# Patient Record
Sex: Female | Born: 1960 | Race: White | Hispanic: No | Marital: Married | Smoking: Never smoker
Health system: Southern US, Community
[De-identification: ages and names within clinical notes are randomized; demographics above are authoritative.]

## PROBLEM LIST (undated history)

## (undated) DIAGNOSIS — J45909 Unspecified asthma, uncomplicated: Secondary | ICD-10-CM

## (undated) DIAGNOSIS — K219 Gastro-esophageal reflux disease without esophagitis: Secondary | ICD-10-CM

## (undated) DIAGNOSIS — F419 Anxiety disorder, unspecified: Secondary | ICD-10-CM

## (undated) DIAGNOSIS — T7840XA Allergy, unspecified, initial encounter: Secondary | ICD-10-CM

## (undated) DIAGNOSIS — M1990 Unspecified osteoarthritis, unspecified site: Secondary | ICD-10-CM

## (undated) DIAGNOSIS — R002 Palpitations: Secondary | ICD-10-CM

## (undated) DIAGNOSIS — R519 Headache, unspecified: Secondary | ICD-10-CM

## (undated) DIAGNOSIS — M810 Age-related osteoporosis without current pathological fracture: Secondary | ICD-10-CM

## (undated) DIAGNOSIS — R42 Dizziness and giddiness: Secondary | ICD-10-CM

## (undated) DIAGNOSIS — I471 Supraventricular tachycardia, unspecified: Secondary | ICD-10-CM

## (undated) DIAGNOSIS — K589 Irritable bowel syndrome without diarrhea: Secondary | ICD-10-CM

## (undated) DIAGNOSIS — I10 Essential (primary) hypertension: Secondary | ICD-10-CM

## (undated) DIAGNOSIS — M419 Scoliosis, unspecified: Secondary | ICD-10-CM

## (undated) DIAGNOSIS — K5790 Diverticulosis of intestine, part unspecified, without perforation or abscess without bleeding: Secondary | ICD-10-CM

## (undated) DIAGNOSIS — R51 Headache: Secondary | ICD-10-CM

## (undated) HISTORY — DX: Gastro-esophageal reflux disease without esophagitis: K21.9

## (undated) HISTORY — DX: Dizziness and giddiness: R42

## (undated) HISTORY — DX: Unspecified asthma, uncomplicated: J45.909

## (undated) HISTORY — DX: Palpitations: R00.2

## (undated) HISTORY — DX: Age-related osteoporosis without current pathological fracture: M81.0

## (undated) HISTORY — PX: SHOULDER ARTHROSCOPY: SHX128

## (undated) HISTORY — DX: Headache: R51

## (undated) HISTORY — PX: HERNIA REPAIR: SHX51

## (undated) HISTORY — PX: TONSILLECTOMY: SUR1361

## (undated) HISTORY — DX: Scoliosis, unspecified: M41.9

## (undated) HISTORY — DX: Diverticulosis of intestine, part unspecified, without perforation or abscess without bleeding: K57.90

## (undated) HISTORY — DX: Irritable bowel syndrome, unspecified: K58.9

## (undated) HISTORY — DX: Supraventricular tachycardia: I47.1

## (undated) HISTORY — DX: Supraventricular tachycardia, unspecified: I47.10

## (undated) HISTORY — PX: COLONOSCOPY: SHX174

## (undated) HISTORY — DX: Essential (primary) hypertension: I10

## (undated) HISTORY — DX: Allergy, unspecified, initial encounter: T78.40XA

## (undated) HISTORY — DX: Unspecified osteoarthritis, unspecified site: M19.90

## (undated) HISTORY — PX: OTHER SURGICAL HISTORY: SHX169

## (undated) HISTORY — DX: Headache, unspecified: R51.9

## (undated) SURGERY — ESOPHAGOGASTRODUODENOSCOPY (EGD) WITH PROPOFOL
Anesthesia: Monitor Anesthesia Care

---

## 1992-09-08 HISTORY — PX: VEIN SURGERY: SHX48

## 1998-11-05 ENCOUNTER — Other Ambulatory Visit: Admission: RE | Admit: 1998-11-05 | Discharge: 1998-11-05 | Payer: Self-pay | Admitting: Obstetrics and Gynecology

## 1999-11-08 ENCOUNTER — Other Ambulatory Visit: Admission: RE | Admit: 1999-11-08 | Discharge: 1999-11-08 | Payer: Self-pay | Admitting: Obstetrics and Gynecology

## 2000-03-30 ENCOUNTER — Encounter: Admission: RE | Admit: 2000-03-30 | Discharge: 2000-06-28 | Payer: Self-pay | Admitting: Anesthesiology

## 2001-01-04 ENCOUNTER — Other Ambulatory Visit: Admission: RE | Admit: 2001-01-04 | Discharge: 2001-01-04 | Payer: Self-pay | Admitting: Obstetrics and Gynecology

## 2001-09-05 ENCOUNTER — Emergency Department (HOSPITAL_COMMUNITY): Admission: EM | Admit: 2001-09-05 | Discharge: 2001-09-05 | Payer: Self-pay | Admitting: *Deleted

## 2002-01-06 ENCOUNTER — Other Ambulatory Visit: Admission: RE | Admit: 2002-01-06 | Discharge: 2002-01-06 | Payer: Self-pay | Admitting: Obstetrics and Gynecology

## 2003-01-13 ENCOUNTER — Other Ambulatory Visit: Admission: RE | Admit: 2003-01-13 | Discharge: 2003-01-13 | Payer: Self-pay | Admitting: Obstetrics and Gynecology

## 2003-12-27 ENCOUNTER — Other Ambulatory Visit: Admission: RE | Admit: 2003-12-27 | Discharge: 2003-12-27 | Payer: Self-pay | Admitting: Obstetrics and Gynecology

## 2004-01-03 ENCOUNTER — Encounter: Admission: RE | Admit: 2004-01-03 | Discharge: 2004-01-03 | Payer: Self-pay | Admitting: Obstetrics and Gynecology

## 2004-04-09 ENCOUNTER — Ambulatory Visit (HOSPITAL_COMMUNITY): Admission: RE | Admit: 2004-04-09 | Discharge: 2004-04-09 | Payer: Self-pay | Admitting: Family Medicine

## 2004-04-25 ENCOUNTER — Ambulatory Visit (HOSPITAL_COMMUNITY): Admission: RE | Admit: 2004-04-25 | Discharge: 2004-04-25 | Payer: Self-pay | Admitting: Internal Medicine

## 2005-02-18 ENCOUNTER — Other Ambulatory Visit: Admission: RE | Admit: 2005-02-18 | Discharge: 2005-02-18 | Payer: Self-pay | Admitting: Obstetrics and Gynecology

## 2006-01-11 ENCOUNTER — Encounter: Admission: RE | Admit: 2006-01-11 | Discharge: 2006-01-11 | Payer: Self-pay | Admitting: Neurosurgery

## 2006-11-20 ENCOUNTER — Encounter: Admission: RE | Admit: 2006-11-20 | Discharge: 2006-11-20 | Payer: Self-pay | Admitting: Orthopedic Surgery

## 2007-06-18 ENCOUNTER — Encounter (HOSPITAL_COMMUNITY): Admission: RE | Admit: 2007-06-18 | Discharge: 2007-07-18 | Payer: Self-pay | Admitting: Orthopaedic Surgery

## 2007-10-08 ENCOUNTER — Ambulatory Visit: Payer: Self-pay | Admitting: Internal Medicine

## 2011-01-21 NOTE — Letter (Signed)
October 08, 2007    W. Simone Curia, M.D.  [address]  [place], [place] [address]   RE:  Victoria [name], Victoria [name]  MRN:  [id number]  /  DOB:  1961/03/07   Dear Brett Canales:   Thank you for referring Victoria [name] down for EP evaluation of her very  rapid SVT.  As you know, she is very pleasant 50 year old woman with an  8-year history of tachy palpitations and documented SVT now at rate of  up to 216 beats per minute.  She notes that when she was in SVT, she  gets dizzy and lightheaded and has nearly passed out on several  occasions though has never had frank syncope.  She notes chest pain,  shoulder pain, neck pain and shortness of breath with her episodes.  She  has not been on medical therapy to date and she is here today for  additional evaluation. Her exam was fairly unremarkable.  EKG did not  demonstrate any evidence of WPW syndrome.  With all the above I have  discussed treatment options with the patient and have recommended  initial trial of beta blockers and have given her prescription today for  atenolol.  I will plan to see her back in a couple of months for follow-  up to see how she is tolerating her beta blocker therapy and see whether  her SVT has been well controlled.  Ultimately she will be a good,  candidate for catheter ablation but has been trying medical therapy  initially would make the most sense.  Thanks again for referring her.    Sincerely,      Doylene Canning. Ladona Ridgel, MD  Electronically Signed   GWT/MedQ  DD: 10/08/2007  DT: 10/08/2007  Job #: [id number]

## 2011-01-21 NOTE — Assessment & Plan Note (Signed)
[facility]                         ELECTROPHYSIOLOGY OFFICE NOTE   Victoria [name], Victoria [name]                        MRN:          [id number]  DATE:10/08/2007                            DOB:          August 23, 1961    REFERRING PHYSICIAN:  Donna Bernard, M.D.   Victoria [name] is referred today by Dr. Lubertha South for evaluation of very  rapid SVT which has been symptomatic and recurrent.   HISTORY OF PRESENT ILLNESS:  The patient is a very pleasant 50 year old  woman who has a history of tachy palpitations dating back many years.  She actually Dr. Juanito Doom back in 1999 regarding these.  It was unclear  what the etiology of palpitations was at that time though it was thought  to be SVT.  The patient, however, over the last year has had  increasingly frequent episodes typically three to four per year.  Most  most recently, she was seen by the paramedics and was back to be given  adenosine.  She was in SVT at almost 260 beats per minute.  Her episode  spontaneously stopped.  She returns today for additional evaluation.  The patient notes that when she goes into SVT, these episodes typically  last anywhere from 5 minutes to as long as an hour and a half and are  associated with near syncope as well as chest pressure and shortness of  breath.  She feels very poorly after the episodes.  She is not taking  any medications so far.  She notes that she occasionally she has been  able to get them to stop with vagal maneuvers.  Additional past medical  history is notable for tonsillectomy in the remote past. She had  abdominal hernia repair in 1978, right shoulder surgery in 2008.   FAMILY HISTORY:  Is notable for both parents being alive and no specific  heart problems.   MEDICATIONS:  Include Flexeril, Prilosec and ibuprofen.   SOCIAL HISTORY:  The patient is married.  She does not work outside the  home.  She denies tobacco use.  She drinks one cup of caffeinated  beverage per day.  She walks on a regular basis and notes that since she  started walking her palpitations have become less severe.   REVIEW OF SYSTEMS:  Is as noted in the HPI, otherwise all systems  reviewed and negative except for very minimal amount of arthritis and  some GE reflux.   PHYSICAL EXAM:  She is a pleasant well-appearing 50 year old woman in no  acute distress.  Blood pressure was 130/76, the pulse was 88 and  regular, respirations were 18.  The weight was not recorded though she  states she weighs 130 pounds.  HEENT:  Normocephalic and atraumatic.  Pupils equal and round.  Oropharynx moist.  Sclerae anicteric.  NECK:  Revealed no jugular venous distention.  There are no thyromegaly.  Trachea midline.  Carotids are  2+ symmetric.  LUNGS:  Clear bilaterally to auscultation.  No wheezes, rales or rhonchi  are present.  No increased work of breathing.  CARDIOVASCULAR:  Regular rate and  rhythm with normal S1-S2, no murmurs,  rubs, gallops.  PMI was not enlarged nor laterally displaced.  ABDOMINAL:  Soft, nondistended, nontender.  There is no organomegaly.  Bowel sounds present, no rebound or guarding.  EXTREMITIES:  Demonstrated no cyanosis, clubbing or edema.  Pulses were  2+ and symmetric.  NEURO:  Neurologic exam alert and oriented x3, cranial nerves intact.  Strength was 5/5 and symmetric.  SKIN:  Exam was normal.   The EKG demonstrates sinus rhythm with normal axis intervals.  There is  no pre-excitation noted.   IMPRESSION:  Recurrent SVT   DISCUSSION:  The patient had extensive numbers of questions regarding  her SVT and its prognosis.  The risks, benefits, goals and expectations  of catheter ablation were discussed as well as an initial trial of beta-  blockers.  While she is anxious about taking beta blockers, stating that  her mother had problems with these, I have recommend that we try medical  therapy first.  However, with a careful explanation of the  risk of the  of ablation were also was also made to her.  She will consider her  options.     Doylene Canning. Ladona Ridgel, MD  Electronically Signed    GWT/MedQ  DD: 10/08/2007  DT: 10/08/2007  Job #: [id number]   cc:   Donna Bernard, M.D.

## 2011-01-24 NOTE — Procedures (Signed)
[facility]  Patient:    Victoria [name], Victoria [name]                          MRN: [id number] Proc. Date: 04/17/00 Adm. Date:  [id number] Attending:  Thyra Breed CC:         Harvie Junior, M.D.   Procedure Report  PROCEDURE PERFORMED:  Lumbar epidural steroid injection.  DIAGNOSIS:  Lumbar degenerative disk disease with lumbar radiculopathy on the basis of underlying herniated disk at L5-S1.  BRIEF HISTORY:  The patient notes overall improvement.  She has developed a twinge or discomfort in her upper interscapular region and some right leg discomfort, but she is able to stand on her toes which she could not do prior to the epidural.  She feels as though she is at least 95% plus better.  PHYSICAL EXAMINATION:  VITAL SIGNS:  Blood pressure 118/71, heart rate 95, respiratory rate 16 and O2 saturation 99%.  Temperature is 97.1.  Pain level is 3/10.  NEUROLOGIC:  Neuro exam is grossly unchanged.  DESCRIPTION OF PROCEDURE:  After informed consent was obtained, the patient was placed in the sitting position and monitored.  Her back was prepped with Betadine x 3.  A skin wheel was raised at the L4-5 interspace with 1% lidocaine.  A 20-gauge Tuohy needle was introduced to the lumbar epidural space with loss of resistance to preservative-free normal saline.  There was no CSF nor blood.  Medrol 80 mg and 10 ml of preservative-free normal saline was gently injected.  The needle was flushed with preservative-free normal saline and removed intact.  Postprocedure condition is stable.  DISCHARGE INSTRUCTIONS: 1. Resume previous diet. 2. Limitation activities per instruction sheet. 3. Continue on current medications. 4. The patient plans to follow up with Dr. Luiz Blare. DD:  04/17/00 TD:  04/18/00 Job: [id number]

## 2011-01-24 NOTE — Op Note (Signed)
NAME:  Victoria [name], Victoria [name]                           ACCOUNT NO.:  [id number]   MEDICAL RECORD NO.:  [id number]                   PATIENT TYPE:  AMB   LOCATION:  DAY                                  FACILITY:  [facility]   PHYSICIAN:  Lionel December, M.D.                 DATE OF BIRTH:  1960/11/24   DATE OF PROCEDURE:  04/25/2004  DATE OF DISCHARGE:                                 OPERATIVE REPORT   PROCEDURE:  Esophagogastroduodenoscopy.   INDICATIONS:  [name] is a 50 year old Caucasian female who has chronic  heartburn which has responded well to PPI. She also had an episode of melena  recently. Therefore, EGD was advised.   Procedure and risks were reviewed with the patient and informed consent was  obtained.   PREOPERATIVE MEDICATIONS:  Cetacaine spray for pharyngeal topical  anesthesia, fentanyl 50 mg IV, Versed 14 mg in divided doses.   FINDINGS:  Procedure performed in endoscopy suite. The patient's vital signs  and O2 saturations were monitored during the procedure and remained stable.  The patient was placed in left lateral position, and Olympus video scope was  passed via oropharynx without any difficulty into the esophagus.   Esophagus:  Mucosa of the esophagus was normal. There was a noncritical ring  about 3 cm proximal to the GE junction. This was left alone as she does have  any dysphagia. There was no ring or stricture at GE junction. No hernia was  noted.   Stomach:  It was empty and distended very well with insufflation. Folds in  the proximal stomach were normal. Examination of mucosa at body, antrum,  pyloric channel, as well as angularis, fundus, and cardia was normal.   Duodenum:  Examined the bulb. The bulb reveals normal mucosa. Scope was  passed to the second part of the duodenum. Mucosa and folds were normal.  Endoscope was withdrawn. The patient tolerated the procedure well.   FINAL DIAGNOSES:  Noncritical distal esophageal ring which was not  manipulated as  patient does not have dysphagia. The rest of the  esophagogastroduodenoscopy was normal.   RECOMMENDATIONS:  1. She will continue anti-reflux measures and Prilosec as before.  2. Should she have relapse of her diarrhea or left sided abdominal pain, she     would give [place] a call.      ___________________________________________                                            Lionel December, M.D.   NR/MEDQ  D:  04/25/2004  T:  04/25/2004  Job:  [id number]   cc:   Donna Bernard, M.D.  [address]  [place]  [place] [address]  Fax: [phone]

## 2011-01-24 NOTE — Consult Note (Signed)
NAME:  Victoria [name], Victoria [name]                           ACCOUNT NO.:  [id number]   MEDICAL RECORD NO.:  [id number]                   PATIENT TYPE:  OUT   LOCATION:  RAD                                  FACILITY:  [facility]   PHYSICIAN:  Victoria [name], M.D.                 DATE OF BIRTH:  08-Jul-1961   DATE OF CONSULTATION:  04/16/2004  DATE OF DISCHARGE:  04/09/2004                                   CONSULTATION   CONSULTING PHYSICIAN:  Victoria [name], M.D.   REFERRING PHYSICIAN:  Donna [name], M.D.   REASON FOR CONSULTATION:  Lower abdominal pain, bloody diarrhea, and a  recent episode of melena.   HISTORY OF PRESENT ILLNESS:  Victoria [name] is a 50 year old Caucasian female who was  referred through the courtesy of Ms. Victoria [name], F.N.P. at Dr. Enzo Bi [name]'s office.  She was in her usual state of health until eight  days ago.  On the morning of April 08, 2004, she had normal but black stool.  That evening around 8 p.m. she experienced a sharp pain in her right lower  quadrant.  Thirty minutes later, she felt she had an upset stomach.  She had  lower abdominal cramps, and she felt like she had to go to the bathroom.  Her bowels finally moved.  She passed a couple of soft, semi-soft stools.  Her pain became even more intense and described to be contractions.  She  also felt abdominal swelling, pain, and then started to have frank bleeding.  She passed fresh blood and tissue.  She has had multiple bowel movements  during the night.  She had some nausea but no vomiting, fever, or chills.  The next morning, she was seen by Ms. Victoria [name].  She had lab  studies.  Her WBC was mildly elevated at 12.6, H&H were around 13 and 45.2,  platelet count was 284.  Her metabolic-7 was normal.  She was sent over to  the hospital for abdominal pelvic CT.  She had bowel wall thickening  involving the descending colon with slight inflammatory change within the  pericolonic fat.  No diverticula were seen  in this area.  She also had an  incidental finding of small left renal calculus.  She was begun on Cipro,  Levsin, and also given Darvocet-N 100 for pain, and this appointment was  made.  The patient states that over the last few days she has been gradually  getting better.  She has not seen any blood in her stool in about 5-6 days.  The stools are now mushy.  She still has __________  cramps in her lower  abdomen mainly on the left side.  She has been on liquid and/or bland diet.  There is no history of broad antibiotic use or travel abroad.  She gives a  several year history of heartburn which had been gradually getting worse.  She experienced intractable heartburn during both her pregnancies.  About  eight months ago, she was begun on a PPI and has noted significant  improvement.  She used to have dysphagia but this has resolved with acid  suppression.  She has not lost any weight recently.   MEDICATIONS:  1. Cipro 500 mg b.i.d.  2. Levsin/SL q.i.d. p.r.n.  3. Flexeril 10 mg q.h.s.  4. Darvocet-N 100 t.i.d. p.r.n.  5. Prilosec 20 mg p.o. q.a.m.   PAST MEDICAL HISTORY:  1. GERD as above.  2. She had repair of an umbilical hernia when she was 39 or 53-years-old.  3. History of low back pain.  Four years ago she had severe pain radiating     to her left lower extremity.  She lost reflexes.  She had disk     herniation.  She received three epidural injections with improvement.  4. Two years ago she began to have neck pain and was diagnosed with cervical     spondylosis.   ALLERGIES:  None known.   FAMILY HISTORY:  Both parents are in good health.  Father has chronic GERD.  She has one adopted sister who is doing fine.   SOCIAL HISTORY:  She is married.  She has two healthy children.  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  She has never smoked cigarettes or drank alcohol.   PHYSICAL EXAMINATION:  GENERAL:  A pleasant,  well-developed, well-nourished,  Caucasian female who is in no acute distress.  VITAL SIGNS:  She weighs 134.5 pounds.  She is 5 feet 4 inches tall.  Pulse  92 per minute, blood pressure 110/80.  HEENT:  Conjunctivae is pink.  Sclerae is nonicteric.  Oropharyngeal mucosa  is normal.  NECK:  Without masses or thyromegaly.  CARDIAC:  Regular rhythm.  Normal S1, S2.  No murmur or gallop noted.  LUNGS:  Clear to auscultation.  ABDOMEN:  Full.  Bowel sounds are normal.  Palpation reveals a soft abdomen  with mild tenderness in the hypogastric area and LLQ.  No organomegaly or  masses noted.  RECTAL:  Deferred as she had one by Ms. Hoskins recently.  EXTREMITIES:  No clubbing or edema noted.   LABS:  As above.   ASSESSMENT:  [name] has two gastrointestinal issues.  1. Recent onset of crampy abdominal pain with bloody diarrhea.  She has     improved with the Cipro.  A CT suggests thickening to the left or     descending colon.  She does not have any risk factors for ischemic     colitis.  It would either appear to be nonspecific or more likely an     infectious or toxic colitis.  If she continues to improve, she may not     even need further evaluation.  It would be nice to at least have a     definite diagnosis and stool studies might help.  2. Chronic gastroesophageal reflux disease.  She had an episode of tarry     stools several hours prior to onset of her lower abdominal pain and     bloody diarrhea, and I feel that this is a separate issue.  Given that     she has had heartburn for years, EGD would be warranted.   RECOMMENDATIONS:  1. She will go and finish up her Cipro and stay on Levsin.  She can  gradually advance her diet back to a regular diet.  2. Stool studies for WBC and O&P.  Hopefully, she can take samples to the     hospital today or tomorrow morning.  3. She will be scheduled for esophagogastroduodenoscopy next week.  Whether    or not she would need a colonoscopy would  be determined after stool     studies are completed.   We would like to thank Ms. Victoria [name], F.N.P. and Dr. Simone [name]  for the opportunity to participate in the care of this nice lady.      ___________________________________________                                            Victoria [name], M.D.   NR/MEDQ  D:  04/16/2004  T:  04/16/2004  Job:  [id number]   cc:   Victoria [name], F.N.P.   Victoria [name], M.D.  [address]  [place]  [place] [address]  Fax: [phone]   day hospital at [facility]

## 2011-01-24 NOTE — Procedures (Signed)
[facility]  Patient:    Victoria [name]                           MRN: [id number] Proc. Date: 04/09/00 Adm. Date:  [id number] Attending:  Thyra Breed CC:         Harvie Junior, M.D.   Procedure Report  PROCEDURE:  Lumbar epidural steroid injection.  DIAGNOSIS:  Lumbar degenerative disk disease with lumbar radiculopathy on the basis of of herniated disk at L5-S1.  INTERVAL HISTORY:  The patient noted marked improvement overall. She feels very positive about her current situation. She is getting much less discomfort radiating out into her lower extremities.  PHYSICAL EXAMINATION:  VITAL SIGNS:  Blood pressure is 136/75, heart rate is 87, respiratory rate 18, O2 sat was 99%, pain level is 3/10.  NEUROLOGIC:  Unchanged from previously.  DESCRIPTION OF PROCEDURE:  After informed consent was obtained, the patient was placed in the sitting position and monitored. The patients back was prepped with Betadine x 3. A skin wheal was raised at the L4-5 interspace with 1 percent lidocaine. A 20 gauge Tuohy needle was introduced to the lumbar epidural space to loss of resistance to preservative free normal saline. There was no cerebrospinal fluid nor blood. 80 mg of Medrol and 10 ml of preservative free normal saline was gently injected. The needle was flushed with preservative free normal saline and removed intact.  CONDITION POST PROCEDURE:  Stable.  DISCHARGE INSTRUCTIONS:  Resume previous diet. Limitations in activities per instruction sheet. Continue on current medications. Follow-up with me in 7-10 days for repeat injection. DD:  04/09/00 TD:  04/11/00 Job: [id number]

## 2011-01-24 NOTE — Procedures (Signed)
[facility]  Patient:    Victoria [name], Victoria [name]                          MRN: [id number] Proc. Date: 03/30/00 Adm. Date:  [id number] Attending:  Thyra Breed CC:         Harvie Junior, M.D.                           Procedure Report  DIAGNOSIS:  Lumbar degenerative disk disease with associated lumbar radiculopathy on the basis of a herniated disk at L5-S1.  HISTORY:  Victoria [name] is a 50 year old, who was sent to [place] by Dr. Jodi Geralds for a series of lumbar epidural steroid injections.  The patient states, that she was in her usual state of health up until about 4-6 weeks ago, when she was working in her garden and noted that she had the onset of severe lower back discomfort, which did not respond to the usual Celebrex and Skelaxin that she normally takes for her chronic cervical herniated disk.  It seemed quite out of the ordinary.  About 3-4 weeks into it, she was out spraying her roses and felt as though she could not get up.  Dr. Gerda Diss, her primary care physician went ahead and gave her a steroid dose pack.  Three days later, she noted pain radiating out into the left lower extremity along the posterior aspect of the thigh and lateral aspect of the calf.  She also is developing leg cramps.  She did not improve.  She saw Dr. Luiz Blare at Dr. Cathlyn Parsons request and she was MRId, which showed a herniated disk at L5-S1 and mild degenerative disk disease at multiple levels in the lumbar spine.  She was placed on a prednisone dose pack and she has noted improvement.  About two weeks ago, she had stopped physical therapy because her pain was getting worse.  At this point, she was seen by Dr. Jordan Likes, who advocated surgical intervention.  Dr. Luiz Blare went ahead and set her up for  epidurals.  She has progressively improved since that time.  She does have some persistent tingling, predominantly localized to the lateral aspect of the left knee and it has telescoped down  considerably.  At one point, her foot was numb on the plantar aspect.  She continues to have a sense of pulling in her hamstring whenever she twists, but sitting or standing, she has minimal discomfort.  She denied any bowel or bladder incontinence.  She has noted weakness and this seems to be improving.  She is able to do her activities of daily living, but does not feel quite healed yet.  CURRENT MEDICATIONS:  Celebrex, Skelaxin and Flexeril, which she takes p.r.n.  ALLERGIES:  No known drug allergies.  FAMILY HISTORY:  Positive for cancer, coronary artery disease, low-back pain.  PAST SURGICAL HISTORY:  Significant for saphenous vein ligation for varicosities and umbilical hernia repair.  SOCIAL HISTORY:  The patient is a homemaker.  She does not drink alcohol nor smoke cigarettes.  PAST MEDICAL HISTORY:  Significant for supraventricular tachycardia for which she is seeing Dr. Daleen Squibb and asthma, which is more of an exercise-induced asthma.  REVIEW OF SYSTEMS:  Negative for GENERAL, HEAD, EYES, NOSE/MOUTH/THROAT, EARS, PULMONARY, GU, HEMATOLOGIC, ENDOCRINE, CUTANEOUS, PSYCHIATRIC.  She does have a history of asthma in the past and SVT, hence the positive under PULMONARY AND CARDIOVASCULAR.  GI:  Significant  for episodic indigestion. MUSCULOSKELETAL/NEUROLOGIC:  See HPI.  No history of seizures or stroke. ALLERGY:   She is allergic to cats.  EXAMINATION:  VITAL SIGNS:  Blood pressure was 130/77, heart rate 92, respiratory rate 16, O2 saturations 98%, temperature was 98.1.  Pain level is 3 out of 10.  GENERAL:  This is a pleasant female in no acute distress.  She presents with her husband.  EYES:  Extraocular movements intact with conjunctiva and sclera clear.  NOSE:  Patent. Nares ______. Oropharynx was free of lesions.  NECK:  Supple without lymphadenopathy.  LUNGS:  Clear to auscultation and percussion.  HEART:  Regular rate and  rhythm.  BREAST/ABDOMINAL/PELVIC/RECTAL:  Not performed.  BACK EXAMINATION:  Revealed mildly positive straight leg raise sign on the left with no tenderness to percussion over the vertebra.  EXTREMITIES:  No clubbing, cyanosis, or edema with radial pulses and dorsalis pedis pulses 2+ and symmetric.  She did have some varicosities over the lower extremity.  NEUROLOGIC:  The patient was oriented x 4.  Cranial nerves two through 12 were grossly intact.  Deep tendon reflexes were symmetric in the upper extremities, symmetric at the knees, 1+ at the right ankle, absent at the left.  Sensory was significant for attenuated pin prick over the lateral aspect of the left calf posteriorly.  Motor was significant for mild plantar flexion weakness of the great toe.  Coordination was grossly intact.  An MRI showed the herniation as mentioned above.   IMPRESSION: 1. Herniated disk at L5-S1 with associated S1 radiculopathy, which seems to be    improving, but still symptomatic. 2. History of asthma, allergy to cats, supraventricular tachycardia per    primary care physician.  DISPOSITION:  I discussed the potential risks, benefits and limitations of a lumbar epidural steroid injection in detail with the patient and her husband. They wished to proceed.  PROCEDURE:  After informed consent was obtained, the patient was placed in a sitting position and monitored.  Her back was prepped with Betadine x 3.  A skin wheal was raised at the L4-5 interspace with 1% lidocaine.  A 20-gauge Tuohy needle was introduced into the lumbar epidural space to a loss of resistance to preservative-free normal saline.  There was no CSF nor blood. Medrol 80 mg and 10 mL preservative-free normal saline was gently injected. The needle was flushed with preservative-free normal saline and removed intact.  POSTPROCEDURAL CONDITION:  Stable.  DISCHARGE INSTRUCTIONS: 1. Resume previous diet. 2. Limitation of activities  per instruction sheet. 3. Continue on current medications. 4. Follow up with [place] in one two weeks if she continues to be symptomatic.  DD:  03/30/00 TD:  04/01/00 Job: [id number]

## 2012-11-25 ENCOUNTER — Other Ambulatory Visit: Payer: Self-pay | Admitting: *Deleted

## 2012-11-25 DIAGNOSIS — F411 Generalized anxiety disorder: Secondary | ICD-10-CM

## 2012-11-25 MED ORDER — ALPRAZOLAM 0.5 MG PO TABS: 0.5000 mg | ORAL_TABLET | Freq: Three times a day (TID) | ORAL | Status: DC | PRN

## 2013-02-02 ENCOUNTER — Other Ambulatory Visit: Payer: Self-pay | Admitting: *Deleted

## 2013-02-02 MED ORDER — DICYCLOMINE HCL 10 MG PO CAPS: ORAL_CAPSULE | ORAL | Status: DC

## 2013-03-03 ENCOUNTER — Other Ambulatory Visit: Payer: Self-pay | Admitting: Family Medicine

## 2013-03-07 ENCOUNTER — Encounter: Payer: Self-pay | Admitting: *Deleted

## 2013-06-02 ENCOUNTER — Other Ambulatory Visit: Payer: Self-pay | Admitting: Family Medicine

## 2013-06-02 NOTE — Telephone Encounter (Signed)
I'm sorry. We have not seen for over one year. Needs ov first to prescribe a controlled substance like xanax

## 2013-09-06 ENCOUNTER — Ambulatory Visit (INDEPENDENT_AMBULATORY_CARE_PROVIDER_SITE_OTHER): Payer: BC Managed Care – PPO | Admitting: Family Medicine

## 2013-09-06 ENCOUNTER — Encounter: Payer: Self-pay | Admitting: Family Medicine

## 2013-09-06 VITALS — BP 128/82 | Ht 64.0 in | Wt 136.8 lb

## 2013-09-06 DIAGNOSIS — K219 Gastro-esophageal reflux disease without esophagitis: Secondary | ICD-10-CM

## 2013-09-06 DIAGNOSIS — G4700 Insomnia, unspecified: Secondary | ICD-10-CM

## 2013-09-06 DIAGNOSIS — J309 Allergic rhinitis, unspecified: Secondary | ICD-10-CM

## 2013-09-06 DIAGNOSIS — F411 Generalized anxiety disorder: Secondary | ICD-10-CM

## 2013-09-06 DIAGNOSIS — K589 Irritable bowel syndrome without diarrhea: Secondary | ICD-10-CM

## 2013-09-06 MED ORDER — ALPRAZOLAM 0.5 MG PO TABS: ORAL_TABLET | ORAL | Status: DC

## 2013-09-06 MED ORDER — DICYCLOMINE HCL 10 MG PO CAPS: ORAL_CAPSULE | ORAL | Status: DC

## 2013-09-06 MED ORDER — ALPRAZOLAM 0.5 MG PO TABS: 0.5000 mg | ORAL_TABLET | Freq: Three times a day (TID) | ORAL | Status: DC | PRN

## 2013-09-06 MED ORDER — FLUTICASONE PROPIONATE 50 MCG/ACT NA SUSP: NASAL | Status: DC

## 2013-09-06 NOTE — Progress Notes (Signed)
   Subjective:    Patient ID: Victoria [name], female    DOB: Nov 20, 1960, 52 y.o.   MRN: [id number]  HPI Patient is here today to get a refill on her meds.   Anxiety helped by the xanax. Takes the med as needed, vertigo hits extremely hard  Still experiencing the vertigo at times, and the xanax helps. Helped some  Steroid nsal spray seems to help some,,  Patient significant chronic problem with reflux. Omeprazole definitely helps.  Also gives positive history of IBS. Multiple scopes in the past by the GI folks. Uses a bit until on a when necessary basis. And it definitely helps. Review of Systems No headache no abdominal pain currently no change in bowel habits no blood in stool ROS otherwise negative    Objective:   Physical Exam Alert no apparent distress. HEENT normal. Vital stable. Lungs clear. Heart regular rate and rhythm.       Assessment & Plan:  Impression 1 intermittent vertigo severe times. Helped by the Xanax. #2 chronic anxiety. #3 insomnia definite benefit from the Xanax per patient. #4 irritable bowel syndrome. #5 reflux. #6 allergic rhinitis plan Xanax refilled. Been so refilled. Flonase refilled. Maintain omeprazole. Diet exercise discussed in encourage. Check every 6 months. Nearly 30 minutes spent with the patient most in discussion, at the end of the visit she brought up chronic hydrocodone use via her Worker's Comp. doctor. I advised patient she would need a separate visit to discuss our maintaining this. WSL

## 2013-09-11 DIAGNOSIS — K589 Irritable bowel syndrome without diarrhea: Secondary | ICD-10-CM | POA: Insufficient documentation

## 2013-09-11 DIAGNOSIS — J309 Allergic rhinitis, unspecified: Secondary | ICD-10-CM | POA: Insufficient documentation

## 2013-09-11 DIAGNOSIS — K219 Gastro-esophageal reflux disease without esophagitis: Secondary | ICD-10-CM | POA: Insufficient documentation

## 2013-09-11 DIAGNOSIS — G4700 Insomnia, unspecified: Secondary | ICD-10-CM | POA: Insufficient documentation

## 2013-09-11 DIAGNOSIS — F411 Generalized anxiety disorder: Secondary | ICD-10-CM | POA: Insufficient documentation

## 2013-09-26 ENCOUNTER — Encounter: Payer: Self-pay | Admitting: Family Medicine

## 2013-09-26 ENCOUNTER — Ambulatory Visit (INDEPENDENT_AMBULATORY_CARE_PROVIDER_SITE_OTHER): Payer: BC Managed Care – PPO | Admitting: Family Medicine

## 2013-09-26 VITALS — BP 112/80 | Temp 98.8°F | Ht 64.0 in | Wt 136.0 lb

## 2013-09-26 DIAGNOSIS — J111 Influenza due to unidentified influenza virus with other respiratory manifestations: Secondary | ICD-10-CM

## 2013-09-26 MED ORDER — OSELTAMIVIR PHOSPHATE 75 MG PO CAPS: 75.0000 mg | ORAL_CAPSULE | Freq: Two times a day (BID) | ORAL | Status: DC

## 2013-09-26 MED ORDER — ALBUTEROL SULFATE HFA 108 (90 BASE) MCG/ACT IN AERS: 2.0000 | INHALATION_SPRAY | Freq: Four times a day (QID) | RESPIRATORY_TRACT | Status: DC | PRN

## 2013-09-26 MED ORDER — HYDROCODONE-HOMATROPINE 5-1.5 MG/5ML PO SYRP: ORAL_SOLUTION | ORAL | Status: DC

## 2013-09-26 NOTE — Progress Notes (Signed)
   Subjective:    Patient ID: Victoria [name], female    DOB: 1961/09/06, 53 y.o.   MRN: [id number]  Cough This is a new problem. The current episode started yesterday. Associated symptoms include wheezing. Associated symptoms comments: Chest tightness. Treatments tried: sudafed.   Felt bad with the cough  Took sudafed, some wheeziness to breathing def feels tight  inflammed airways  Energy tired  Appetite diminished   Did get flu vaccine Daughter with acute onset of all classic symptoms of flu  Review of Systems  Respiratory: Positive for cough and wheezing.    no vomiting diarrhea no chest pain ROS otherwise negative     Objective:   Physical Exam Alert hydration good. Talkative. Afebrile. Vitals reviewed. Moderate malaise. HEENT moderate nasal congestion. Neck supple. Lungs clear. Heart regular rate and rhythm.       Assessment & Plan:  Impression flu discussed plan Tamiflu twice a day 5 days. Hycodan when necessary for cough. Albuterol for any wheezing element. Symptomatic care discussed. WSL

## 2013-09-30 ENCOUNTER — Telehealth: Payer: Self-pay | Admitting: Family Medicine

## 2013-09-30 MED ORDER — AMOXICILLIN-POT CLAVULANATE 875-125 MG PO TABS: 1.0000 | ORAL_TABLET | Freq: Two times a day (BID) | ORAL | Status: DC

## 2013-09-30 NOTE — Telephone Encounter (Signed)
augmentin 875 bid ten d 

## 2013-09-30 NOTE — Telephone Encounter (Signed)
Med sent and pt notified  

## 2013-09-30 NOTE — Telephone Encounter (Signed)
Patient was in Monday and was diagnosed with the flu. She said she feels like it has settled into sinus and head. She has a lot of pressure in head/sinuses. She is thinking it may be a sinus infection at this point. Can you call an antibiotic in?   [facility] [place]

## 2014-03-08 ENCOUNTER — Telehealth: Payer: Self-pay | Admitting: Family Medicine

## 2014-03-08 NOTE — Telephone Encounter (Signed)
Notified patient sorry cannot call antibiotics in for self diagnosed infections, ntbs. Patient verbalized understanding.

## 2014-03-08 NOTE — Telephone Encounter (Signed)
Pt calling to say that she is having a sinus infection currently with  Headache, popping in the ears, pain around her eyes  States you have called this in for her before since she gets them frequently  Advised that this was not our practice any further an she may need to make an  appt for tomorrow   [facility]

## 2014-03-08 NOTE — Telephone Encounter (Signed)
Sorry cannot call antibiotics in for self diagnosed infections, ntbs

## 2014-03-17 ENCOUNTER — Encounter: Payer: Self-pay | Admitting: Family Medicine

## 2014-03-17 ENCOUNTER — Ambulatory Visit (INDEPENDENT_AMBULATORY_CARE_PROVIDER_SITE_OTHER): Payer: BC Managed Care – PPO | Admitting: Family Medicine

## 2014-03-17 VITALS — BP 110/74 | Temp 98.4°F | Ht 64.0 in | Wt 135.1 lb

## 2014-03-17 DIAGNOSIS — I471 Supraventricular tachycardia: Secondary | ICD-10-CM

## 2014-03-17 DIAGNOSIS — Z79899 Other long term (current) drug therapy: Secondary | ICD-10-CM

## 2014-03-17 DIAGNOSIS — Z0189 Encounter for other specified special examinations: Secondary | ICD-10-CM

## 2014-03-17 DIAGNOSIS — R5381 Other malaise: Secondary | ICD-10-CM

## 2014-03-17 DIAGNOSIS — F411 Generalized anxiety disorder: Secondary | ICD-10-CM

## 2014-03-17 DIAGNOSIS — R42 Dizziness and giddiness: Secondary | ICD-10-CM

## 2014-03-17 DIAGNOSIS — R5383 Other fatigue: Principal | ICD-10-CM

## 2014-03-17 MED ORDER — HYDROCODONE-ACETAMINOPHEN 5-325 MG PO TABS
1.0000 | ORAL_TABLET | Freq: Four times a day (QID) | ORAL | Status: DC | PRN
Start: 2014-03-17 — End: 2014-10-31

## 2014-03-17 MED ORDER — ALPRAZOLAM 0.5 MG PO TABS: ORAL_TABLET | ORAL | Status: DC

## 2014-03-17 MED ORDER — CEFPROZIL 500 MG PO TABS: 500.0000 mg | ORAL_TABLET | Freq: Two times a day (BID) | ORAL | Status: DC

## 2014-03-17 MED ORDER — CYCLOBENZAPRINE HCL 10 MG PO TABS: 10.0000 mg | ORAL_TABLET | Freq: Three times a day (TID) | ORAL | Status: DC | PRN

## 2014-03-17 NOTE — Progress Notes (Addendum)
   Subjective:    Patient ID: Arvid RightJan M Younglove, female    DOB: 24-Apr-1961, 53 y.o.   MRN: [id number]  Sinusitis This is a new problem. The current episode started 1 to 4 weeks ago. The problem has been gradually worsening since onset. There has been no fever. Her pain is at a severity of 10/10. The pain is moderate. Associated symptoms include headaches and sinus pressure. (Dizziness) Treatments tried: sudafed, nasal spray  The treatment provided no relief.  3 weeks ago with pressure and dizzy feeling, head with pressure as well Feels pulsation in the head, some hot flashes Feels a little short winded No wheeze Some spells of light headed and feels like she could even pass out At times heart races No numbness or tingling Low energy as well Not coughing up any phlegm  Patient states that she has fatigue and feeling jittery. Patient has a sore under his tongue. Patient wants refills on her hydrocodone and flexeril. Patient states she has no other concerns at this time.    Review of Systems  HENT: Positive for sinus pressure.   Neurological: Positive for headaches.  no fevers no cough 25 minutes was spent with patient    Objective:   Physical Exam  Vitals reviewed. Constitutional: She appears well-nourished. No distress.  Cardiovascular: Regular rhythm and normal heart sounds.   No murmur heard. Tachycardic, rate approximately 110  Pulmonary/Chest: Effort normal and breath sounds normal. No respiratory distress.  Musculoskeletal: She exhibits no edema.  Lymphadenopathy:    She has no cervical adenopathy.  Neurological: She is alert. She exhibits normal muscle tone.  Psychiatric: Her behavior is normal.          Assessment & Plan:  Tachycardia-check thyroid function. Patient states she had history of this that one time her heart was running fast enough to where they recommended her seeing a cardiologist who in turn recommended that she have a Coblation she never got that done she  states if her heart ever runs real fast she purposely does Valsalva maneuver and it comes down. EKG was done that shows sinus tachycardia there is no atrophic on this EKG  Dizzy spells with intermittent headed spells as well it is hard to discern what is causing this. Patient is under a lot of stress she denies that this is a panic attack. Blood pressure was taken and was adequate. Neurologic exam normal finger to nose normal Romberg negative. If it persists may need referral to neurology  History of anxiety refills of Xanax given per request she denies abusing it  Fatigue tiredness we will go ahead and do lab work await the results  More than likely will have patient followup in several weeks

## 2014-03-20 ENCOUNTER — Telehealth: Payer: Self-pay | Admitting: Family Medicine

## 2014-03-20 DIAGNOSIS — N951 Menopausal and female climacteric states: Secondary | ICD-10-CM

## 2014-03-20 LAB — BASIC METABOLIC PANEL
BUN: 14 mg/dL (ref 6–23)
CALCIUM: 9.1 mg/dL (ref 8.4–10.5)
CO2: 29 meq/L (ref 19–32)
Chloride: 105 mEq/L (ref 96–112)
Creat: 0.73 mg/dL (ref 0.50–1.10)
Glucose, Bld: 93 mg/dL (ref 70–99)
Potassium: 4.3 mEq/L (ref 3.5–5.3)
Sodium: 140 mEq/L (ref 135–145)

## 2014-03-20 LAB — CBC WITH DIFFERENTIAL/PLATELET
BASOS PCT: 0 % (ref 0–1)
Basophils Absolute: 0 10*3/uL (ref 0.0–0.1)
Eosinophils Absolute: 0.1 10*3/uL (ref 0.0–0.7)
Eosinophils Relative: 2 % (ref 0–5)
HEMATOCRIT: 42.7 % (ref 36.0–46.0)
HEMOGLOBIN: 14.6 g/dL (ref 12.0–15.0)
Lymphocytes Relative: 33 % (ref 12–46)
Lymphs Abs: 1.7 10*3/uL (ref 0.7–4.0)
MCH: 33.3 pg (ref 26.0–34.0)
MCHC: 34.2 g/dL (ref 30.0–36.0)
MCV: 97.3 fL (ref 78.0–100.0)
MONO ABS: 0.6 10*3/uL (ref 0.1–1.0)
MONOS PCT: 12 % (ref 3–12)
NEUTROS ABS: 2.7 10*3/uL (ref 1.7–7.7)
Neutrophils Relative %: 53 % (ref 43–77)
Platelets: 216 10*3/uL (ref 150–400)
RBC: 4.39 MIL/uL (ref 3.87–5.11)
RDW: 13 % (ref 11.5–15.5)
WBC: 5 10*3/uL (ref 4.0–10.5)

## 2014-03-20 LAB — LIPID PANEL
Cholesterol: 212 mg/dL — ABNORMAL HIGH (ref 0–200)
HDL: 67 mg/dL (ref >39)
LDL Cholesterol: 126 mg/dL — ABNORMAL HIGH (ref 0–99)
TRIGLYCERIDES: 96 mg/dL (ref <150)
Total CHOL/HDL Ratio: 3.2 Ratio
VLDL: 19 mg/dL (ref 0–40)

## 2014-03-20 NOTE — Telephone Encounter (Signed)
Add FSH/LH. Do to perimenopausal symptoms

## 2014-03-20 NOTE — Telephone Encounter (Signed)
Orders put in pt notified.

## 2014-03-20 NOTE — Telephone Encounter (Signed)
Patient said that she is fasting and plans to go to get her blood work done here in the next hour and would like to know if there is any way we can add on to have her hormone levels checked. [facility] [address] So does she need another paper order or can we call and add it? Please advise ASAP.

## 2014-03-21 LAB — VITAMIN D 25 HYDROXY (VIT D DEFICIENCY, FRACTURES): Vit D, 25-Hydroxy: 51 ng/mL (ref 30–89)

## 2014-03-21 LAB — FOLLICLE STIMULATING HORMONE: FSH: 47.6 m[IU]/mL

## 2014-03-21 LAB — LUTEINIZING HORMONE: LH: 23.4 m[IU]/mL

## 2014-03-21 LAB — TSH: TSH: 2.534 u[IU]/mL (ref 0.350–4.500)

## 2014-03-21 LAB — VITAMIN B12: VITAMIN B 12: 998 pg/mL — AB (ref 211–911)

## 2014-03-21 LAB — T4, FREE: Free T4: 1.16 ng/dL (ref 0.80–1.80)

## 2014-03-22 NOTE — Progress Notes (Signed)
Patient notified and verbalized understanding. 

## 2014-03-28 ENCOUNTER — Encounter: Payer: Self-pay | Admitting: Family Medicine

## 2014-05-10 ENCOUNTER — Other Ambulatory Visit (INDEPENDENT_AMBULATORY_CARE_PROVIDER_SITE_OTHER): Payer: Self-pay | Admitting: Otolaryngology

## 2014-05-10 ENCOUNTER — Ambulatory Visit
Admission: RE | Admit: 2014-05-10 | Discharge: 2014-05-10 | Disposition: A | Discharge status: Home / Self Care | Payer: BC Managed Care – PPO | Source: Ambulatory Visit | Attending: Otolaryngology | Admitting: Otolaryngology

## 2014-05-10 DIAGNOSIS — J328 Other chronic sinusitis: Secondary | ICD-10-CM

## 2014-05-18 ENCOUNTER — Other Ambulatory Visit: Payer: Self-pay | Admitting: Family Medicine

## 2014-05-24 ENCOUNTER — Other Ambulatory Visit: Payer: Self-pay | Admitting: Family Medicine

## 2014-05-24 NOTE — Telephone Encounter (Signed)
Last seen 03/17/14

## 2014-06-12 ENCOUNTER — Encounter: Payer: Self-pay | Admitting: Family Medicine

## 2014-06-21 ENCOUNTER — Telehealth: Payer: Self-pay | Admitting: Family Medicine

## 2014-06-21 DIAGNOSIS — R51 Headache: Principal | ICD-10-CM

## 2014-06-21 DIAGNOSIS — R519 Headache, unspecified: Secondary | ICD-10-CM

## 2014-06-21 NOTE — Telephone Encounter (Signed)
Notified patient Dr. Brett CanalesSteve agreed with ent. Patient verbalized understanding.

## 2014-06-21 NOTE — Telephone Encounter (Signed)
ntsw i think dr lewwitt is a very good specialist for chronic ha's , thorough and helpful

## 2014-06-21 NOTE — Telephone Encounter (Signed)
i agree with ent

## 2014-06-21 NOTE — Telephone Encounter (Signed)
Patient seen Dr. Lorin PicketScott for severe headaches at one point and pressure in head.  Went to ENT and had catscan of sinuses and it turned out okay.  They are recommending a neurologist for what could possible be migraines.  She wants to know if we can recommend a neurologist.  The ENT mentioned Dr. Vela ProseLewitt who specializes in headaches and wants to know how we feel about him?

## 2014-06-21 NOTE — Telephone Encounter (Signed)
Pt states Dr. Neale BurlyFreeman told her to take nothing for her headaches. No nasal spray or any meds. She has not had anything in two months. When she saw ENT he told her it would be ok to take advil, aleve, and flonase. Pt would like to know if you think it is ok for her to take.

## 2014-06-27 ENCOUNTER — Ambulatory Visit: Payer: BC Managed Care – PPO | Admitting: Cardiovascular Disease

## 2014-06-29 ENCOUNTER — Ambulatory Visit (INDEPENDENT_AMBULATORY_CARE_PROVIDER_SITE_OTHER): Payer: BC Managed Care – PPO | Admitting: Cardiovascular Disease

## 2014-06-29 ENCOUNTER — Encounter: Payer: Self-pay | Admitting: Cardiovascular Disease

## 2014-06-29 VITALS — BP 142/84 | HR 86 | Ht 64.0 in | Wt 135.0 lb

## 2014-06-29 DIAGNOSIS — L819 Disorder of pigmentation, unspecified: Secondary | ICD-10-CM

## 2014-06-29 DIAGNOSIS — R208 Other disturbances of skin sensation: Secondary | ICD-10-CM

## 2014-06-29 DIAGNOSIS — R002 Palpitations: Secondary | ICD-10-CM

## 2014-06-29 DIAGNOSIS — I471 Supraventricular tachycardia, unspecified: Secondary | ICD-10-CM

## 2014-06-29 DIAGNOSIS — R030 Elevated blood-pressure reading, without diagnosis of hypertension: Secondary | ICD-10-CM

## 2014-06-29 DIAGNOSIS — IMO0001 Reserved for inherently not codable concepts without codable children: Secondary | ICD-10-CM

## 2014-06-29 DIAGNOSIS — R200 Anesthesia of skin: Secondary | ICD-10-CM

## 2014-06-29 DIAGNOSIS — E7800 Pure hypercholesterolemia, unspecified: Secondary | ICD-10-CM

## 2014-06-29 DIAGNOSIS — R000 Tachycardia, unspecified: Secondary | ICD-10-CM

## 2014-06-29 DIAGNOSIS — Z136 Encounter for screening for cardiovascular disorders: Secondary | ICD-10-CM

## 2014-06-29 NOTE — Progress Notes (Signed)
Patient ID: Victoria [name], female   DOB: 01-04-1961, 53 y.o.   MRN: [id number]       CARDIOLOGY CONSULT NOTE  Patient ID: Victoria [name] MRN: [id number] DOB/AGE: 1961/03/31 53 y.o.  Admit date: (Not on file) Primary Physician Rubbie Battiest, MD  Reason for Consultation: tachycardia and palpitations  HPI: The patient is a 53 year old female with a history of generalized anxiety disorder, headaches, and GERD who has been referred for tachycardia and palpitations. She reportedly used to see Dr. Lovena Le for PSVT and as per an office note by Juanda [facility], she was offered an ablation in the past but she declined. The patient says this was about 4-5 years ago, and her HR had been >200 bpm, and was reportedly told her symptoms would only increase in frequency. She reports they subsided until the past several months. She has been frequently experiencing palpitations associated with shortness of breath, and performs a Valsalva maneuver to halt them. They occur 3-5 times per month. They can occur more frequently when she lies down. She denies associated syncope and leg swelling. She feels a pressure in the xiphisternal region which radiates up into her chest and then into her head. She feels a "head pressures" associated with facial warmth and a "racing sensation" in her chest. She has 1-2 seconds of dizziness associated with presyncope which spontaneously resolves. She mentions a prior h/o severe vertigo roughly 4-5 years ago, and could "hardly walk" at that time. She denies a h/o panic attacks. Several labs including TSH, free T4, vitamin D, FSH, LH, BMET, and CBC were all checked and found to be normal. She was told she was perimenopausal and started on hormones and has had less headaches in the past week. She walks several miles each day and sometimes gets relief of symptoms, but sometimes not.  She had her sinuses evaluated and was told they were normal. She had tried antibiotics and steroids without any  significant relief. She saw a headache specialist and was reportedly told that he was not confident she was actually experiencing migraines (Dr. Domingo Cocking). She cannot find any potential triggers in her diet. She has also had times when her right big toe turns blue and becomes numb. She experiences palpitations when she bends her thorax forward. Her mother may have had both rheumatoid arthritis and Raynaud's disease. Her HR in the recent past has been 110-120 bpm, both at PCP's office and when she takes her own pulse. She lost her mother in April of this year has had a number of stressors. She also takes care of her father who has CAD, severe osteoporosis and scoliosis, and who is also my patient. ECG performed in the office today demonstrates normal sinus rhythm, heart rate 92 beats per minute. Lipid panel on 03/20/2014 demonstrated total cholesterol 212, LDL 126, HDL 67, triglycerides 96.  Soc: Married. Husband owns used Agricultural consultant, former Pharmacist, community for [employer]. Son is a Software engineer but running a YouTube channel for Pharmacologist. Daughter graduating from [employer]. Nonsmoker.  Fam: Father had CABG. Mother had MI in late 35's and also had rheumatoid arthritis and Raynaud's disease.     No Known Allergies  Current Outpatient Prescriptions  Medication Sig Dispense Refill  . albuterol (PROVENTIL HFA;VENTOLIN HFA) 108 (90 BASE) MCG/ACT inhaler Inhale 2 puffs into the lungs every 6 (six) hours as needed for wheezing or shortness of breath.  18 g  0  . ALPRAZolam (XANAX) 0.5 MG tablet Take 1 tablet  at bedtime and 1/2 tablet during the day as needed.  45 tablet  5  . cyclobenzaprine (FLEXERIL) 10 MG tablet Take 1 tablet (10 mg total) by mouth 3 (three) times daily as needed for muscle spasms.  40 tablet  2  . dicyclomine (BENTYL) 10 MG capsule Take 10 mg by mouth as needed. Take one TID      . HYDROcodone-acetaminophen (NORCO/VICODIN) 5-325 MG per tablet Take 1 tablet by  mouth every 6 (six) hours as needed for moderate pain.  35 tablet  0  . Lidocaine-Hydrocortisone Ace 3-0.5 % KIT APPLY RECTALLY TWICE DAILY AS DIRECTED FOR 7 DAYS  1 each  11  . metroNIDAZOLE (METROGEL) 1 % gel APPLY TO AFFECTED AREAS OF FACE EVERY NIGHT AT BEDTIME  55 g  0  . Multiple Vitamin (MULTIVITAMIN) tablet Take 1 tablet by mouth daily.      Marland Kitchen omeprazole (PRILOSEC OTC) 20 MG tablet Take 20 mg by mouth daily.      . polyethylene glycol (MIRALAX / GLYCOLAX) packet Take 17 g by mouth daily.      . Probiotic Product (RESTORA PO) Take by mouth.       No current facility-administered medications for this visit.    Past Medical History  Diagnosis Date  . Asthma     Past Surgical History  Procedure Laterality Date  . Colonoscopy      History   Social History  . Marital Status: Married    Spouse Name: N/A    Number of Children: N/A  . Years of Education: N/A   Occupational History  . Not on file.   Social History Main Topics  . Smoking status: Never Smoker   . Smokeless tobacco: Never Used  . Alcohol Use: Not on file  . Drug Use: Not on file  . Sexual Activity: Not on file   Other Topics Concern  . Not on file   Social History Narrative  . No narrative on file     No family history of premature CAD in 1st degree relatives.  Prior to Admission medications   Medication Sig Start Date End Date Taking? Authorizing Provider  albuterol (PROVENTIL HFA;VENTOLIN HFA) 108 (90 BASE) MCG/ACT inhaler Inhale 2 puffs into the lungs every 6 (six) hours as needed for wheezing or shortness of breath. 09/26/13  Yes Mikey Kirschner, MD  ALPRAZolam Duanne Moron) 0.5 MG tablet Take 1 tablet at bedtime and 1/2 tablet during the day as needed. 03/17/14  Yes Kathyrn Drown, MD  cyclobenzaprine (FLEXERIL) 10 MG tablet Take 1 tablet (10 mg total) by mouth 3 (three) times daily as needed for muscle spasms. 03/17/14  Yes Kathyrn Drown, MD  dicyclomine (BENTYL) 10 MG capsule Take 10 mg by mouth as  needed. Take one TID 09/06/13 09/06/14 Yes Mikey Kirschner, MD  HYDROcodone-acetaminophen (NORCO/VICODIN) 5-325 MG per tablet Take 1 tablet by mouth every 6 (six) hours as needed for moderate pain. 03/17/14  Yes Kathyrn Drown, MD  Lidocaine-Hydrocortisone Ace 3-0.5 % KIT APPLY RECTALLY TWICE DAILY AS DIRECTED FOR 7 DAYS 05/24/14  Yes Mikey Kirschner, MD  metroNIDAZOLE (METROGEL) 1 % gel APPLY TO AFFECTED AREAS OF FACE EVERY NIGHT AT BEDTIME 05/18/14  Yes Nilda Simmer, NP  Multiple Vitamin (MULTIVITAMIN) tablet Take 1 tablet by mouth daily.   Yes Historical Provider, MD  omeprazole (PRILOSEC OTC) 20 MG tablet Take 20 mg by mouth daily.   Yes Historical Provider, MD  polyethylene glycol (MIRALAX / GLYCOLAX)  packet Take 17 g by mouth daily.   Yes Historical Provider, MD  Probiotic Product (RESTORA PO) Take by mouth.   Yes Historical Provider, MD     Review of systems complete and found to be negative unless listed above in HPI     Physical exam Blood pressure 142/84, pulse 86, height 5' 4"  (1.626 m), weight 135 lb (61.236 kg). General: NAD Neck: No JVD, no thyromegaly or thyroid nodule.  Lungs: Clear to auscultation bilaterally with normal respiratory effort. CV: Nondisplaced PMI. Regular rate and rhythm, normal S1/S2, no S3/S4, no murmur.  Mild chest wall tenderness. No peripheral edema.  No carotid bruit.  Normal pedal pulses.  Abdomen: Soft, nontender, no hepatosplenomegaly, no distention.  Skin: Intact without lesions or rashes.  Neurologic: Alert and oriented x 3.  Psych: Normal affect. Extremities: No clubbing or cyanosis.  HEENT: Normal.   ECG: Most recent ECG reviewed.  Labs:   Lab Results  Component Value Date   WBC 5.0 03/20/2014   HGB 14.6 03/20/2014   HCT 42.7 03/20/2014   MCV 97.3 03/20/2014   PLT 216 03/20/2014   No results found for this basename: NA, K, CL, CO2, BUN, CREATININE, CALCIUM, LABALBU, PROT, BILITOT, ALKPHOS, ALT, AST, GLUCOSE,  in the last 168  hours No results found for this basename: CKTOTAL, CKMB, CKMBINDEX, TROPONINI    Lab Results  Component Value Date   CHOL 212* 03/20/2014   Lab Results  Component Value Date   HDL 67 03/20/2014   Lab Results  Component Value Date   LDLCALC 126* 03/20/2014   Lab Results  Component Value Date   TRIG 96 03/20/2014   Lab Results  Component Value Date   CHOLHDL 3.2 03/20/2014   No results found for this basename: LDLDIRECT         Studies: No results found.  ASSESSMENT AND PLAN:  1. Tachycardia and palpitations with prior h/o PSVT: I had recommended at 30-day event monitor to see if she has developed a recurrence of PSVT, or if this represents inappropriate sinus tachycardia. She would prefer to wear a shorter one, and has agreed to a 7-day event monitor. Will obtain an echocardiogram to evaluate for structural heart disease. No murmurs or abnormal CV findings on exam. She prefers not to use medications and I will wait until monitoring is completed before deciding upon medical therapy. I have asked her to keep a journal of her symptoms to try and identify potential provoking factors. 2. Hyperlipidemia: Given her absence of risk factors and elevated HDL, no medical therapy is warranted at this time. However, if BP becomes persistently elevated, cholesterol-reducing medications could be considered in the future. 10-year ASCVD risk is currently 1.4%. 3. Elevated BP: She reports it is usually normal to low. Will continue to monitor. 4. Toe discoloration and numbness: While this may represent benign acrocyanosis, she may have some type of connective tissue disease and may be displaying a Raynaud's phenomenon, especially given her mother's history. I have asked her to see if there is any association with cold temperatures.  Dispo: f/u 3 weeks.  Signed: Kate Sable, M.D., F.A.C.C.  06/29/2014, 2:01 PM

## 2014-06-29 NOTE — Patient Instructions (Signed)
Your physician has requested that you have an echocardiogram. Echocardiography is a painless test that uses sound waves to create images of your heart. It provides your doctor with information about the size and shape of your heart and how well your heart's chambers and valves are working. This procedure takes approximately one hour. There are no restrictions for this procedure. Your physician has recommended that you wear a 7 day event monitor. Event monitors are medical devices that record the heart's electrical activity. Doctors most often us these monitors to diagnose arrhythmias. Arrhythmias are problems with the speed or rhythm of the heartbeat. The monitor is a small, portable device. You can wear one while you do your normal daily activities. This is usually used to diagnose what is causing palpitations/syncope (passing out). Office will contact with results via phone or letter.   Continue all current medications. Follow up in  3 weeks

## 2014-07-04 ENCOUNTER — Other Ambulatory Visit: Payer: Self-pay | Admitting: *Deleted

## 2014-07-04 DIAGNOSIS — I471 Supraventricular tachycardia: Secondary | ICD-10-CM

## 2014-07-04 DIAGNOSIS — R002 Palpitations: Secondary | ICD-10-CM

## 2014-07-05 ENCOUNTER — Other Ambulatory Visit (HOSPITAL_COMMUNITY): Payer: Self-pay | Admitting: *Deleted

## 2014-07-05 DIAGNOSIS — R002 Palpitations: Secondary | ICD-10-CM

## 2014-07-06 ENCOUNTER — Telehealth: Payer: Self-pay | Admitting: *Deleted

## 2014-07-06 ENCOUNTER — Other Ambulatory Visit (INDEPENDENT_AMBULATORY_CARE_PROVIDER_SITE_OTHER): Payer: BC Managed Care – PPO

## 2014-07-06 ENCOUNTER — Other Ambulatory Visit: Payer: Self-pay

## 2014-07-06 ENCOUNTER — Telehealth: Payer: Self-pay | Admitting: Cardiovascular Disease

## 2014-07-06 DIAGNOSIS — R002 Palpitations: Secondary | ICD-10-CM

## 2014-07-06 DIAGNOSIS — I471 Supraventricular tachycardia: Secondary | ICD-10-CM

## 2014-07-06 NOTE — Telephone Encounter (Signed)
Message copied by Vernon PreyBARKER, Viana Sleep T on Thu Jul 06, 2014  3:46 PM ------      Message from: Prentice DockerKONESWARAN, SURESH A      Created: Thu Jul 06, 2014  2:22 PM       Will discuss at f/u visit. Normal pumping function. Good results overall. ------

## 2014-07-06 NOTE — Telephone Encounter (Signed)
Pt made aware, confirmed appt for 11/13

## 2014-07-06 NOTE — Telephone Encounter (Signed)
Wants to know if she needs to move her appt futher out since she isnt going to start wearing her monitor until Monday Nov 2nd

## 2014-07-07 NOTE — Telephone Encounter (Signed)
Returned call to patient.  Informed her to keep appointment as scheduled for 07/21/14.  Only wearing monitor x 7 days.

## 2014-07-09 DIAGNOSIS — R002 Palpitations: Secondary | ICD-10-CM

## 2014-07-21 ENCOUNTER — Ambulatory Visit (INDEPENDENT_AMBULATORY_CARE_PROVIDER_SITE_OTHER): Payer: BC Managed Care – PPO | Admitting: Cardiovascular Disease

## 2014-07-21 ENCOUNTER — Encounter: Payer: Self-pay | Admitting: Cardiovascular Disease

## 2014-07-21 VITALS — BP 117/80 | HR 85 | Ht 64.0 in | Wt 137.0 lb

## 2014-07-21 DIAGNOSIS — R002 Palpitations: Secondary | ICD-10-CM | POA: Insufficient documentation

## 2014-07-21 DIAGNOSIS — L819 Disorder of pigmentation, unspecified: Secondary | ICD-10-CM

## 2014-07-21 DIAGNOSIS — R030 Elevated blood-pressure reading, without diagnosis of hypertension: Secondary | ICD-10-CM

## 2014-07-21 DIAGNOSIS — R208 Other disturbances of skin sensation: Secondary | ICD-10-CM

## 2014-07-21 DIAGNOSIS — I471 Supraventricular tachycardia: Secondary | ICD-10-CM

## 2014-07-21 DIAGNOSIS — R200 Anesthesia of skin: Secondary | ICD-10-CM

## 2014-07-21 DIAGNOSIS — E7800 Pure hypercholesterolemia, unspecified: Secondary | ICD-10-CM

## 2014-07-21 DIAGNOSIS — IMO0001 Reserved for inherently not codable concepts without codable children: Secondary | ICD-10-CM

## 2014-07-21 DIAGNOSIS — R000 Tachycardia, unspecified: Secondary | ICD-10-CM | POA: Insufficient documentation

## 2014-07-21 DIAGNOSIS — Z136 Encounter for screening for cardiovascular disorders: Secondary | ICD-10-CM

## 2014-07-21 NOTE — Patient Instructions (Signed)
Continue all current medications. Follow up in  3 months 

## 2014-07-21 NOTE — Progress Notes (Signed)
Patient ID: Victoria [name], female   DOB: Feb 03, 1961, 52 y.o.   MRN: [id number]      SUBJECTIVE: The patient is here to follow-up on the results of cardiovascular testing performed for the evaluation of tachycardia and palpitations, with a prior history of PSVT. Event monitoring demonstrated normal sinus rhythm with no arrhythmias noted. Echocardiogram demonstrated normal left ventricular systolic function and grade 1 diastolic dysfunction with normal wall thickness and normal regional wall motion. She has been documenting her symptoms since her last visit. She is scheduled to see an allergist as she has been having pressure headaches. She has been spending time at her parents house which may have mold. A brain MRI has also been ordered. She saw an ENT specialist who said her sinuses did not have significant pathology. She has noted her heart rate to be in the 110 beat per minute range at times, but this only lasts seconds. She also told me that after her pregnancy, she underwent upper and lower high saphenous vein ligation. She has not had exertional chest pain or exertional dyspnea. Her palpitations typically occur when she bends forward at the thorax.   In summary, she is a 53 year old female with a history of generalized anxiety disorder, headaches, and GERD who has been referred for tachycardia and palpitations. She reportedly used to see Dr. Lovena Le for PSVT and as per an office note by [facility], she was offered an ablation in the past but she declined. The patient says this was about 4-5 years ago, and her HR had been >200 bpm, and was reportedly told her symptoms would only increase in frequency. She reports they subsided until the past several months. She has been frequently experiencing palpitations associated with shortness of breath, and performs a Valsalva maneuver to halt them. They occur 3-5 times per month. They can occur more frequently when she lies down. She denies associated  syncope and leg swelling. She feels a pressure in the xiphisternal region which radiates up into her chest and then into her head. She feels a "head pressures" associated with facial warmth and a "racing sensation" in her chest. She has 1-2 seconds of dizziness associated with presyncope which spontaneously resolves. She mentions a prior h/o severe vertigo roughly 4-5 years ago, and could "hardly walk" at that time. She denies a h/o panic attacks. Several labs including TSH, free T4, vitamin D, FSH, LH, BMET, and CBC were all checked and found to be normal. She was told she was perimenopausal and started on hormones and has had less headaches in the past week. She walks several miles each day and sometimes gets relief of symptoms, but sometimes not.  She had her sinuses evaluated and was told they were normal. She had tried antibiotics and steroids without any significant relief. She saw a headache specialist and was reportedly told that he was not confident she was actually experiencing migraines (Dr. Domingo Cocking). She cannot find any potential triggers in her diet. She has also had times when her right big toe turns blue and becomes numb. Her mother may have had both rheumatoid arthritis and Raynaud's disease. Her HR in the recent past has been 110-120 bpm, both at PCP's office and when she takes her own pulse. She lost her mother in April of this year has had a number of stressors. She also takes care of her father who has CAD, severe osteoporosis and scoliosis, and who is also my patient. ECG performed at her last visit with me  demonstrated normal sinus rhythm, heart rate 92 beats per minute. Lipid panel on 03/20/2014 demonstrated total cholesterol 212, LDL 126, HDL 67, triglycerides 96.  Soc: Married. Husband owns used Agricultural consultant, former Pharmacist, community for [employer]. Son is a Software engineer but running a YouTube channel for Pharmacologist. Daughter graduating from [employer]. Nonsmoker.  Fam: Father had CABG. Mother had MI in late 55's and also had rheumatoid arthritis and Raynaud's disease.   Review of Systems: As per "subjective", otherwise negative.  No Known Allergies  Current Outpatient Prescriptions  Medication Sig Dispense Refill  . ALPRAZolam (XANAX) 0.5 MG tablet Take 1 tablet at bedtime and 1/2 tablet during the day as needed. 45 tablet 5  . cyclobenzaprine (FLEXERIL) 10 MG tablet Take 1 tablet (10 mg total) by mouth 3 (three) times daily as needed for muscle spasms. 40 tablet 2  . dicyclomine (BENTYL) 10 MG capsule Take 10 mg by mouth as needed. Take one TID    . HYDROcodone-acetaminophen (NORCO/VICODIN) 5-325 MG per tablet Take 1 tablet by mouth every 6 (six) hours as needed for moderate pain. 35 tablet 0  . Lidocaine-Hydrocortisone Ace 3-0.5 % KIT APPLY RECTALLY TWICE DAILY AS DIRECTED FOR 7 DAYS 1 each 11  . metroNIDAZOLE (METROGEL) 1 % gel APPLY TO AFFECTED AREAS OF FACE EVERY NIGHT AT BEDTIME 55 g 0  . Multiple Vitamin (MULTIVITAMIN) tablet Take 1 tablet by mouth daily.    Marland Kitchen omeprazole (PRILOSEC OTC) 20 MG tablet Take 20 mg by mouth daily.    . polyethylene glycol (MIRALAX / GLYCOLAX) packet Take 17 g by mouth daily.    . Probiotic Product (RESTORA PO) Take by mouth.     No current facility-administered medications for this visit.    Past Medical History  Diagnosis Date  . Asthma     Past Surgical History  Procedure Laterality Date  . Colonoscopy      History   Social History  . Marital Status: Married    Spouse Name: N/A    Number of Children: N/A  . Years of Education: N/A   Occupational History  . Not on file.   Social History Main Topics  . Smoking status: Never Smoker   . Smokeless tobacco: Never Used  . Alcohol Use: No  . Drug Use: No  . Sexual Activity:    Partners: Male   Other Topics Concern  . Not on file   Social History Narrative     Filed Vitals:   07/21/14 1455  BP: 117/80  Pulse: 85   Height: 5' 4"  (1.626 m)  Weight: 137 lb (62.143 kg)  SpO2: 99%    PHYSICAL EXAM General: NAD HEENT: Normal. Neck: No JVD, no thyromegaly. Lungs: Clear to auscultation bilaterally with normal respiratory effort. CV: Nondisplaced PMI.  Regular rate and rhythm, normal S1/S2, no S3/S4, no murmur. No pretibial or periankle edema.  No carotid bruit.  Normal pedal pulses.  Abdomen: Soft, nontender, no hepatosplenomegaly, no distention.  Neurologic: Alert and oriented x 3.  Psych: Normal affect. Skin: Normal. Musculoskeletal: Normal range of motion, no gross deformities. Extremities: No clubbing or cyanosis.   ECG: Most recent ECG reviewed.      ASSESSMENT AND PLAN: 1. Tachycardia and palpitations with prior h/o PSVT: 7-day event monitor did not demonstrate any evidence of PSVT or inappropriate sinus tachycardia. Echocardiogram results noted above and found to be unremarkable. She prefers not to use medications even prn, and I think this is reasonable given that her  episodes last only seconds. 2. Hyperlipidemia: Given her absence of risk factors and elevated HDL, no medical therapy is warranted at this time. However, if BP becomes persistently elevated, cholesterol-reducing medications could be considered in the future. 10-year ASCVD risk is currently 1.4%. 3. Elevated BP: Elevated at last visit but normal today. Will continue to monitor. 4. Toe discoloration and numbness: While this may represent benign acrocyanosis, she may have some type of connective tissue disease and may be displaying a Raynaud's phenomenon, especially given her mother's history. I have asked her to see if there is any association with cold temperatures.  Dispo: f/u 3 months.  Time spent: 40 minutes, I which greater than 50% was spent reviewing her symptom log as well as potential etiologies of her headaches as well as palpitations.  Kate Sable, M.D., F.A.C.C.

## 2014-07-24 ENCOUNTER — Other Ambulatory Visit: Payer: Self-pay | Admitting: Neurology

## 2014-07-24 DIAGNOSIS — G4489 Other headache syndrome: Secondary | ICD-10-CM

## 2014-08-01 ENCOUNTER — Other Ambulatory Visit: Payer: Self-pay | Admitting: Nurse Practitioner

## 2014-08-01 NOTE — Telephone Encounter (Signed)
Last seen 03/17/14

## 2014-08-09 ENCOUNTER — Telehealth: Payer: Self-pay | Admitting: *Deleted

## 2014-08-09 NOTE — Telephone Encounter (Signed)
Patient informed during office visit and verbally also.

## 2014-08-09 NOTE — Telephone Encounter (Signed)
-----   Message from Laqueta LindenSuresh A Koneswaran, MD sent at 08/08/2014 11:49 AM EST ----- Normal.

## 2014-08-21 ENCOUNTER — Other Ambulatory Visit: Payer: Self-pay | Admitting: *Deleted

## 2014-10-05 ENCOUNTER — Other Ambulatory Visit: Payer: Self-pay | Admitting: Family Medicine

## 2014-10-06 NOTE — Telephone Encounter (Signed)
Ok plus three monthly ref 

## 2014-10-24 ENCOUNTER — Ambulatory Visit: Payer: BC Managed Care – PPO | Admitting: Cardiovascular Disease

## 2014-10-25 ENCOUNTER — Telehealth (INDEPENDENT_AMBULATORY_CARE_PROVIDER_SITE_OTHER): Payer: Self-pay | Admitting: *Deleted

## 2014-10-25 NOTE — Telephone Encounter (Signed)
Dr. Karilyn Cotaehman [facility] and she has been taking this medicine for years. She is concerned with all the news release about this medicine causing kidney failure and dementia. [name] did try to take it every other day and her heartburn is really bad and had to go back on it daily. Her return phone number is [phone] or [phone].

## 2014-10-25 NOTE — Telephone Encounter (Signed)
Per Dr.Rehmant he patient should take it every other day. Patient will need to make an appointment as she has not been seen since 2012.

## 2014-10-25 NOTE — Telephone Encounter (Signed)
A message was left on the patient's voice mail at home with Dr.Rehman's recommendations.

## 2014-10-31 ENCOUNTER — Ambulatory Visit (INDEPENDENT_AMBULATORY_CARE_PROVIDER_SITE_OTHER): Payer: 59 | Admitting: Internal Medicine

## 2014-10-31 ENCOUNTER — Encounter (INDEPENDENT_AMBULATORY_CARE_PROVIDER_SITE_OTHER): Payer: Self-pay | Admitting: Internal Medicine

## 2014-10-31 VITALS — BP 136/78 | HR 80 | Temp 98.0°F | Ht 64.0 in | Wt 138.6 lb

## 2014-10-31 DIAGNOSIS — K219 Gastro-esophageal reflux disease without esophagitis: Secondary | ICD-10-CM

## 2014-10-31 MED ORDER — OMEPRAZOLE 20 MG PO CPDR: 20.0000 mg | DELAYED_RELEASE_CAPSULE | Freq: Every day | ORAL | Status: DC

## 2014-10-31 NOTE — Progress Notes (Signed)
Subjective:    Patient ID: Victoria [name], female    DOB: 09/07/1961, 54 y.o.   MRN: [id number]  HPI Here today for f/u of GERD. Hx of EGD in 2005. Has been on Prilosec 46m daily x 16 yrs. She is concerned about the side effects of Prilosec. If she tries to stop, she will have acid reflux. She tried taking Prilosec every other day, but she has breakthru acid reflux. Appetite is good. No weight loss. She denies any abdominal pain.  She has a BM daily. She will have at least 4 stools a day. Stools are formed. She takes Miralax daily for this.  She tells me she [place] by her GYN. This year her stool card was negative.     04/25/2004:  Esophagogastroduodenoscopy.  NDICATIONS:  JErzais a 54year old Caucasian female who has chronic  heartburn which has responded well to PPI. She also had an episode of melena  recently. Therefore, EGD was advised.  FINAL DIAGNOSES:  Noncritical distal esophageal ring which was not  manipulated as patient does not have dysphagia. The rest of the  esophagogastroduodenoscopy was normal.   06/17/2009 Colonoscopy. : Reason for exam: colitis: Normal colonoscopy and terminal ileoscopy.    Review of Systems Past Medical History  Diagnosis Date  . Asthma   . GERD (gastroesophageal reflux disease)     Past Surgical History  Procedure Laterality Date  . Colonoscopy      No Known Allergies  Current Outpatient Prescriptions on File Prior to Visit  Medication Sig Dispense Refill  . ALPRAZolam (XANAX) 0.5 MG tablet TAKE 1 TABLET BY MOUTH EVERY NIGHT AT BEDTIME AND TAKE ONE-HALF TABLET BY MOUTH DURING THE DAY AS NEEDED 45 tablet 3  . cyclobenzaprine (FLEXERIL) 10 MG tablet Take 1 tablet (10 mg total) by mouth 3 (three) times daily as needed for muscle spasms. 40 tablet 2  . Lidocaine-Hydrocortisone Ace 3-0.5 % KIT APPLY RECTALLY TWICE DAILY AS DIRECTED FOR 7 DAYS 1 each 11  . Multiple Vitamin (MULTIVITAMIN) tablet Take 1  tablet by mouth daily.    .Marland Kitchenomeprazole (PRILOSEC OTC) 20 MG tablet Take 20 mg by mouth daily.    . polyethylene glycol (MIRALAX / GLYCOLAX) packet Take 17 g by mouth daily.    . Probiotic Product (RESTORA PO) Take by mouth.    . dicyclomine (BENTYL) 10 MG capsule Take 10 mg by mouth as needed. Take one TID    . metroNIDAZOLE (METROGEL) 1 % gel APPLY TO THE AFFECTED AREA ON FACE EVERY NIGHT AT BEDTIME (Patient not taking: Reported on 10/31/2014) 55 g 0   No current facility-administered medications on file prior to visit.        Objective:   Physical Exam  Filed Vitals:   10/31/14 1139  Height: _0  (1.626 m)  Weight: 138 lb 9.6 oz (62.869 kg)  Alert and oriented. Skin warm and dry. Oral mucosa is moist.   . Sclera anicteric, conjunctivae is pink. Thyroid not enlarged. No cervical lymphadenopathy. Lungs clear. Heart regular rate and rhythm.  Abdomen is soft. Bowel sounds are positive. No hepatomegaly. No abdominal masses felt. No tenderness.  No edema to lower extremities.   Stool brown and guaiac negative.    Hemocult  developer: Lot [id number] Exp. Date 2016-05 Card: Lot 02/14 Exp 7/18     Assessment & Plan:  GERD. Controlled at this time.  She is concerned about the risk of fractures.. She is  on a low dose PPI. I advised her to take Calcium every other day.  Patient needs a bone scan. Continue to Prilosec. Rx for Prilosec sent to her pharmacy Take 1/2 of the Miralax daily.  OV in 1 yr.

## 2014-10-31 NOTE — Patient Instructions (Signed)
Continue the Prilosec 20mg  daily.

## 2014-12-08 ENCOUNTER — Telehealth: Payer: Self-pay | Admitting: Family Medicine

## 2014-12-08 MED ORDER — LIDOCAINE HCL 3 % EX CREA: TOPICAL_CREAM | CUTANEOUS | Status: DC

## 2014-12-08 MED ORDER — HYDROCORTISONE 0.5 % EX CREA: TOPICAL_CREAM | CUTANEOUS | Status: DC

## 2014-12-08 MED ORDER — LIDOCAINE-HYDROCORTISONE ACE 3-0.5 % RE CREA: TOPICAL_CREAM | RECTAL | Status: DC

## 2014-12-08 NOTE — Telephone Encounter (Signed)
Pt's Lidocaine-Hydrocortisone Ace 3-0.5 % KIT was DENIED, prior auth form was completed & sent in only to find out that this medication is a "plan exclusion".  Called and explained to pt, she wanted to ask Dr. Richardson Landry if he could send in Rx for the 2 meds separately (in large tubes so she don't need refill soon)   She seems to feel like it's a plan exclusion due to being a compound cream.  Told pt I would ask if this could be done but if it can't be or if it gets denied by insurance then she'd have to pay privately for this, pt understands  [facility] Please call pt when done [phone]

## 2014-12-08 NOTE — Telephone Encounter (Signed)
Per patient request, send in 2 separate creams and send in the compound (w/o the kit) and see which one her insurance will cover.

## 2014-12-08 NOTE — Telephone Encounter (Signed)
Ok lets try

## 2014-12-08 NOTE — Telephone Encounter (Signed)
Last seen 09/06/13

## 2014-12-14 ENCOUNTER — Other Ambulatory Visit: Payer: Self-pay | Admitting: Family Medicine

## 2014-12-20 ENCOUNTER — Other Ambulatory Visit: Payer: Self-pay | Admitting: *Deleted

## 2014-12-20 ENCOUNTER — Telehealth: Payer: Self-pay | Admitting: Family Medicine

## 2014-12-20 MED ORDER — CYCLOBENZAPRINE HCL 10 MG PO TABS: 10.0000 mg | ORAL_TABLET | Freq: Three times a day (TID) | ORAL | Status: DC | PRN

## 2014-12-20 NOTE — Telephone Encounter (Signed)
cyclobenzaprine (FLEXERIL) 10 MG tablet   Pt called to make an appt for a med chck an requested  That we send in at least a 30 day supply please   appt apr 25th   Last ov 03/17/14

## 2014-12-20 NOTE — Telephone Encounter (Signed)
Ref times one 

## 2014-12-20 NOTE — Telephone Encounter (Signed)
Med sent to pharm. Pt notified.  

## 2015-01-01 ENCOUNTER — Ambulatory Visit: Payer: Self-pay | Admitting: Family Medicine

## 2015-03-01 ENCOUNTER — Other Ambulatory Visit: Payer: Self-pay | Admitting: Family Medicine

## 2015-03-13 ENCOUNTER — Telehealth: Payer: Self-pay | Admitting: Family Medicine

## 2015-03-13 MED ORDER — METRONIDAZOLE 0.75 % EX CREA: TOPICAL_CREAM | CUTANEOUS | Status: DC

## 2015-03-13 NOTE — Telephone Encounter (Signed)
Pt was recently called in metronidazole gel 1% pump and ins wont cover that strength but will cover a .75% pump.   [facility]

## 2015-03-13 NOTE — Telephone Encounter (Signed)
Ok let's. Ref 100

## 2015-03-13 NOTE — Telephone Encounter (Signed)
Rx sent electronically to pharmacy. Patient notified. 

## 2015-03-14 ENCOUNTER — Other Ambulatory Visit: Payer: Self-pay | Admitting: Family Medicine

## 2015-03-21 ENCOUNTER — Telehealth: Payer: Self-pay | Admitting: Family Medicine

## 2015-03-21 ENCOUNTER — Other Ambulatory Visit: Payer: Self-pay | Admitting: *Deleted

## 2015-03-21 MED ORDER — CYCLOBENZAPRINE HCL 10 MG PO TABS: 10.0000 mg | ORAL_TABLET | Freq: Three times a day (TID) | ORAL | Status: DC | PRN

## 2015-03-21 NOTE — Telephone Encounter (Signed)
Pt notified on voicemail that rx sent to pharm and that she needs ov before any further refills.

## 2015-03-21 NOTE — Telephone Encounter (Signed)
May ref plus one, let pt know not seen in office over one yr will need to see in future for any rx ref

## 2015-03-21 NOTE — Telephone Encounter (Signed)
cyclobenzaprine (FLEXERIL) 10 MG tablet  Pt needs refill please, having shoulder pain an nothing  To take for it at this time.  [facility]

## 2015-03-22 ENCOUNTER — Telehealth (INDEPENDENT_AMBULATORY_CARE_PROVIDER_SITE_OTHER): Payer: Self-pay | Admitting: *Deleted

## 2015-03-22 NOTE — Telephone Encounter (Signed)
[facility] is needing a Rx for Bentyl. The return phone number is [phone].

## 2015-03-23 ENCOUNTER — Other Ambulatory Visit (INDEPENDENT_AMBULATORY_CARE_PROVIDER_SITE_OTHER): Payer: Self-pay | Admitting: Internal Medicine

## 2015-03-23 MED ORDER — DICYCLOMINE HCL 10 MG PO CAPS: 10.0000 mg | ORAL_CAPSULE | ORAL | Status: DC | PRN

## 2015-03-23 NOTE — Telephone Encounter (Signed)
rx for bentyl sent

## 2015-03-23 NOTE — Telephone Encounter (Signed)
rx sent to her pharmacy 

## 2015-04-05 ENCOUNTER — Encounter: Payer: Self-pay | Admitting: Family Medicine

## 2015-04-05 ENCOUNTER — Ambulatory Visit (INDEPENDENT_AMBULATORY_CARE_PROVIDER_SITE_OTHER): Payer: 59 | Admitting: Family Medicine

## 2015-04-05 VITALS — BP 120/72 | Ht 64.0 in | Wt 137.8 lb

## 2015-04-05 DIAGNOSIS — J309 Allergic rhinitis, unspecified: Secondary | ICD-10-CM | POA: Diagnosis not present

## 2015-04-05 DIAGNOSIS — M25512 Pain in left shoulder: Secondary | ICD-10-CM | POA: Diagnosis not present

## 2015-04-05 DIAGNOSIS — G4700 Insomnia, unspecified: Secondary | ICD-10-CM | POA: Diagnosis not present

## 2015-04-05 DIAGNOSIS — R42 Dizziness and giddiness: Secondary | ICD-10-CM

## 2015-04-05 DIAGNOSIS — K219 Gastro-esophageal reflux disease without esophagitis: Secondary | ICD-10-CM | POA: Diagnosis not present

## 2015-04-05 MED ORDER — ALPRAZOLAM 0.5 MG PO TABS: ORAL_TABLET | ORAL | Status: DC

## 2015-04-05 MED ORDER — HYDROCODONE-ACETAMINOPHEN 5-325 MG PO TABS: 1.0000 | ORAL_TABLET | Freq: Every evening | ORAL | Status: DC | PRN

## 2015-04-05 MED ORDER — DICLOFENAC SODIUM 75 MG PO TBEC: 75.0000 mg | DELAYED_RELEASE_TABLET | Freq: Two times a day (BID) | ORAL | Status: DC

## 2015-04-05 NOTE — Patient Instructions (Signed)
We wi

## 2015-04-05 NOTE — Progress Notes (Signed)
   Subjective:    Patient ID: Victoria [name], female    DOB: 03/30/1961, 54 y.o.   MRN: [id number]  HPI Patient arrives with c/o left shoulder pain for 8 months.  Hx of prior surg [name] should, ehen it tweaks, will use prn flexeril to help the spasm pain  Also uses prn aleave at times  Left shoulder hurting now for six to twelve nmonths   No hx of injury to [name] shoulder  Facial metronidazole def helps, uses it regulaly  aprazolam uses as needed for dizziness and verigo, uses prn, and also at night to help sleep, no obv s e's  Helps pain intermitently   Neurologist for headaches,no dx and went to a headache specialist and eye doc, and had a full wu incl b w, had a brain mri [place], but declined       [place] hurts with certain motion        Review of Systems Occasional headaches. See specialists no chest pain. No shortness of breath no abdominal pain no change in bowel habits    Objective:   Physical Exam  Alert vitals stable blood pressure good HEENT normal neck supple. Lungs clear. Heart rare rhythm. Left shoulder positive deltoid tenderness. Positive impingement sign. Distal strength sensation intact      Assessment & Plan:  Impression 1 progressive shoulder pain with impingement sign 8 months duration and major impact on patient's ability to do what she wants to #2 reflux clinically stable #3 rosacea stable #4 anxiety and insomnia stable with medications plan will medications refilled. Diet exercise discussed. Orthopedic referral. Rationale discussed. WSL

## 2015-04-11 ENCOUNTER — Other Ambulatory Visit: Payer: Self-pay | Admitting: Family Medicine

## 2015-04-11 ENCOUNTER — Other Ambulatory Visit: Payer: Self-pay

## 2015-04-11 MED ORDER — ALPRAZOLAM 0.5 MG PO TABS: ORAL_TABLET | ORAL | Status: DC

## 2015-04-11 NOTE — Telephone Encounter (Signed)
Just seen, i thought we did this one, if not, six mo worth

## 2015-07-19 ENCOUNTER — Encounter (INDEPENDENT_AMBULATORY_CARE_PROVIDER_SITE_OTHER): Payer: Self-pay | Admitting: *Deleted

## 2015-08-25 ENCOUNTER — Other Ambulatory Visit: Payer: Self-pay | Admitting: Family Medicine

## 2015-09-13 ENCOUNTER — Other Ambulatory Visit: Payer: Self-pay | Admitting: *Deleted

## 2015-09-13 MED ORDER — CYCLOSPORINE 0.05 % OP EMUL: 1.0000 [drp] | Freq: Two times a day (BID) | OPHTHALMIC | Status: DC

## 2015-09-13 MED ORDER — LIDOCAINE-HYDROCORTISONE ACE 3-0.5 % RE KIT: PACK | RECTAL | Status: DC

## 2015-09-20 ENCOUNTER — Telehealth: Payer: Self-pay | Admitting: Family Medicine

## 2015-09-20 ENCOUNTER — Other Ambulatory Visit: Payer: Self-pay | Admitting: *Deleted

## 2015-09-20 NOTE — Telephone Encounter (Signed)
Received prior auth request for pt's RESTASIS (multiple times) Patient called to check status 09/19/15-explained that I may not be able to get approval due to it not being a medicine we normally prescribe and not having any documentation to support need of Rx Patient LMOVM for me that she contacted her eye doctor, Dr. Ronney AstersJohn Scott and he called in refills and his office will handle prior auth Had nurse remove med from her list since pt's eye doctor will be managing

## 2015-09-20 NOTE — Telephone Encounter (Signed)
This seems reasonable.

## 2015-09-25 ENCOUNTER — Telehealth: Payer: Self-pay | Admitting: Family Medicine

## 2015-09-25 NOTE — Telephone Encounter (Signed)
Nurses advise pt she will ntbs herself before we can do any type of authorization attempt for ner, or even attempt at altrnative,s dtr scott wrote these as a courtesy but we need to see pt face to face and document nongoing need for meds before attempting to pre authorize

## 2015-09-25 NOTE — Telephone Encounter (Signed)
Pt was in with her daughter recently, Dr. Nicki Reaper sent in refills for pt's metroNIDAZOLE (METROGEL) 1 % gel and Lidocaine-Hydrocortisone Ace 3-0.5 % KIT  - these are requiring prior auth with her new BCBS   I looked in Epic & paper chart and do not see documentation of trial & failure of any generic meds for either of these Rx's  (see criteria & PA form for Metrogel in green folde - also see form for Lido/HC cream kit)    (Pt was last seen here 04/05/15 & neither of these medicines were in OV note)     Please advise

## 2015-09-25 NOTE — Telephone Encounter (Signed)
error 

## 2015-09-26 NOTE — Telephone Encounter (Signed)
Pt called to say that she's having her dermatologist send me her records to try to help with the PA for the Metrogel.  I explained that Dr. Brett Canales stated that she needs to be seen here before we attempt to get it covered.  Patient said that her derm wouldn't refill & do PA due to not having seen her in 2 years Explained again that she would need to see Dr. Brett Canales or her derm to get this taken care of  Patient states she'll "figure it out"

## 2015-09-26 NOTE — Telephone Encounter (Signed)
Will absolutely need o v to jump thru those hoops if she ever calls back

## 2015-09-26 NOTE — Telephone Encounter (Signed)
[facility] 09/26/15 

## 2015-09-26 NOTE — Telephone Encounter (Signed)
Patient stated she is unable to come into office for an office visit at this time. Patient states she will contact her dermatologist who originally prescribed the metrogel and her gyn that originally prescribed the lidocaine/hydrocortisone cream and see if they are able to refill and do the authorizations.

## 2015-11-01 ENCOUNTER — Ambulatory Visit (INDEPENDENT_AMBULATORY_CARE_PROVIDER_SITE_OTHER): Payer: BLUE CROSS/BLUE SHIELD | Admitting: Internal Medicine

## 2015-11-01 ENCOUNTER — Encounter (INDEPENDENT_AMBULATORY_CARE_PROVIDER_SITE_OTHER): Payer: Self-pay | Admitting: Internal Medicine

## 2015-11-01 ENCOUNTER — Telehealth: Payer: Self-pay | Admitting: Family Medicine

## 2015-11-01 VITALS — BP 160/84 | HR 76 | Temp 98.1°F | Ht 64.0 in | Wt 137.1 lb

## 2015-11-01 DIAGNOSIS — R140 Abdominal distension (gaseous): Secondary | ICD-10-CM | POA: Diagnosis not present

## 2015-11-01 MED ORDER — OMEPRAZOLE 20 MG PO CPDR: 20.0000 mg | DELAYED_RELEASE_CAPSULE | Freq: Every day | ORAL | Status: DC

## 2015-11-01 MED ORDER — METRONIDAZOLE 250 MG PO TABS: 250.0000 mg | ORAL_TABLET | Freq: Three times a day (TID) | ORAL | Status: DC

## 2015-11-01 NOTE — Telephone Encounter (Signed)
Last seen 04/05/15

## 2015-11-01 NOTE — Progress Notes (Signed)
Subjective:    Patient ID: Victoria [name], female    DOB: 1961-04-14, 54 y.o.   MRN: [id number]  HPIHPI Here today for f/u of GERD. Hx of EGD in 2005. Has been on Prilosec 35m daily x 16 yrs.  She tells me she has tried getting off Prilosec but cannot. She will having a burning sensation. After supper, she has burping and full.  Sometimes when she burps liquid will come up. She says it is not acid reflux. She also has IBS and takes Dicyclomine. She says about every 2 weeks  she feels bloated and her abdomen is distended. She says when she bloats her BMs are uncomfortable. . She sometimes has8 BMs   and are normal looking. On average she has 5-8 stools a day.   This has been occurring for about 6 months.  She thinks she has small bowel bacterial overgrowth.  There is no abdominal pain.     04/25/2004: Esophagogastroduodenoscopy. NDICATIONS: Victoria [name] a 55year old Caucasian female who has chronic heartburn which has responded well to PPI. She also had an episode of melena recently. Therefore, EGD was advised. FINAL DIAGNOSES: Noncritical distal esophageal ring which was not manipulated as patient does not have dysphagia. The rest of the esophagogastroduodenoscopy was normal.   06/17/2009 Colonoscopy. : Reason for exam: colitis: Normal colonoscopy and terminal ileoscopy.    Review of Systems Past Medical History  Diagnosis Date  . Asthma   . GERD (gastroesophageal reflux disease)     Past Surgical History  Procedure Laterality Date  . Colonoscopy      No Known Allergies  Current Outpatient Prescriptions on File Prior to Visit  Medication Sig Dispense Refill  . ALPRAZolam (XANAX) 0.5 MG tablet TAKE 1/2 TABLET BY MOUTH DURING THE DAY AS NEEDED AND 1 TABLET BY MOUTH EVERY NIGHT AT BEDTIME AS NEEDED 45 tablet 5  . cyclobenzaprine (FLEXERIL) 10 MG tablet TAKE 1 TABLET BY MOUTH THREE TIMES DAILY AS NEEDED FOR MUSCLE SPASMS 40 tablet 0  . dicyclomine (BENTYL) 10 MG capsule  Take 1 capsule (10 mg total) by mouth as needed. Take one TID 90 capsule 3  . HYDROcodone-acetaminophen (NORCO/VICODIN) 5-325 MG per tablet Take 1 tablet by mouth at bedtime as needed. 30 tablet 0  . hydrocortisone cream 0.5 % APPLY RECTALLY TWICE DAILY AS DIRECTED FOR 7 DAYS 56 g 5  . lidocaine (LINDAMANTLE) 3 % CREA cream APPLY RECTALLY TWICE DAILY AS DIRECTED FOR 7 DAYS 85 g 5  . lidocaine-hydrocortisone (ANAMANTEL HC) 3-0.5 % CREA APPLY RECTALLY TWICE DAILY AS DIRECTED FOR 7 DAYS 85 g 5  . Lidocaine-Hydrocortisone Ace 3-0.5 % KIT APPLY RECTALLY TWICE DAILY AS DIRECTED FOR 7 DAYS 1 each 5  . Multiple Vitamin (MULTIVITAMIN) tablet Take 1 tablet by mouth daily.    . Norethindrone-Ethinyl Estradiol-Fe Biphas (LO LOESTRIN FE) 1 MG-10 MCG / 10 MCG tablet Take 1 tablet by mouth daily.    .Marland Kitchenomeprazole (PRILOSEC OTC) 20 MG tablet Take 20 mg by mouth daily.    . polyethylene glycol (MIRALAX / GLYCOLAX) packet Take 17 g by mouth daily.    . Probiotic Product (RESTORA PO) Take by mouth.    . psyllium (METAMUCIL) 58.6 % powder Take 1 packet by mouth daily.      No current facility-administered medications on file prior to visit.        Objective:   Physical Exam Blood pressure 160/84, pulse 76, temperature 98.1 F (36.7 C), height _0  (1.626 m), weight  137 lb 1.6 oz (62.188 kg). Alert and oriented. Skin warm and dry. Oral mucosa is moist.   . Sclera anicteric, conjunctivae is pink. Thyroid not enlarged. No cervical lymphadenopathy. Lungs clear. Heart regular rate and rhythm.  Abdomen is soft. Bowel sounds are positive. No hepatomegaly. No abdominal masses felt. No tenderness.  No edema to lower extremities.          Assessment & Plan:  GERD not controlled at this time. May take a Zantac at HS. Advised not to eat after 6 or 7 pm.  Bloating: Am going to try her with Flagyl 277m TID  x 7 days. She will let me know if this helps. If not may need a CT abdomen/pelvis with CM. Today her abdomen was  very soft, no distention.  OV in 1 year.

## 2015-11-01 NOTE — Telephone Encounter (Signed)
Pt is requesting refills on her HYDROcodone-acetaminophen (NORCO/VICODIN) 5-325 MG per tablet and ALPRAZolam (XANAX) 0.5 MG tablet.       [facility]

## 2015-11-01 NOTE — Telephone Encounter (Signed)
Discussed with patient. Patient advised per Dr Brett Canales: Sorry greater than six mo needs ov. Patient verbalized understanding.

## 2015-11-01 NOTE — Patient Instructions (Addendum)
Continue the Omeprazole daily. Tums as needed.  Zantac as needed OV in 1 year. She will call with a PR.

## 2015-11-01 NOTE — Telephone Encounter (Signed)
Sorry greater than six mo needs ov

## 2015-11-26 ENCOUNTER — Other Ambulatory Visit: Payer: Self-pay | Admitting: Family Medicine

## 2015-11-26 NOTE — Telephone Encounter (Signed)
Ok plus 3 monthly ref 

## 2015-12-25 ENCOUNTER — Other Ambulatory Visit: Payer: Self-pay | Admitting: Family Medicine

## 2016-01-28 DIAGNOSIS — W540XXA Bitten by dog, initial encounter: Secondary | ICD-10-CM | POA: Diagnosis not present

## 2016-01-28 DIAGNOSIS — Z6822 Body mass index (BMI) 22.0-22.9, adult: Secondary | ICD-10-CM | POA: Diagnosis not present

## 2016-01-28 DIAGNOSIS — S41151A Open bite of right upper arm, initial encounter: Secondary | ICD-10-CM | POA: Diagnosis not present

## 2016-02-05 ENCOUNTER — Encounter: Payer: Self-pay | Admitting: Family Medicine

## 2016-02-05 ENCOUNTER — Ambulatory Visit (INDEPENDENT_AMBULATORY_CARE_PROVIDER_SITE_OTHER): Payer: BLUE CROSS/BLUE SHIELD | Admitting: Family Medicine

## 2016-02-05 VITALS — BP 132/94 | Temp 99.3°F | Wt 132.0 lb

## 2016-02-05 DIAGNOSIS — S41151A Open bite of right upper arm, initial encounter: Secondary | ICD-10-CM

## 2016-02-05 DIAGNOSIS — S40021A Contusion of right upper arm, initial encounter: Secondary | ICD-10-CM

## 2016-02-05 DIAGNOSIS — W540XXA Bitten by dog, initial encounter: Secondary | ICD-10-CM | POA: Diagnosis not present

## 2016-02-05 DIAGNOSIS — J029 Acute pharyngitis, unspecified: Secondary | ICD-10-CM

## 2016-02-05 LAB — POCT RAPID STREP A (OFFICE): Rapid Strep A Screen: NEGATIVE

## 2016-02-05 MED ORDER — CYCLOBENZAPRINE HCL 10 MG PO TABS: 10.0000 mg | ORAL_TABLET | Freq: Three times a day (TID) | ORAL | Status: DC | PRN

## 2016-02-05 MED ORDER — DOXYCYCLINE HYCLATE 100 MG PO TABS: 100.0000 mg | ORAL_TABLET | Freq: Two times a day (BID) | ORAL | Status: DC

## 2016-02-05 MED ORDER — ALPRAZOLAM 0.5 MG PO TABS: ORAL_TABLET | ORAL | Status: DC

## 2016-02-05 MED ORDER — FIRST-DUKES MOUTHWASH MT SUSP: OROMUCOSAL | Status: DC

## 2016-02-05 MED ORDER — HYDROCODONE-ACETAMINOPHEN 5-325 MG PO TABS: ORAL_TABLET | ORAL | Status: DC

## 2016-02-05 MED ORDER — MUPIROCIN 2 % EX OINT: TOPICAL_OINTMENT | CUTANEOUS | Status: DC

## 2016-02-05 NOTE — Progress Notes (Signed)
   Subjective:    Patient ID: Victoria [name], female    DOB: 1961-03-31, 55 y.o.   MRN: [id number]  HPIDog bite on right arm. Happened may 22. Went to urgent care. Was out of town. Arm is very itching, warm, numb, squeezing pain, right shoulder pain, having pain on inside of right elbow. Arm is weak and shaky. Pt feels like circulation is not good. Having tinging in arm. Bruise on right hip. Taking augmenting 875mg  bid, aleve bid, hyrdrocodone, bactroban and flexeril.   tdap given at urgent care.   Sore throat. Started 2 days ago. White patches in throat yesterday. Fatigue and weak. Headache.   Rapid strep test negative.   Sinus pressure and left ear stopped up yesterday. Dizziness.   Diarrhea yesterday twice. None today.   Pt wants refill on xanax, flexeril and hydrocodone.    Right upper arm discomfort  vdry sensitive and tender  Upper arm and shoulder discomfort     Review of Systems No headache, no major weight loss or weight gain, no chest pain no back pain abdominal pain no change in bowel habits complete ROS otherwise negative     Objective:   Physical Exam Alert anxious appearing mild distress. HEENT pharynx pustules and patches whitish noted in throat. No adenopathy. Neck supple. Lungs clear heart regular in rhythm right upper arm impressive hematoma central region reveals puncture wound no active drainage moderate induration distal hand strength sensation all currently intact pulses fine       Assessment & Plan:  Impression dog bite with impressive hematoma puncture wound non-stitched, patient treated appropriately with Augmentin still substantial induration and tenderness. We'll go ahead and switch antibiotics rationale discussed with patient. Do not feel any major neurovascular damage done at this point. Patient had many questions in this regard. #2 thrush probably secondary to the antibiotics No. 3 anxiety history of this with substantial flare currently with  symptomatology plan 25 minutes spent most in discussion switch to doxycycline. Xanax refilled. Flexeril when necessary. Dukes Magic mouthwash for probable thrush element. Expect gradual and steady improvement, will be happy to reassess next week if this does not occur

## 2016-02-06 LAB — STREP A DNA PROBE: STREP GP A DIRECT, DNA PROBE: NEGATIVE

## 2016-02-07 DIAGNOSIS — S41151A Open bite of right upper arm, initial encounter: Secondary | ICD-10-CM | POA: Diagnosis not present

## 2016-02-07 DIAGNOSIS — M79601 Pain in right arm: Secondary | ICD-10-CM | POA: Diagnosis not present

## 2016-02-13 ENCOUNTER — Telehealth: Payer: Self-pay | Admitting: Family Medicine

## 2016-02-13 MED ORDER — NYSTATIN 100000 UNIT/ML MT SUSP: 10.0000 mL | Freq: Four times a day (QID) | OROMUCOSAL | Status: DC

## 2016-02-13 NOTE — Telephone Encounter (Signed)
Stop abx, nystantin susp gargle and spit two tspns qid eight oz

## 2016-02-13 NOTE — Telephone Encounter (Signed)
Prescription sent electronically to pharmacy. Patient notified. 

## 2016-02-13 NOTE — Telephone Encounter (Signed)
Pt called stating that she has extreme nausea and believes that the antibiotic and or the mouth wash is causing it. Pt has stopped both and states that the thrush is starting to come back. Pt wants to know what else can be done especially for the nausea.

## 2016-02-22 DIAGNOSIS — S41151D Open bite of right upper arm, subsequent encounter: Secondary | ICD-10-CM | POA: Diagnosis not present

## 2016-02-22 DIAGNOSIS — M79601 Pain in right arm: Secondary | ICD-10-CM | POA: Diagnosis not present

## 2016-02-25 ENCOUNTER — Other Ambulatory Visit: Payer: Self-pay | Admitting: Sports Medicine

## 2016-02-25 DIAGNOSIS — M79601 Pain in right arm: Secondary | ICD-10-CM

## 2016-02-27 DIAGNOSIS — R609 Edema, unspecified: Secondary | ICD-10-CM | POA: Diagnosis not present

## 2016-02-27 DIAGNOSIS — M779 Enthesopathy, unspecified: Secondary | ICD-10-CM | POA: Diagnosis not present

## 2016-02-27 DIAGNOSIS — W540XXA Bitten by dog, initial encounter: Secondary | ICD-10-CM | POA: Diagnosis not present

## 2016-02-27 DIAGNOSIS — S51051A Open bite, right elbow, initial encounter: Secondary | ICD-10-CM | POA: Diagnosis not present

## 2016-03-08 ENCOUNTER — Other Ambulatory Visit: Payer: BLUE CROSS/BLUE SHIELD

## 2016-03-12 ENCOUNTER — Other Ambulatory Visit: Payer: Self-pay | Admitting: Family Medicine

## 2016-04-01 DIAGNOSIS — S4981XD Other specified injuries of right shoulder and upper arm, subsequent encounter: Secondary | ICD-10-CM | POA: Diagnosis not present

## 2016-04-01 DIAGNOSIS — M25521 Pain in right elbow: Secondary | ICD-10-CM | POA: Diagnosis not present

## 2016-04-23 DIAGNOSIS — S4991XD Unspecified injury of right shoulder and upper arm, subsequent encounter: Secondary | ICD-10-CM | POA: Diagnosis not present

## 2016-04-23 DIAGNOSIS — M25521 Pain in right elbow: Secondary | ICD-10-CM | POA: Diagnosis not present

## 2016-04-23 DIAGNOSIS — S41151D Open bite of right upper arm, subsequent encounter: Secondary | ICD-10-CM | POA: Diagnosis not present

## 2016-04-29 DIAGNOSIS — Z01411 Encounter for gynecological examination (general) (routine) with abnormal findings: Secondary | ICD-10-CM | POA: Diagnosis not present

## 2016-04-29 DIAGNOSIS — Z01419 Encounter for gynecological examination (general) (routine) without abnormal findings: Secondary | ICD-10-CM | POA: Diagnosis not present

## 2016-04-29 DIAGNOSIS — M8588 Other specified disorders of bone density and structure, other site: Secondary | ICD-10-CM | POA: Diagnosis not present

## 2016-04-29 DIAGNOSIS — Z6823 Body mass index (BMI) 23.0-23.9, adult: Secondary | ICD-10-CM | POA: Diagnosis not present

## 2016-04-29 DIAGNOSIS — Z13820 Encounter for screening for osteoporosis: Secondary | ICD-10-CM | POA: Diagnosis not present

## 2016-04-29 DIAGNOSIS — N958 Other specified menopausal and perimenopausal disorders: Secondary | ICD-10-CM | POA: Diagnosis not present

## 2016-04-29 DIAGNOSIS — Z1231 Encounter for screening mammogram for malignant neoplasm of breast: Secondary | ICD-10-CM | POA: Diagnosis not present

## 2016-05-01 DIAGNOSIS — M25521 Pain in right elbow: Secondary | ICD-10-CM | POA: Diagnosis not present

## 2016-05-05 DIAGNOSIS — M25521 Pain in right elbow: Secondary | ICD-10-CM | POA: Diagnosis not present

## 2016-05-08 DIAGNOSIS — M25521 Pain in right elbow: Secondary | ICD-10-CM | POA: Diagnosis not present

## 2016-05-13 DIAGNOSIS — M25521 Pain in right elbow: Secondary | ICD-10-CM | POA: Diagnosis not present

## 2016-05-15 DIAGNOSIS — M25521 Pain in right elbow: Secondary | ICD-10-CM | POA: Diagnosis not present

## 2016-05-19 DIAGNOSIS — M25521 Pain in right elbow: Secondary | ICD-10-CM | POA: Diagnosis not present

## 2016-05-22 DIAGNOSIS — M25521 Pain in right elbow: Secondary | ICD-10-CM | POA: Diagnosis not present

## 2016-05-26 DIAGNOSIS — M25521 Pain in right elbow: Secondary | ICD-10-CM | POA: Diagnosis not present

## 2016-06-03 ENCOUNTER — Other Ambulatory Visit (INDEPENDENT_AMBULATORY_CARE_PROVIDER_SITE_OTHER): Payer: Self-pay | Admitting: Internal Medicine

## 2016-06-05 DIAGNOSIS — M25521 Pain in right elbow: Secondary | ICD-10-CM | POA: Diagnosis not present

## 2016-06-09 DIAGNOSIS — M25521 Pain in right elbow: Secondary | ICD-10-CM | POA: Diagnosis not present

## 2016-06-12 DIAGNOSIS — M25521 Pain in right elbow: Secondary | ICD-10-CM | POA: Diagnosis not present

## 2016-06-17 DIAGNOSIS — R202 Paresthesia of skin: Secondary | ICD-10-CM | POA: Diagnosis not present

## 2016-06-17 DIAGNOSIS — M25521 Pain in right elbow: Secondary | ICD-10-CM | POA: Diagnosis not present

## 2016-06-17 DIAGNOSIS — S41151D Open bite of right upper arm, subsequent encounter: Secondary | ICD-10-CM | POA: Diagnosis not present

## 2016-07-07 DIAGNOSIS — Z23 Encounter for immunization: Secondary | ICD-10-CM | POA: Diagnosis not present

## 2016-07-09 ENCOUNTER — Telehealth (INDEPENDENT_AMBULATORY_CARE_PROVIDER_SITE_OTHER): Payer: Self-pay | Admitting: *Deleted

## 2016-07-09 ENCOUNTER — Other Ambulatory Visit (INDEPENDENT_AMBULATORY_CARE_PROVIDER_SITE_OTHER): Payer: Self-pay | Admitting: *Deleted

## 2016-07-09 DIAGNOSIS — K6289 Other specified diseases of anus and rectum: Secondary | ICD-10-CM | POA: Diagnosis not present

## 2016-07-09 DIAGNOSIS — R140 Abdominal distension (gaseous): Secondary | ICD-10-CM

## 2016-07-09 DIAGNOSIS — R110 Nausea: Secondary | ICD-10-CM

## 2016-07-09 DIAGNOSIS — R195 Other fecal abnormalities: Secondary | ICD-10-CM

## 2016-07-09 DIAGNOSIS — R208 Other disturbances of skin sensation: Secondary | ICD-10-CM

## 2016-07-09 LAB — CBC
HEMATOCRIT: 41.4 % (ref 35.0–45.0)
HEMOGLOBIN: 14.2 g/dL (ref 11.7–15.5)
MCH: 34.5 pg — AB (ref 27.0–33.0)
MCHC: 34.3 g/dL (ref 32.0–36.0)
MCV: 100.5 fL — ABNORMAL HIGH (ref 80.0–100.0)
MPV: 9 fL (ref 7.5–12.5)
Platelets: 242 10*3/uL (ref 140–400)
RBC: 4.12 MIL/uL (ref 3.80–5.10)
RDW: 12.3 % (ref 11.0–15.0)
WBC: 9.7 10*3/uL (ref 3.8–10.8)

## 2016-07-09 NOTE — Telephone Encounter (Signed)
Patient calls in , for 1 1/2 week she has had IBS. Feels nauseous. She has felt worse the last couple of days. Currently , eating bland food (diet). Stomach is burning , rectum is burning. She is taking the Dicyclomine and it is not helping now. Stools are loose (looks like it has been in a blender). Today , she has seen red blood , liquid in some of the BM's . She has gone 5-7 times today but says that its not abnormal for her to go multiple times a day. She is experiencing, chills , nausea, bloated and denies fever.  Patient advised that Dr.Rehman would be contacted , then we would get back in touch with her with his recommendation.  She ask that it be today.  Dr.Rehman was paged.  Per Dr.Rehman - patient may take the Imodium OTC , GI Pathogen , CBC. After the stool is collected she may start Cipro 500 mg po BID for 7 days. Flagyl 250 mg po tid for 7 days. Patient may have an appointment tomorrow with Dorene Arerri Setzer Np per Dr. Karilyn Cotaehman.

## 2016-07-14 LAB — GASTROINTESTINAL PATHOGEN PANEL PCR
C. difficile Tox A/B, PCR: NOT DETECTED
CRYPTOSPORIDIUM, PCR: NOT DETECTED
Campylobacter, PCR: NOT DETECTED
E COLI (ETEC) LT/ST, PCR: NOT DETECTED
E COLI (STEC) STX1/STX2, PCR: NOT DETECTED
E COLI 0157, PCR: NOT DETECTED
Giardia lamblia, PCR: NOT DETECTED
NOROVIRUS, PCR: NOT DETECTED
Rotavirus A, PCR: NOT DETECTED
Salmonella, PCR: NOT DETECTED
Shigella, PCR: NOT DETECTED

## 2016-07-15 ENCOUNTER — Encounter: Payer: Self-pay | Admitting: Family Medicine

## 2016-07-15 ENCOUNTER — Ambulatory Visit (HOSPITAL_COMMUNITY)
Admission: RE | Admit: 2016-07-15 | Discharge: 2016-07-15 | Disposition: A | Discharge status: Home / Self Care | Payer: BLUE CROSS/BLUE SHIELD | Source: Ambulatory Visit | Attending: Family Medicine | Admitting: Family Medicine

## 2016-07-15 ENCOUNTER — Ambulatory Visit (INDEPENDENT_AMBULATORY_CARE_PROVIDER_SITE_OTHER): Payer: BLUE CROSS/BLUE SHIELD | Admitting: Family Medicine

## 2016-07-15 VITALS — BP 136/90 | Ht 64.0 in | Wt 134.4 lb

## 2016-07-15 DIAGNOSIS — S41151A Open bite of right upper arm, initial encounter: Secondary | ICD-10-CM

## 2016-07-15 DIAGNOSIS — H8110 Benign paroxysmal vertigo, unspecified ear: Secondary | ICD-10-CM

## 2016-07-15 DIAGNOSIS — M545 Low back pain: Secondary | ICD-10-CM

## 2016-07-15 DIAGNOSIS — F411 Generalized anxiety disorder: Secondary | ICD-10-CM

## 2016-07-15 DIAGNOSIS — M5440 Lumbago with sciatica, unspecified side: Secondary | ICD-10-CM | POA: Diagnosis not present

## 2016-07-15 DIAGNOSIS — M5136 Other intervertebral disc degeneration, lumbar region: Secondary | ICD-10-CM | POA: Diagnosis not present

## 2016-07-15 DIAGNOSIS — W540XXA Bitten by dog, initial encounter: Secondary | ICD-10-CM

## 2016-07-15 DIAGNOSIS — R000 Tachycardia, unspecified: Secondary | ICD-10-CM

## 2016-07-15 NOTE — Progress Notes (Signed)
Subjective:    Patient ID: Victoria [name], female    DOB: 14-Dec-1960, 55 y.o.   MRN: [id number] Patient arrives office was 7 pages of handwritten notes in for an extremely extensive discussion of all of her worries and concerns Back Pain  This is a new problem. The pain is present in the lumbar spine. Radiates to: right hip    pt hx of ibs, recent blood in stools, had sample given and treated with flagyl, going on for weeks    Patient has c/o dizzy spells,mostly in the car, makes pt pull over on the road , feels so bad like is going to pass out, pt decelops transient sudden headache, at one tine felt like things were closing in and was going to pas ou Also can get this as a passenger in a car  Pt will expeince sudden intense spells heart feels funny and anxios at feeling bad with it . But hx otachycardia Off and on for recent months worse since may 22nd  pe  Pt dizzy with vertigo intermittently , tries small amnt of xanax does not help much  Zipping of cars going by bothers the patient, feels weird, wondered if its a visual issue, no sig nausea or queaziness  ' Eyes seem to be jumping at times when these spells occur   dog bite that occurred in May, IBS (currently seeing Dr.Rehman).   Also c/o ulcer in throat., developed one about a month ago, this one flaring up in the past few days, very painful, and dim enrgy with  Also persistent arm tissue symtoms post the dog bit, saw many PT visits, had MRI, cont to experience ongooing pain and symptoms Persistent pain in the are that's left still at this time Dr Butler DenmarkGrammig brought up wanting to do nerve conduction studies, but pt worred about it  Lower right back pain and uncomfortable  Extreme pain going on for a month Tries otc med woise ibu or aleave take one twice per day  Bad pain I the right flank for quite some time  BP up and sown, pt monitoring it closely, hreart rate often 115 over 75    Review of Systems  Musculoskeletal:  Positive for back pain.  No headache, no major weight loss or weight gain, no chest pain no back pain abdominal pain no change in bowel habits complete ROS otherwise negative      Objective:   Physical Exam Alert anxious appearing blood pressure decent on repeat heart rate slightly elevated approximately 95 bpm initially. HEENT TMs normal fundi discharge neck supple lungs clear. Heart regular rate and rhythm. Cerebellar function normal no focal neurological deficits. No CVA tenderness. Positive right lower lumbar tenderness to deep palpation. Good range of motion of hip. Right arm old scarring from dog bite noted. Localized tenderness. Right hand grip intact strength intact pulses good paresthesias noted distally and hand  EKG performed within normal limits     Assessment & Plan:  Impression 1 intermittent worsening vertigo. Diagnosed with vertigo in the past Can occur when driving and often triggered by trees going by the peripheral vision while driving. Also occurring at home. Patient claims has substantially worsen since her dog bite this spring #2 dog bite with residual pain localized. Also vague neuropathic features. Specialist has offered neurology workup and nerve conduction studies thus far patient has held off #4 probable element of panic attacks with vertigo potentially associated #5 generalized anxiety disorder discussed with the patient. #6 right low  back hip pain. Likely this is emanating from the low back. Doubt true sciatica element. Doubt major right hip element. #7 tachycardia with patient worried about it doubt serious underlying cardiac discussed Plan blood work from patient's GYN. Note from patient's orthopedist. Low back x-rays. Return in a week for further discussion of this incredibly complicated patient. A full 40 minutes spent primarily in discussion of patient's symptomatology and concerns

## 2016-07-16 ENCOUNTER — Other Ambulatory Visit (INDEPENDENT_AMBULATORY_CARE_PROVIDER_SITE_OTHER): Payer: Self-pay | Admitting: *Deleted

## 2016-07-16 DIAGNOSIS — K222 Esophageal obstruction: Secondary | ICD-10-CM

## 2016-07-16 DIAGNOSIS — Z860100 Personal history of colon polyps, unspecified: Secondary | ICD-10-CM

## 2016-07-16 DIAGNOSIS — K219 Gastro-esophageal reflux disease without esophagitis: Secondary | ICD-10-CM

## 2016-07-17 ENCOUNTER — Encounter: Payer: Self-pay | Admitting: Family Medicine

## 2016-07-17 ENCOUNTER — Telehealth: Payer: Self-pay | Admitting: Family Medicine

## 2016-07-17 NOTE — Telephone Encounter (Signed)
Left message on voicemail to return call.

## 2016-07-17 NOTE — Telephone Encounter (Signed)
Patient states she was told to have lab done before next visit on 11/15.She thinking they had been put in from last visit.

## 2016-07-17 NOTE — Telephone Encounter (Signed)
No, actually pt told me she had " a lot" of labs from her gyn within past few mo, I told her I had to get copy of these before knowing what else to order, if anything Pt was told to sign for labs, along with report from her ortho, have we got them yet?? Let pt know we are waiting for these before ordering

## 2016-07-18 NOTE — Telephone Encounter (Signed)
Notified patient no, actually patient told Dr. Brett CanalesSteve she had " a lot" of labs from her gyn within past few months, Dr. Brett CanalesSteve told her he had to get copy of these before knowing what else to order. Patient did sign for labs, along with report from her ortho. Awaiting reports. Advised patient we will contact her after Dr. Brett CanalesSteve reviews reports if anything else needs to be ordered. Patient verbalized understanding.

## 2016-07-22 ENCOUNTER — Telehealth: Payer: Self-pay

## 2016-07-22 DIAGNOSIS — M5416 Radiculopathy, lumbar region: Secondary | ICD-10-CM | POA: Diagnosis not present

## 2016-07-22 DIAGNOSIS — M545 Low back pain: Secondary | ICD-10-CM | POA: Diagnosis not present

## 2016-07-22 NOTE — Telephone Encounter (Signed)
Pt has pulse ox and notes dizzy spells for over a month and saw pcp without resolution. Did not take BP. Today HR was 112 and states her pulse ox was 91.States she feels her heart "pops" requests to be seen,apt made for this week with NP on Thursday at 3:30 pm

## 2016-07-23 ENCOUNTER — Ambulatory Visit (INDEPENDENT_AMBULATORY_CARE_PROVIDER_SITE_OTHER): Payer: BLUE CROSS/BLUE SHIELD | Admitting: Family Medicine

## 2016-07-23 ENCOUNTER — Encounter: Payer: Self-pay | Admitting: Family Medicine

## 2016-07-23 VITALS — BP 124/86 | Ht 64.0 in | Wt 135.1 lb

## 2016-07-23 DIAGNOSIS — H8110 Benign paroxysmal vertigo, unspecified ear: Secondary | ICD-10-CM

## 2016-07-23 DIAGNOSIS — R42 Dizziness and giddiness: Secondary | ICD-10-CM

## 2016-07-23 DIAGNOSIS — F411 Generalized anxiety disorder: Secondary | ICD-10-CM

## 2016-07-23 DIAGNOSIS — R000 Tachycardia, unspecified: Secondary | ICD-10-CM | POA: Diagnosis not present

## 2016-07-23 DIAGNOSIS — M545 Low back pain: Secondary | ICD-10-CM

## 2016-07-23 MED ORDER — DULOXETINE HCL 30 MG PO CPEP: 30.0000 mg | ORAL_CAPSULE | Freq: Every day | ORAL | 2 refills | Status: DC

## 2016-07-23 MED ORDER — ALPRAZOLAM 0.5 MG PO TABS: ORAL_TABLET | ORAL | 2 refills | Status: DC

## 2016-07-23 NOTE — Progress Notes (Signed)
   Subjective:    Patient ID: Victoria [name], female    DOB: 11-20-1960, 55 y.o.   MRN: [id number]  HPI Patient is here to follow up on dizziness,   heart issues (EKG done last week),  Pt saw doc yest for back, saw dr beane in the back,  Toe pain and weakness, in the right leg, due to have MRI on saturday  May be facing p t soon   panic attacks and low back pain.   Patient not sure if she needs a MRI of her brain done or not,  . Patient has no other concerns at this time.   [name], needs record sent,        Review of Systems No headache, no major weight loss or weight gain, no chest pain no back pain abdominal pain no change in bowel habits complete ROS otherwise negative     Objective:   Physical Exam  Alert vitals stable, No major distress Blood pressure good on repeat. HEENT normal. Lungs clear. Heart regular rate and rhythm. Very anxious appearing      Assessment & Plan:  Impression 1 tachycardia highly doubt serious cardiac etiology discussed #2 neuropathic pain right arm along with localized soft tissue pain post bite injury #3 intermittent mysterious vertigo spells number for headaches. #5 chronic back pain of note patient went to specialist yesterday and has been plugged into major workup for this #6 anxiety generalized anxiety with element of panic disorder discussed plan initiate Cymbalta 30 mg every morning. Increase Xanax per patient request. Exercise diet discussed. Recommend neurology visit she is artery set up an appointment with a neurologist. Recheck in a couple months [facility]

## 2016-07-24 ENCOUNTER — Ambulatory Visit (INDEPENDENT_AMBULATORY_CARE_PROVIDER_SITE_OTHER): Payer: BLUE CROSS/BLUE SHIELD

## 2016-07-24 ENCOUNTER — Encounter: Payer: Self-pay | Admitting: Adult Health

## 2016-07-24 ENCOUNTER — Ambulatory Visit (INDEPENDENT_AMBULATORY_CARE_PROVIDER_SITE_OTHER): Payer: BLUE CROSS/BLUE SHIELD | Admitting: Adult Health

## 2016-07-24 VITALS — BP 158/92 | HR 87 | Ht 64.0 in | Wt 135.0 lb

## 2016-07-24 DIAGNOSIS — I10 Essential (primary) hypertension: Secondary | ICD-10-CM | POA: Diagnosis not present

## 2016-07-24 DIAGNOSIS — R42 Dizziness and giddiness: Secondary | ICD-10-CM

## 2016-07-24 DIAGNOSIS — R002 Palpitations: Secondary | ICD-10-CM

## 2016-07-24 DIAGNOSIS — D508 Other iron deficiency anemias: Secondary | ICD-10-CM

## 2016-07-24 DIAGNOSIS — I471 Supraventricular tachycardia: Secondary | ICD-10-CM

## 2016-07-24 MED ORDER — METOPROLOL TARTRATE 25 MG PO TABS: 12.5000 mg | ORAL_TABLET | Freq: Two times a day (BID) | ORAL | 3 refills | Status: DC

## 2016-07-24 NOTE — Patient Instructions (Addendum)
Your physician recommends that you schedule a follow-up appointment in: 3-4 Weeks  Your physician has recommended you make the following change in your medication:  Start Lopressor 12.5 mg Two Times Daily   Your physician recommends that you return for lab work in: Fasting  Your physician has recommended that you wear an event monitor. Event monitors are medical devices that record the heart's electrical activity. Doctors most often us these monitors to diagnose arrhythmias. Arrhythmias are problems with the speed or rhythm of the heartbeat. The monitor is a small, portable device. You can wear one while you do your normal daily activities. This is usually used to diagnose what is causing palpitations/syncope (passing out).  If you need a refill on your cardiac medications before your next appointment, please call your pharmacy.  Thank you for choosing [facility]!

## 2016-07-24 NOTE — Progress Notes (Signed)
Name: Victoria [name]    DOB: [birth year]  Age: 55 y.o.  MR#: [id number]       PCP:  Mickie Hillier, MD      Insurance: Payor: [employer] / Plan: [place] / Product Type: *No Product type* /   CC:   No chief complaint on file.   VS Vitals:   07/24/16 1526  BP: (!) 158/92  Pulse: 87  SpO2: 98%  Weight: 135 lb (61.2 kg)  Height: 5' 4"  (1.626 m)    Weights Current Weight  07/24/16 135 lb (61.2 kg)  07/23/16 135 lb 2 oz (61.3 kg)  07/15/16 134 lb 6 oz (61 kg)    Blood Pressure  BP Readings from Last 3 Encounters:  07/24/16 (!) 158/92  07/23/16 124/86  07/15/16 136/90     Admit date:  (Not on file) Last encounter with RMR:  Visit date not found   Allergy Patient has no known allergies.  Current Outpatient Prescriptions  Medication Sig Dispense Refill  . ALPRAZolam (XANAX) 0.5 MG tablet Take 1/2 - 1 tablet po BID PRN 60 tablet 2  . ANALPRAM-HC 1-1 % rectal cream APPLY TO THE AFFECTED BID  12  . Calcium-Magnesium-Vitamin D (CALCIUM 500 PO) Take by mouth.    . cyclobenzaprine (FLEXERIL) 10 MG tablet Take 1 tablet (10 mg total) by mouth 3 (three) times daily as needed. for muscle spams 40 tablet 3  . dicyclomine (BENTYL) 10 MG capsule TAKE 1 CAPSULE(10 MG) BY MOUTH THREE TIMES DAILY 90 capsule 3  . DULoxetine (CYMBALTA) 30 MG capsule Take 1 capsule (30 mg total) by mouth daily. 30 capsule 2  . HYDROcodone-acetaminophen (NORCO/VICODIN) 5-325 MG tablet Take one every 4 - 6 hours prn pain 36 tablet 0  . hydrocortisone cream 0.5 % APPLY RECTALLY TWICE DAILY AS DIRECTED FOR 7 DAYS 56 g 5  . lidocaine (LINDAMANTLE) 3 % CREA cream APPLY RECTALLY TWICE DAILY AS DIRECTED FOR 7 DAYS 85 g 5  . lidocaine-hydrocortisone (ANAMANTEL HC) 3-0.5 % CREA APPLY RECTALLY TWICE DAILY AS DIRECTED FOR 7 DAYS 85 g 5  . Lidocaine-Hydrocortisone Ace 3-0.5 % KIT APPLY RECTALLY TWICE DAILY AS DIRECTED FOR 7 DAYS 1 each 5  . LO LOESTRIN FE 1 MG-10 MCG / 10 MCG tablet TAKE 1 TABLET BY MOUTH EVERY DAY  28 tablet 0  . Magnesium Gluconate (MAGNESIUM 27 PO) Take by mouth.    . metroNIDAZOLE (FLAGYL) 250 MG tablet TK 1 T PO TID FOR 7 DAYS  0  . metroNIDAZOLE (METROGEL) 1 % gel APPLY AA ON FACE QHS  8  . Multiple Vitamin (MULTIVITAMIN) tablet Take 1 tablet by mouth daily.    . mupirocin ointment (BACTROBAN) 2 % APP TOPICALLY AA TID FOR 7-10 DAYS 22 g 3  . omeprazole (PRILOSEC) 20 MG capsule Take 1 capsule (20 mg total) by mouth daily. 90 capsule 3  . polyethylene glycol (MIRALAX / GLYCOLAX) packet Take 17 g by mouth daily.    . Probiotic Product (RESTORA PO) Take by mouth.    . psyllium (METAMUCIL) 58.6 % powder Take 1 packet by mouth daily.     . RESTASIS 0.05 % ophthalmic emulsion INSTILL 1 DROP IN EACH EYE Q 12 H  3   No current facility-administered medications for this visit.     Discontinued Meds:    Medications Discontinued During This Encounter  Medication Reason  . nystatin (MYCOSTATIN) 100000 UNIT/ML suspension Completed Course    Patient Active Problem List  Diagnosis Date Noted  . Palpitations 07/21/2014  . Tachycardia 07/21/2014  . Irritable bowel syndrome 09/11/2013  . Insomnia 09/11/2013  . Generalized anxiety disorder 09/11/2013  . Esophageal reflux 09/11/2013  . Allergic rhinitis 09/11/2013    LABS    Component Value Date/Time   NA 140 03/20/2014 1109   K 4.3 03/20/2014 1109   CL 105 03/20/2014 1109   CO2 29 03/20/2014 1109   GLUCOSE 93 03/20/2014 1109   BUN 14 03/20/2014 1109   CREATININE 0.73 03/20/2014 1109   CALCIUM 9.1 03/20/2014 1109   CMP     Component Value Date/Time   NA 140 03/20/2014 1109   K 4.3 03/20/2014 1109   CL 105 03/20/2014 1109   CO2 29 03/20/2014 1109   GLUCOSE 93 03/20/2014 1109   BUN 14 03/20/2014 1109   CREATININE 0.73 03/20/2014 1109   CALCIUM 9.1 03/20/2014 1109       Component Value Date/Time   WBC 9.7 07/09/2016 1645   WBC 5.0 03/20/2014 1109   HGB 14.2 07/09/2016 1645   HGB 14.6 03/20/2014 1109   HCT 41.4  07/09/2016 1645   HCT 42.7 03/20/2014 1109   MCV 100.5 (H) 07/09/2016 1645   MCV 97.3 03/20/2014 1109    Lipid Panel     Component Value Date/Time   CHOL 212 (H) 03/20/2014 1109   TRIG 96 03/20/2014 1109   HDL 67 03/20/2014 1109   CHOLHDL 3.2 03/20/2014 1109   VLDL 19 03/20/2014 1109   LDLCALC 126 (H) 03/20/2014 1109    ABG No results found for: PHART, PCO2ART, PO2ART, HCO3, TCO2, ACIDBASEDEF, O2SAT   Lab Results  Component Value Date   TSH 2.534 03/20/2014   BNP (last 3 results) No results for input(s): BNP in the last 8760 hours.  ProBNP (last 3 results) No results for input(s): PROBNP in the last 8760 hours.  Cardiac Panel (last 3 results) No results for input(s): CKTOTAL, CKMB, TROPONINI, RELINDX in the last 72 hours.  Iron/TIBC/Ferritin/ %Sat No results found for: IRON, TIBC, FERRITIN, IRONPCTSAT   EKG Orders placed or performed in visit on 07/17/16  . EKG     Prior Assessment and Plan Problem List as of 07/24/2016 Reviewed: 02/05/2016  5:02 PM by Mickie Hillier, MD     Respiratory   Allergic rhinitis     Digestive   Irritable bowel syndrome   Esophageal reflux     Other   Palpitations   Tachycardia   Insomnia   Generalized anxiety disorder       Imaging: Dg Lumbar Spine Complete  Result Date: 07/16/2016 CLINICAL DATA:  Low back pain radiating up into the right side for the past month with no known injuries. EXAM: LUMBAR SPINE - COMPLETE 4+ VIEW COMPARISON:  Coronal and sagittal images through the lumbar spine from an abdominal and pelvic CT scan dated November 22, 2009. FINDINGS: The vertebral bodies are preserved in height. The disc space heights are well maintained with exception of L3-4 where there is mild narrowing. There is no spondylolisthesis. There is no significant facet joint hypertrophy. The pedicles and transverse processes are intact. The observed portions of the sacrum are normal. The bowel gas pattern is normal where visualized. No urinary  tract stones are observed. Calcification projecting over the right kidney on the frontal view does not follow the kidney on the oblique views. 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 No compression fracture nor other acute bony abnormality. Electronically Signed   By: David  Martinique M.D.  On: 07/16/2016 07:59

## 2016-07-24 NOTE — Progress Notes (Signed)
Cardiology Office Note   Date:  07/24/2016   ID:  [name], [name] 26-Apr-1961, MRN [id number]  PCP:  Mickie Hillier, MD  Cardiologist: Woodroe Chen, NP   Chief Complaint  Patient presents with  . Tachycardia  . Dizziness      History of Present Illness: Victoria [name] is a 55 y.o. female normally seen in the [place] office, who presents for ongoing assessment and management of tachycardia, palpitations, prior history of PSVT, normal systolic function with grade 1 diastolic dysfunction, other history to include GERD.[place] Kitchen Hypertension. She was seen by Dr. Wolfgang Phoenix, with complaints of tachycardia, neuropathic pain in the right arm along with localized soft tissue pain, intermitted "mysterious" vertigo spells and headaches. She also has a history of anxiety.  The patient's Xanax was increased, and Cymbalta 30 mg every morning was initiated.  She is here today with a three-page list of symptoms and complaints. She apparently has been having frequent episodes of heart racing, associated dizziness, chest pressure, flushing, weakness. She has an O2 sat monitor which she put on her finger to evaluate her heart rate, and sounded on her found to show me what her heart rate is doing. It began at 116 bpm and one up to 127 bpm. She states during that time she felt very jittery along with some mild shortness of breath and weakness.  She denies associated chest pain, syncope, but does have associated fatigue after the episodes along with feeling cold.  Past Medical History:  Diagnosis Date  . Asthma   . GERD (gastroesophageal reflux disease)     Past Surgical History:  Procedure Laterality Date  . COLONOSCOPY       Current Outpatient Prescriptions  Medication Sig Dispense Refill  . ALPRAZolam (XANAX) 0.5 MG tablet Take 1/2 - 1 tablet po BID PRN 60 tablet 2  . ANALPRAM-HC 1-1 % rectal cream APPLY TO THE AFFECTED BID  12  . Calcium-Magnesium-Vitamin D (CALCIUM 500 PO) Take by mouth.    .  cyclobenzaprine (FLEXERIL) 10 MG tablet Take 1 tablet (10 mg total) by mouth 3 (three) times daily as needed. for muscle spams 40 tablet 3  . dicyclomine (BENTYL) 10 MG capsule TAKE 1 CAPSULE(10 MG) BY MOUTH THREE TIMES DAILY 90 capsule 3  . DULoxetine (CYMBALTA) 30 MG capsule Take 1 capsule (30 mg total) by mouth daily. 30 capsule 2  . HYDROcodone-acetaminophen (NORCO/VICODIN) 5-325 MG tablet Take one every 4 - 6 hours prn pain 36 tablet 0  . hydrocortisone cream 0.5 % APPLY RECTALLY TWICE DAILY AS DIRECTED FOR 7 DAYS 56 g 5  . lidocaine (LINDAMANTLE) 3 % CREA cream APPLY RECTALLY TWICE DAILY AS DIRECTED FOR 7 DAYS 85 g 5  . lidocaine-hydrocortisone (ANAMANTEL HC) 3-0.5 % CREA APPLY RECTALLY TWICE DAILY AS DIRECTED FOR 7 DAYS 85 g 5  . Lidocaine-Hydrocortisone Ace 3-0.5 % KIT APPLY RECTALLY TWICE DAILY AS DIRECTED FOR 7 DAYS 1 each 5  . LO LOESTRIN FE 1 MG-10 MCG / 10 MCG tablet TAKE 1 TABLET BY MOUTH EVERY DAY 28 tablet 0  . Magnesium Gluconate (MAGNESIUM 27 PO) Take by mouth.    . metroNIDAZOLE (FLAGYL) 250 MG tablet TK 1 T PO TID FOR 7 DAYS  0  . metroNIDAZOLE (METROGEL) 1 % gel APPLY AA ON FACE QHS  8  . Multiple Vitamin (MULTIVITAMIN) tablet Take 1 tablet by mouth daily.    . mupirocin ointment (BACTROBAN) 2 % APP TOPICALLY AA TID FOR 7-10 DAYS 22 g  3  . omeprazole (PRILOSEC) 20 MG capsule Take 1 capsule (20 mg total) by mouth daily. 90 capsule 3  . polyethylene glycol (MIRALAX / GLYCOLAX) packet Take 17 g by mouth daily.    . Probiotic Product (RESTORA PO) Take by mouth.    . psyllium (METAMUCIL) 58.6 % powder Take 1 packet by mouth daily.     . RESTASIS 0.05 % ophthalmic emulsion INSTILL 1 DROP IN EACH EYE Q 12 H  3  . metoprolol tartrate (LOPRESSOR) 25 MG tablet Take 0.5 tablets (12.5 mg total) by mouth 2 (two) times daily. 90 tablet 3   No current facility-administered medications for this visit.     Allergies:   Patient has no known allergies.    Social History:  The patient   reports that she has never smoked. She has never used smokeless tobacco. She reports that she does not drink alcohol or use drugs.   Family History:  The patient's family history is not on file.    ROS: All other systems are reviewed and negative. Unless otherwise mentioned in H&P    PHYSICAL EXAM: VS:  BP (!) 158/92   Pulse 87   Ht 5' 4" (1.626 m)   Wt 135 lb (61.2 kg)   SpO2 98%   BMI 23.17 kg/m  , BMI Body mass index is 23.17 kg/m. GEN: Well nourished, well developed, in no acute distress  HEENT: normal  Neck: no JVD, carotid bruits, or masses Cardiac: RRR; no murmurs, rubs, or gallops,no edema  Respiratory:  clear to auscultation bilaterally, normal work of breathing GI: soft, nontender, nondistended, + BS MS: no deformity or atrophy  Skin: warm and dry, no rash Neuro:  Strength and sensation are intact Psych: euthymic mood, full affect   EKG: The ekg ordered today demonstrates sinus rhythm heart rate of 70 bpm, no ST-T wave abnormalities, no evidence of WPW,  or Brugada.   Recent Labs: 07/09/2016: Hemoglobin 14.2; Platelets 242    Lipid Panel    Component Value Date/Time   CHOL 212 (H) 03/20/2014 1109   TRIG 96 03/20/2014 1109   HDL 67 03/20/2014 1109   CHOLHDL 3.2 03/20/2014 1109   VLDL 19 03/20/2014 1109   LDLCALC 126 (H) 03/20/2014 1109      Wt Readings from Last 3 Encounters:  07/24/16 135 lb (61.2 kg)  07/23/16 135 lb 2 oz (61.3 kg)  07/15/16 134 lb 6 oz (61 kg)    ASSESSMENT AND PLAN:  1.  Tachycardia: She has a history of PSVT but states which she is experiencing is not the same symptoms. She states this occurred occurring at rest causing her to feel lightheaded and dizzy. She has found her heart rate on a pulse ox showing it to be between 116 and 127 bpm. I'm going to place a cardiac monitor on her for 2 weeks and she states this occurs several times during the day and almost daily. I'm going to check a TSH along with magnesium and a BMET and a CBC  to evaluate for electrolyte and anemia issues. I will also check an echocardiogram for structural heart disease.  In the interim I will give her a prescription for metoprolol 12.5 mg twice a day. She is not to start this unless she has an episode of rapid heart rhythm she is to call us first. If her cardiac monitor reveals rapid heart rhythm and we hear from the company we may start her on this twice a day prior to  stopping the monitor. We'll see her back in approximately 3 weeks to one month to discuss results.  2. History of anxiety: She admits to some anxiety having to take care of her father who is ill. She is being followed by primary care physician for this. He has started her on Cymbalta along with increased dose of Xanax. She is asked me if she can take this medication while being tested for rapid heart rhythm. I've advised her that she is to take her medications as directed.  3. Hypertension: I checked orthostatics to evaluate her dizziness. She was not orthostatic. In fact her blood pressure one of 279/86 with a heart rate of 99 with standing and position change. May need to consider adding metoprolol 12.5 mg twice a day as blood pressure control medication as well. For now will follow-up after testing is complete.  Current medicines are reviewed at length with the patient today.  I have spent greater than 45 minutes with this patient listing to symptoms and discussing follow-up testing and medications..  Labs/ tests ordered today include:   Orders Placed This Encounter  Procedures  . Comprehensive Metabolic Panel (CMET)  . Magnesium  . CBC with Differential  . TSH  . Cardiac event monitor  . EKG 12-Lead  . ECHOCARDIOGRAM COMPLETE     Disposition:   FU with Cardiology in 3 weeks to 4 weeks.  Signed, Jory Sims, NP  07/24/2016 5:01 PM    [facility] [address] [place], [place] [address] Phone: [phone]; Fax: [phone]

## 2016-07-25 ENCOUNTER — Other Ambulatory Visit: Payer: Self-pay

## 2016-07-25 ENCOUNTER — Telehealth: Payer: Self-pay | Admitting: Adult Health

## 2016-07-25 DIAGNOSIS — E785 Hyperlipidemia, unspecified: Secondary | ICD-10-CM

## 2016-07-25 NOTE — Telephone Encounter (Signed)
Patient would like cholesterol added to bloodwork. / tg

## 2016-07-25 NOTE — Telephone Encounter (Signed)
I think this was ordered with labs yesterday. If not, we can order this.

## 2016-07-25 NOTE — Telephone Encounter (Signed)
Pt requests lipid panel, do you concur?

## 2016-07-26 DIAGNOSIS — M545 Low back pain: Secondary | ICD-10-CM | POA: Diagnosis not present

## 2016-07-26 LAB — MAGNESIUM: MAGNESIUM: 2.1 mg/dL (ref 1.5–2.5)

## 2016-07-26 LAB — CBC WITH DIFFERENTIAL/PLATELET
Basophils Absolute: 0 cells/uL (ref 0–200)
Basophils Relative: 0 %
EOS PCT: 2 %
Eosinophils Absolute: 110 cells/uL (ref 15–500)
HCT: 44.1 % (ref 35.0–45.0)
HEMOGLOBIN: 15.1 g/dL (ref 11.7–15.5)
LYMPHS ABS: 1705 {cells}/uL (ref 850–3900)
Lymphocytes Relative: 31 %
MCH: 34.6 pg — ABNORMAL HIGH (ref 27.0–33.0)
MCHC: 34.2 g/dL (ref 32.0–36.0)
MCV: 100.9 fL — ABNORMAL HIGH (ref 80.0–100.0)
MPV: 9 fL (ref 7.5–12.5)
Monocytes Absolute: 550 cells/uL (ref 200–950)
Monocytes Relative: 10 %
NEUTROS ABS: 3135 {cells}/uL (ref 1500–7800)
NEUTROS PCT: 57 %
Platelets: 267 10*3/uL (ref 140–400)
RBC: 4.37 MIL/uL (ref 3.80–5.10)
RDW: 12.4 % (ref 11.0–15.0)
WBC: 5.5 10*3/uL (ref 3.8–10.8)

## 2016-07-26 LAB — LIPID PANEL
CHOL/HDL RATIO: 2.7 ratio (ref <5.0)
CHOLESTEROL: 171 mg/dL (ref <200)
HDL: 63 mg/dL (ref >50)
LDL Cholesterol: 83 mg/dL (ref <100)
TRIGLYCERIDES: 123 mg/dL (ref <150)
VLDL: 25 mg/dL (ref <30)

## 2016-07-26 LAB — COMPREHENSIVE METABOLIC PANEL
ALBUMIN: 4.2 g/dL (ref 3.6–5.1)
ALT: 17 U/L (ref 6–29)
AST: 18 U/L (ref 10–35)
Alkaline Phosphatase: 31 U/L — ABNORMAL LOW (ref 33–130)
BUN: 10 mg/dL (ref 7–25)
CALCIUM: 9.3 mg/dL (ref 8.6–10.4)
CHLORIDE: 104 mmol/L (ref 98–110)
CO2: 25 mmol/L (ref 20–31)
CREATININE: 0.83 mg/dL (ref 0.50–1.05)
Glucose, Bld: 88 mg/dL (ref 65–99)
POTASSIUM: 4.4 mmol/L (ref 3.5–5.3)
Sodium: 139 mmol/L (ref 135–146)
TOTAL PROTEIN: 6.8 g/dL (ref 6.1–8.1)
Total Bilirubin: 0.9 mg/dL (ref 0.2–1.2)

## 2016-07-27 LAB — TSH: TSH: 3.05 m[IU]/L

## 2016-07-28 ENCOUNTER — Telehealth: Payer: Self-pay | Admitting: *Deleted

## 2016-07-28 DIAGNOSIS — E7849 Other hyperlipidemia: Secondary | ICD-10-CM

## 2016-07-28 NOTE — Telephone Encounter (Signed)
-----   Message from Jodelle GrossKathryn M Lawrence, NP sent at 07/28/2016  6:54 AM EST ----- All labs are normal. No problems with TSH, magnesium or evidence of anemia.

## 2016-07-28 NOTE — Telephone Encounter (Signed)
-----   Message from Jodelle GrossKathryn M Lawrence, NP sent at 07/28/2016  6:53 AM EST ----- Cholesterol is normal but LDL is not at goal for new standards. Needs to be < 70. Send low cholesterol diet. Repeat lipids and LFT's in 6 months.

## 2016-07-28 NOTE — Telephone Encounter (Signed)
Called patient with test results. No answer. Left message to call back.  

## 2016-07-28 NOTE — Telephone Encounter (Signed)
-----   Message from Kathryn M Lawrence, NP sent at 07/28/2016  6:53 AM EST ----- Cholesterol is normal but LDL is not at goal for new standards. Needs to be < 70. Send low cholesterol diet. Repeat lipids and LFT's in 6 months. 

## 2016-07-30 DIAGNOSIS — M545 Low back pain: Secondary | ICD-10-CM | POA: Diagnosis not present

## 2016-07-30 DIAGNOSIS — M5416 Radiculopathy, lumbar region: Secondary | ICD-10-CM | POA: Diagnosis not present

## 2016-08-04 ENCOUNTER — Ambulatory Visit (INDEPENDENT_AMBULATORY_CARE_PROVIDER_SITE_OTHER): Payer: BLUE CROSS/BLUE SHIELD | Admitting: Otolaryngology

## 2016-08-04 ENCOUNTER — Ambulatory Visit (HOSPITAL_COMMUNITY)
Admission: RE | Admit: 2016-08-04 | Discharge: 2016-08-04 | Disposition: A | Discharge status: Home / Self Care | Payer: BLUE CROSS/BLUE SHIELD | Source: Ambulatory Visit | Attending: Adult Health | Admitting: Adult Health

## 2016-08-04 DIAGNOSIS — H93293 Other abnormal auditory perceptions, bilateral: Secondary | ICD-10-CM

## 2016-08-04 DIAGNOSIS — R002 Palpitations: Secondary | ICD-10-CM | POA: Diagnosis not present

## 2016-08-04 DIAGNOSIS — R42 Dizziness and giddiness: Secondary | ICD-10-CM | POA: Diagnosis not present

## 2016-08-04 DIAGNOSIS — I071 Rheumatic tricuspid insufficiency: Secondary | ICD-10-CM | POA: Diagnosis not present

## 2016-08-04 DIAGNOSIS — H9202 Otalgia, left ear: Secondary | ICD-10-CM

## 2016-08-04 DIAGNOSIS — H9312 Tinnitus, left ear: Secondary | ICD-10-CM

## 2016-08-04 DIAGNOSIS — H6121 Impacted cerumen, right ear: Secondary | ICD-10-CM

## 2016-08-04 LAB — ECHOCARDIOGRAM COMPLETE
AVLVOTPG: 4 mmHg
CHL CUP DOP CALC LVOT VTI: 19.8 cm
CHL CUP MV DEC (S): 204
CHL CUP STROKE VOLUME: 39 mL
E decel time: 204 msec
EERAT: 9.37
FS: 34 % (ref 28–44)
IVS/LV PW RATIO, ED: 1
LA ID, A-P, ES: 31 mm
LA diam index: 1.86 cm/m2
LA vol A4C: 23.6 ml
LAVOL: 24.2 mL
LAVOLIN: 14.5 mL/m2
LEFT ATRIUM END SYS DIAM: 31 mm
LV SIMPSON'S DISK: 69
LV dias vol index: 34 mL/m2
LV dias vol: 56 mL (ref 46–106)
LV e' LATERAL: 9.79 cm/s
LV sys vol index: 10 mL/m2
LV sys vol: 17 mL (ref 14–42)
LVEEAVG: 9.37
LVEEMED: 9.37
LVOT area: 2.84 cm2
LVOT diameter: 19 mm
LVOTPV: 101 cm/s
LVOTSV: 56 mL
Lateral S' vel: 14.3 cm/s
MV pk A vel: 99 m/s
MV pk E vel: 91.7 m/s
MVPG: 3 mmHg
PW: 8.17 mm — AB (ref 0.6–1.1)
TAPSE: 23.6 mm
TDI e' lateral: 9.79
TDI e' medial: 6.42

## 2016-08-04 NOTE — Progress Notes (Signed)
*  PRELIMINARY RESULTS* Echocardiogram 2D Echocardiogram has been performed.  Stacey DrainWhite, Gale Hulse J 08/04/2016, 1:47 PM

## 2016-08-05 DIAGNOSIS — M5416 Radiculopathy, lumbar region: Secondary | ICD-10-CM | POA: Diagnosis not present

## 2016-08-06 ENCOUNTER — Ambulatory Visit: Payer: BLUE CROSS/BLUE SHIELD | Admitting: Physician Assistant

## 2016-08-07 DIAGNOSIS — R202 Paresthesia of skin: Secondary | ICD-10-CM | POA: Diagnosis not present

## 2016-08-07 DIAGNOSIS — S41151D Open bite of right upper arm, subsequent encounter: Secondary | ICD-10-CM | POA: Diagnosis not present

## 2016-08-11 ENCOUNTER — Telehealth: Payer: Self-pay | Admitting: *Deleted

## 2016-08-11 MED ORDER — METOPROLOL TARTRATE 25 MG PO TABS: ORAL_TABLET | ORAL | 6 refills | Status: DC

## 2016-08-11 NOTE — Telephone Encounter (Signed)
-----   Message from Jodelle GrossKathryn M Lawrence, NP sent at 08/10/2016 12:33 PM EST ----- Reviewed monitor. Avg HR 90 bpm. Please increase metoprolol to 50 mg in the am and 25 mg in pm. Will discuss her symptoms and response on follow up appointment.

## 2016-08-12 DIAGNOSIS — M5416 Radiculopathy, lumbar region: Secondary | ICD-10-CM | POA: Diagnosis not present

## 2016-08-13 ENCOUNTER — Encounter (INDEPENDENT_AMBULATORY_CARE_PROVIDER_SITE_OTHER): Payer: Self-pay | Admitting: Internal Medicine

## 2016-08-13 DIAGNOSIS — M5416 Radiculopathy, lumbar region: Secondary | ICD-10-CM | POA: Diagnosis not present

## 2016-08-15 ENCOUNTER — Ambulatory Visit: Payer: BLUE CROSS/BLUE SHIELD | Admitting: Neurology

## 2016-08-18 DIAGNOSIS — J3081 Allergic rhinitis due to animal (cat) (dog) hair and dander: Secondary | ICD-10-CM | POA: Diagnosis not present

## 2016-08-18 DIAGNOSIS — J3089 Other allergic rhinitis: Secondary | ICD-10-CM | POA: Diagnosis not present

## 2016-08-18 DIAGNOSIS — R51 Headache: Secondary | ICD-10-CM | POA: Diagnosis not present

## 2016-08-18 DIAGNOSIS — J301 Allergic rhinitis due to pollen: Secondary | ICD-10-CM | POA: Diagnosis not present

## 2016-08-18 DIAGNOSIS — J309 Allergic rhinitis, unspecified: Secondary | ICD-10-CM | POA: Diagnosis not present

## 2016-08-19 ENCOUNTER — Encounter: Payer: Self-pay | Admitting: Adult Health

## 2016-08-19 ENCOUNTER — Ambulatory Visit (INDEPENDENT_AMBULATORY_CARE_PROVIDER_SITE_OTHER): Payer: BLUE CROSS/BLUE SHIELD | Admitting: Adult Health

## 2016-08-19 VITALS — BP 114/72 | HR 56 | Ht 64.0 in | Wt 135.0 lb

## 2016-08-19 DIAGNOSIS — R002 Palpitations: Secondary | ICD-10-CM

## 2016-08-19 DIAGNOSIS — F418 Other specified anxiety disorders: Secondary | ICD-10-CM

## 2016-08-19 NOTE — Progress Notes (Signed)
Cardiology Office Note   Date:  08/19/2016   ID:  Victoria [name], Victoria [name] Victoria [name], MRN [id number]  PCP:  Mickie Hillier, MD  Cardiologist: Woodroe Chen, NP   Chief Complaint  Patient presents with  . Palpitations      History of Present Illness: Victoria [name] is a 55 y.o. female who presents for ongoing assessment and management of tachycardia, palpitations, prior history of PSVT, normal systolic function with grade 1 diastolic dysfunction per echo, hypertension, with history to include GERD and anxiety.  The patient was last seen with multiple somatic complaints to include rapid heart rhythm. Labs were completed echocardiogram was completed and cardiac monitor was placed to evaluate for heart rate abnormalities with her complaints of tachycardia.  - Left ventricle: The cavity size was normal. Wall thickness was   normal. Systolic function was normal. The estimated ejection   fraction was in the range of 55% to 60%. Wall motion was normal;   there were no regional wall motion abnormalities. Doppler   parameters are consistent with abnormal left ventricular   relaxation (grade 1 diastolic dysfunction). - Mitral valve: There was trivial regurgitation. - Right atrium: Central venous pressure (est): 3 mm Hg. - Pulmonary arteries: Systolic pressure could not be accurately   estimated. - Pericardium, extracardiac: There was no pericardial effusion.   Cardiac monitor revealed a heart rate of 90 bpm on average with occasional episodes of PVCs. I increased her metoprolol to 50 mg in the a.m. and 25 mg in the p.m. She is here for ongoing management.    She comes today with a list of multiple somatic complaints which she has dated and described in note book. She is not consistently taking metoprolol doses as directed. She states that sometimes she takes one tablet in the morning sometimes she takes 1-1/2 tablets in the morning depending on how she feels. She usually takes her  bedtime dose as directed. She is also not taking Cymbalta as directed by her primary care physician. She wishes to continue to see other physicians for their opinions concerning the medication she is taking. She's had several episodes of it appears to be panic attacks user driving or at home.  Past Medical History:  Diagnosis Date  . Asthma   . GERD (gastroesophageal reflux disease)     Past Surgical History:  Procedure Laterality Date  . COLONOSCOPY       Current Outpatient Prescriptions  Medication Sig Dispense Refill  . ALPRAZolam (XANAX) 0.5 MG tablet Take 1/2 - 1 tablet po BID PRN 60 tablet 2  . ANALPRAM-HC 1-1 % rectal cream APPLY TO THE AFFECTED BID  12  . Calcium-Magnesium-Vitamin D (CALCIUM 500 PO) Take by mouth.    . cyclobenzaprine (FLEXERIL) 10 MG tablet Take 1 tablet (10 mg total) by mouth 3 (three) times daily as needed. for muscle spams 40 tablet 3  . dicyclomine (BENTYL) 10 MG capsule TAKE 1 CAPSULE(10 MG) BY MOUTH THREE TIMES DAILY 90 capsule 3  . DULoxetine (CYMBALTA) 30 MG capsule Take 1 capsule (30 mg total) by mouth daily. 30 capsule 2  . HYDROcodone-acetaminophen (NORCO/VICODIN) 5-325 MG tablet Take one every 4 - 6 hours prn pain 36 tablet 0  . hydrocortisone cream 0.5 % APPLY RECTALLY TWICE DAILY AS DIRECTED FOR 7 DAYS 56 g 5  . lidocaine (LINDAMANTLE) 3 % CREA cream APPLY RECTALLY TWICE DAILY AS DIRECTED FOR 7 DAYS 85 g 5  . lidocaine-hydrocortisone (ANAMANTEL HC) 3-0.5 % CREA APPLY RECTALLY  TWICE DAILY AS DIRECTED FOR 7 DAYS 85 g 5  . Lidocaine-Hydrocortisone Ace 3-0.5 % KIT APPLY RECTALLY TWICE DAILY AS DIRECTED FOR 7 DAYS 1 each 5  . LO LOESTRIN FE 1 MG-10 MCG / 10 MCG tablet TAKE 1 TABLET BY MOUTH EVERY DAY 28 tablet 0  . Magnesium Gluconate (MAGNESIUM 27 PO) Take by mouth.    . metoprolol tartrate (LOPRESSOR) 25 MG tablet TAKE 50 MG IN THE AM AND TAKE 25 MG IN THE PM (Patient taking differently: TAKE 50 MG IN THE AM AND TAKE 25 MG IN THE PM) 90 tablet 6  .  metroNIDAZOLE (FLAGYL) 250 MG tablet TK 1 T PO TID FOR 7 DAYS  0  . metroNIDAZOLE (METROGEL) 1 % gel APPLY AA ON FACE QHS  8  . Multiple Vitamin (MULTIVITAMIN) tablet Take 1 tablet by mouth daily.    . mupirocin ointment (BACTROBAN) 2 % APP TOPICALLY AA TID FOR 7-10 DAYS 22 g 3  . omeprazole (PRILOSEC) 20 MG capsule Take 1 capsule (20 mg total) by mouth daily. 90 capsule 3  . polyethylene glycol (MIRALAX / GLYCOLAX) packet Take 17 g by mouth daily.    . Probiotic Product (RESTORA PO) Take by mouth.    . psyllium (METAMUCIL) 58.6 % powder Take 1 packet by mouth daily.     . RESTASIS 0.05 % ophthalmic emulsion INSTILL 1 DROP IN EACH EYE Q 12 H  3   No current facility-administered medications for this visit.     Allergies:   Patient has no known allergies.    Social History:  The patient  reports that she has never smoked. She has never used smokeless tobacco. She reports that she does not drink alcohol or use drugs.   Family History:  The patient's family history is not on file.    ROS: All other systems are reviewed and negative. Unless otherwise mentioned in H&P    PHYSICAL EXAM: VS:  BP 114/72   Pulse (!) 56   Ht 5' 4"  (1.626 m)   Wt 135 lb (61.2 kg)   SpO2 95%   BMI 23.17 kg/m  , BMI Body mass index is 23.17 kg/m. GEN: Well nourished, well developed, in no acute distress  HEENT: normal  Neck: no JVD, carotid bruits, or masses Cardiac: RRR; no murmurs, rubs, or gallops,no edema  Respiratory:  clear to auscultation bilaterally, normal work of breathing GI: soft, nontender, nondistended, + BS MS: no deformity or atrophy  Skin: warm and dry, no rash Neuro:  Strength and sensation are intact Psych: euthymic mood, full affect   Recent Labs: 07/24/2016: ALT 17; BUN 10; Creat 0.83; Hemoglobin 15.1; Magnesium 2.1; Platelets 267; Potassium 4.4; Sodium 139; TSH 3.05    Lipid Panel    Component Value Date/Time   CHOL 171 07/25/2016 1106   TRIG 123 07/25/2016 1106   HDL 63  07/25/2016 1106   CHOLHDL 2.7 07/25/2016 1106   VLDL 25 07/25/2016 1106   LDLCALC 83 07/25/2016 1106      Wt Readings from Last 3 Encounters:  08/19/16 135 lb (61.2 kg)  07/24/16 135 lb (61.2 kg)  07/23/16 135 lb 2 oz (61.3 kg)     ASSESSMENT AND PLAN:  1.  Palpitations: Her symptoms correlate with heart rate of 90 bpm on her cardiac monitor. All of her cardiac testing is come back normal. She is not consistently taking metoprolol as directed. She states she only wants to take 25 mg 1-1/2 tablets daily as she  is able to tolerate this, and will continue to take 25 mg at at bedtime. She still has some breakthrough which she describes as "rapid heart rhythm" and is very sensitive to any abnormalities that she perceives in her body.  I've advised her that she is to consistently take her medications as directed, and agree with allowing her to take 25 mg 1-1/2 tablet in the morning and 25 mg at night. No further cardiac testing is planned are necessary at this time.  2. Anxiety over her health status: She is very sensitive to any abnormalities in her heart rhythm, readings status, or general symptoms. She has not started taking Cymbalta as prescribed by her primary care physician. She chooses to take her medications when she feels that it is helpful to her. She is to follow-up with her primary care physician for possible referral to psychiatric counseling if clinically warranted. She admits to having a good bit of anxiety. We'll defer to primary care for timing of this referral.   Current medicines are reviewed at length with the patient today.    Labs/ tests ordered today include:  No orders of the defined types were placed in this encounter.    Disposition:   FU with 6 months with Dr. Bronson Ing Signed, Jory Sims, NP  08/19/2016 1:43 PM    [facility]. [address] [place], [place] [address] Phone: [phone]; Fax: [phone]

## 2016-08-19 NOTE — Progress Notes (Signed)
Name: Victoria [name]    DOB: 10/01/1960  Age: 55 y.o.  MR#: [id number]       PCP:  Mickie Hillier, MD      Insurance: Payor: [employer] / Plan: [employer] / Product Type: *No Product type* /   CC:   No chief complaint on file.   VS There were no vitals filed for this visit.  Weights Current Weight  07/24/16 135 lb (61.2 kg)  07/23/16 135 lb 2 oz (61.3 kg)  07/15/16 134 lb 6 oz (61 kg)    Blood Pressure  BP Readings from Last 3 Encounters:  07/24/16 (!) 158/92  07/23/16 124/86  07/15/16 136/90     Admit date:  (Not on file) Last encounter with R[facility]:  07/25/2016   Allergy Patient has no known allergies.  Current Outpatient Prescriptions  Medication Sig Dispense Refill  . ALPRAZolam (XANAX) 0.5 MG tablet Take 1/2 - 1 tablet po BID PRN 60 tablet 2  . ANALPRAM-HC 1-1 % rectal cream APPLY TO THE AFFECTED BID  12  . Calcium-Magnesium-Vitamin D (CALCIUM 500 PO) Take by mouth.    . cyclobenzaprine (FLEXERIL) 10 MG tablet Take 1 tablet (10 mg total) by mouth 3 (three) times daily as needed. for muscle spams 40 tablet 3  . dicyclomine (BENTYL) 10 MG capsule TAKE 1 CAPSULE(10 MG) BY MOUTH THREE TIMES DAILY 90 capsule 3  . DULoxetine (CYMBALTA) 30 MG capsule Take 1 capsule (30 mg total) by mouth daily. 30 capsule 2  . HYDROcodone-acetaminophen (NORCO/VICODIN) 5-325 MG tablet Take one every 4 - 6 hours prn pain 36 tablet 0  . hydrocortisone cream 0.5 % APPLY RECTALLY TWICE DAILY AS DIRECTED FOR 7 DAYS 56 g 5  . lidocaine (LINDAMANTLE) 3 % CREA cream APPLY RECTALLY TWICE DAILY AS DIRECTED FOR 7 DAYS 85 g 5  . lidocaine-hydrocortisone (ANAMANTEL HC) 3-0.5 % CREA APPLY RECTALLY TWICE DAILY AS DIRECTED FOR 7 DAYS 85 g 5  . Lidocaine-Hydrocortisone Ace 3-0.5 % KIT APPLY RECTALLY TWICE DAILY AS DIRECTED FOR 7 DAYS 1 each 5  . LO LOESTRIN FE 1 MG-10 MCG / 10 MCG tablet TAKE 1 TABLET BY MOUTH EVERY DAY 28 tablet 0  . Magnesium Gluconate (MAGNESIUM 27 PO) Take by mouth.    . metoprolol  tartrate (LOPRESSOR) 25 MG tablet TAKE 50 MG IN THE AM AND TAKE 25 MG IN THE PM 90 tablet 6  . metroNIDAZOLE (FLAGYL) 250 MG tablet TK 1 T PO TID FOR 7 DAYS  0  . metroNIDAZOLE (METROGEL) 1 % gel APPLY AA ON FACE QHS  8  . Multiple Vitamin (MULTIVITAMIN) tablet Take 1 tablet by mouth daily.    . mupirocin ointment (BACTROBAN) 2 % APP TOPICALLY AA TID FOR 7-10 DAYS 22 g 3  . omeprazole (PRILOSEC) 20 MG capsule Take 1 capsule (20 mg total) by mouth daily. 90 capsule 3  . polyethylene glycol (MIRALAX / GLYCOLAX) packet Take 17 g by mouth daily.    . Probiotic Product (RESTORA PO) Take by mouth.    . psyllium (METAMUCIL) 58.6 % powder Take 1 packet by mouth daily.     . RESTASIS 0.05 % ophthalmic emulsion INSTILL 1 DROP IN EACH EYE Q 12 H  3   No current facility-administered medications for this visit.     Discontinued Meds:   There are no discontinued medications.  Patient Active Problem List   Diagnosis Date Noted  . Palpitations 07/21/2014  . Tachycardia 07/21/2014  . Irritable  bowel syndrome 09/11/2013  . Insomnia 09/11/2013  . Generalized anxiety disorder 09/11/2013  . Esophageal reflux 09/11/2013  . Allergic rhinitis 09/11/2013    LABS    Component Value Date/Time   NA 139 07/24/2016 1127   NA 140 03/20/2014 1109   K 4.4 07/24/2016 1127   K 4.3 03/20/2014 1109   CL 104 07/24/2016 1127   CL 105 03/20/2014 1109   CO2 25 07/24/2016 1127   CO2 29 03/20/2014 1109   GLUCOSE 88 07/24/2016 1127   GLUCOSE 93 03/20/2014 1109   BUN 10 07/24/2016 1127   BUN 14 03/20/2014 1109   CREATININE 0.83 07/24/2016 1127   CREATININE 0.73 03/20/2014 1109   CALCIUM 9.3 07/24/2016 1127   CALCIUM 9.1 03/20/2014 1109   CMP     Component Value Date/Time   NA 139 07/24/2016 1127   K 4.4 07/24/2016 1127   CL 104 07/24/2016 1127   CO2 25 07/24/2016 1127   GLUCOSE 88 07/24/2016 1127   BUN 10 07/24/2016 1127   CREATININE 0.83 07/24/2016 1127   CALCIUM 9.3 07/24/2016 1127   PROT 6.8  07/24/2016 1127   ALBUMIN 4.2 07/24/2016 1127   AST 18 07/24/2016 1127   ALT 17 07/24/2016 1127   ALKPHOS 31 (L) 07/24/2016 1127   BILITOT 0.9 07/24/2016 1127       Component Value Date/Time   WBC 5.5 07/24/2016 1127   WBC 9.7 07/09/2016 1645   WBC 5.0 03/20/2014 1109   HGB 15.1 07/24/2016 1127   HGB 14.2 07/09/2016 1645   HGB 14.6 03/20/2014 1109   HCT 44.1 07/24/2016 1127   HCT 41.4 07/09/2016 1645   HCT 42.7 03/20/2014 1109   MCV 100.9 (H) 07/24/2016 1127   MCV 100.5 (H) 07/09/2016 1645   MCV 97.3 03/20/2014 1109    Lipid Panel     Component Value Date/Time   CHOL 171 07/25/2016 1106   TRIG 123 07/25/2016 1106   HDL 63 07/25/2016 1106   CHOLHDL 2.7 07/25/2016 1106   VLDL 25 07/25/2016 1106   LDLCALC 83 07/25/2016 1106    ABG No results found for: PHART, PCO2ART, PO2ART, HCO3, TCO2, ACIDBASEDEF, O2SAT   Lab Results  Component Value Date   TSH 3.05 07/24/2016   BNP (last 3 results) No results for input(s): BNP in the last 8760 hours.  ProBNP (last 3 results) No results for input(s): PROBNP in the last 8760 hours.  Cardiac Panel (last 3 results) No results for input(s): CKTOTAL, CKMB, TROPONINI, RELINDX in the last 72 hours.  Iron/TIBC/Ferritin/ %Sat No results found for: IRON, TIBC, FERRITIN, IRONPCTSAT   EKG Orders placed or performed in visit on 07/24/16  . EKG 12-Lead     Prior Assessment and Plan Problem List as of 08/19/2016 Reviewed: 07/24/2016  5:08 PM by Jory Sims, NP     Respiratory   Allergic rhinitis     Digestive   Irritable bowel syndrome   Esophageal reflux     Other   Palpitations   Tachycardia   Insomnia   Generalized anxiety disorder       Imaging: No results found.

## 2016-08-19 NOTE — Patient Instructions (Addendum)
Your physician wants you to follow-up in: 6 months with dr Reggy Eyekoneswaran You will receive a reminder letter in the mail two months in advance. If you don't receive a letter, please call our office to schedule the follow-up appointment.   Your physician recommends that you continue on your current medications as directed. Please refer to the Current Medication list given to you today.    Thank you for choosing [facility] !

## 2016-08-20 ENCOUNTER — Ambulatory Visit (INDEPENDENT_AMBULATORY_CARE_PROVIDER_SITE_OTHER): Payer: BLUE CROSS/BLUE SHIELD | Admitting: Neurology

## 2016-08-20 ENCOUNTER — Encounter: Payer: Self-pay | Admitting: Neurology

## 2016-08-20 VITALS — BP 172/94 | HR 72 | Resp 16 | Ht 64.0 in | Wt 136.0 lb

## 2016-08-20 DIAGNOSIS — F411 Generalized anxiety disorder: Secondary | ICD-10-CM | POA: Diagnosis not present

## 2016-08-20 DIAGNOSIS — K589 Irritable bowel syndrome without diarrhea: Secondary | ICD-10-CM

## 2016-08-20 DIAGNOSIS — R42 Dizziness and giddiness: Secondary | ICD-10-CM | POA: Insufficient documentation

## 2016-08-20 DIAGNOSIS — R000 Tachycardia, unspecified: Secondary | ICD-10-CM

## 2016-08-20 DIAGNOSIS — R4789 Other speech disturbances: Secondary | ICD-10-CM | POA: Diagnosis not present

## 2016-08-20 DIAGNOSIS — R5383 Other fatigue: Secondary | ICD-10-CM | POA: Insufficient documentation

## 2016-08-20 NOTE — Progress Notes (Signed)
[facility]  PATIENT: Victoria [name] DOB: 29-Apr-1961  REFERRING DOCTOR OR PCP:  Ardyth Gal SOURCE: patient, notes from Dr. Gerda Diss, MRI images on CD  _________________________________   HISTORICAL  CHIEF COMPLAINT:  Chief Complaint  Patient presents with  . Dizziness    Former pt. of Dr. Bonnita Hollow from [facility].  Victoria [name] is here for eval of vertigo.  Sts. she had severe vertigo in 2012.  She had an mri brain at that time and has a cd of mri with her today.  Sts. since then, she has had intermittent episodes of recurrent vertigo.  Sts. onset 2 mos ago of more frequent episodes of dizziness.  Sts. she has alot of pressure in her head, feels as if she has bugs in her head that are moving around. Sts. she suffered a dog bite to her right upper arm 6 mos. ago and since then has  . Headache    suffered with increased anxiety/depression, nerve damage right upper arm, back pain.  Sts. she would like an MRI brain to r/o a tumor/fim    HISTORY OF PRESENT ILLNESS:  I had the pleasure of seeing your patient, Victoria [name], at [facility] for a neurologic consultation regarding her vertigo.  She is a 55 yo woman who had a 4 month period of vertigo in 2012 that resolved.  Moving her head would trigger a one minute episode of severe vertigo and she had witnessed rotatory nystagmus, consistent with a benign positional vertigo.    However, since then, she would get spells of milder vertigo lasting for hours or at most a day.  These milder spells occurred frequently.   She found that 1/4 xanax would knock the symptoms out.    Some spells were triggered by movement but others weren't.   She had some heart palpitations and had a heart monitor showing an increased rate at times and some PVC's but no arrhythmia.    She was placed on metoprolol.    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   She has a pressure-like sensation near the eyes in her head.  Moving her eyes sometimes increases the sensation.     She has had intermittent headache in the vertex region.     She gets a pulsating sensation in her head "like something moving around'.    Some of there lightheaded spells occur while sitting at a table or driving and also while standing.   However, spells do not occur with laying down.     She has pain in the back of the head when she lays down flat in bed.    Sometimes, this pain evolves into a more generalized headache.   She also gets a full sensation in her sinuses but CT of the sinuses was ok.      She denies major gait issues but notes she sometimes knocks into the furniture or a wall while walking.   She denies bladder issues. She sleeps well most nights.  She notes fatigue.  She has noted that she is having some verbal fluency issues and mispronounces words all the time.      I personally reviewed an MRI dated 01/10/2011 and it is normal.   I reviewed notes from 2012 from [facility]   REVIEW OF SYSTEMS: Constitutional: No fevers, chills, sweats, or change in appetite.   She  notes fatigue Eyes: No visual changes, double vision, eye pain Ear, nose and throat: No hearing loss, ear pain, nasal congestion, sore throat.   Also see above. Cardiovascular: No chest pain.  She has h/o PSVT and palpitations with tachycardia. Respiratory: No shortness of breath at rest or with exertion.   No wheezes GastrointestinaI: reports irritable bowel Genitourinary: No dysuria, urinary retention or frequency.  No nocturia. Musculoskeletal: No neck pain.   She notes lowerback pain Integumentary: Dog bite scar upper right arm.  No rash, pruritus, skin lesions Neurological: as above Psychiatric: Notes anxiety.   Denies significant depression. Endocrine: No palpitations, diaphoresis, change in appetite, change in weigh or increased thirst Hematologic/Lymphatic: No anemia,  purpura, petechiae. Allergic/Immunologic: No itchy/runny eyes, nasal congestion, recent allergic reactions, rashes  ALLERGIES: No Known Allergies  HOME MEDICATIONS:  Current Outpatient Prescriptions:  .  ALPRAZolam (XANAX) 0.5 MG tablet, Take 1/2 - 1 tablet po BID PRN, Disp: 60 tablet, Rfl: 2 .  ANALPRAM-HC 1-1 % rectal cream, APPLY TO THE AFFECTED BID, Disp: , Rfl: 12 .  Calcium-Magnesium-Vitamin D (CALCIUM 500 PO), Take by mouth., Disp: , Rfl:  .  cyclobenzaprine (FLEXERIL) 10 MG tablet, Take 1 tablet (10 mg total) by mouth 3 (three) times daily as needed. for muscle spams, Disp: 40 tablet, Rfl: 3 .  HYDROcodone-acetaminophen (NORCO/VICODIN) 5-325 MG tablet, Take one every 4 - 6 hours prn pain, Disp: 36 tablet, Rfl: 0 .  hydrocortisone cream 0.5 %, APPLY RECTALLY TWICE DAILY AS DIRECTED FOR 7 DAYS, Disp: 56 g, Rfl: 5 .  LO LOESTRIN FE 1 MG-10 MCG / 10 MCG tablet, TAKE 1 TABLET BY MOUTH EVERY DAY, Disp: 28 tablet, Rfl: 0 .  Magnesium Gluconate (MAGNESIUM 27 PO), Take by mouth., Disp: , Rfl:  .  metoprolol tartrate (LOPRESSOR) 25 MG tablet, TAKE 50 MG IN THE AM AND TAKE 25 MG IN THE PM (Patient taking differently: TAKE 50 MG IN THE AM AND TAKE 25 MG IN THE PM), Disp: 90 tablet, Rfl: 6 .  metroNIDAZOLE (METROGEL) 1 % gel, APPLY AA ON FACE QHS, Disp: , Rfl: 8 .  Multiple Vitamin (MULTIVITAMIN) tablet, Take 1 tablet by mouth daily., Disp: , Rfl:  .  mupirocin ointment (BACTROBAN) 2 %, APP TOPICALLY AA TID FOR 7-10 DAYS, Disp: 22 g, Rfl: 3 .  omeprazole (PRILOSEC) 20 MG capsule, Take 1 capsule (20 mg total) by mouth daily., Disp: 90 capsule, Rfl: 3 .  polyethylene glycol (MIRALAX / GLYCOLAX) packet, Take 17 g by mouth daily., Disp: , Rfl:  .  Probiotic Product (RESTORA PO), Take by mouth., Disp: , Rfl:  .  psyllium (METAMUCIL) 58.6 % powder, Take 1 packet by mouth daily. , Disp: , Rfl:  .  RESTASIS 0.05 % ophthalmic emulsion, INSTILL 1 DROP IN EACH EYE Q 12 H, Disp: , Rfl: 3 .  dicyclomine  (BENTYL) 10 MG capsule, TAKE 1 CAPSULE(10 MG) BY MOUTH THREE TIMES DAILY (Patient not taking: Reported on 08/20/2016), Disp: 90 capsule, Rfl: 3 .  DULoxetine (CYMBALTA) 30 MG capsule, Take 1 capsule (30 mg total) by mouth daily. (Patient not taking: Reported on 08/20/2016), Disp: 30 capsule, Rfl: 2  PAST MEDICAL HISTORY: Past Medical History:  Diagnosis Date  . Asthma   . GERD (gastroesophageal reflux disease)   . GERD (gastroesophageal reflux disease)   . Headache   . Hypertension   . Scoliosis   . Vertigo     PAST SURGICAL HISTORY: Past Surgical History:  Procedure Laterality Date  .  COLONOSCOPY      FAMILY HISTORY: No family history on file.  SOCIAL HISTORY:  Social History   Social History  . Marital status: Married    Spouse name: N/A  . Number of children: N/A  . Years of education: N/A   Occupational History  . Not on file.   Social History Main Topics  . Smoking status: Never Smoker  . Smokeless tobacco: Never Used  . Alcohol use No  . Drug use: No  . Sexual activity: Yes    Partners: Male   Other Topics Concern  . Not on file   Social History Narrative  . No narrative on file     PHYSICAL EXAM  Vitals:   08/20/16 1332  BP: (!) 172/94  Pulse: 72  Resp: 16  Weight: 136 lb (61.7 kg)  Height: 5\' 4"  (1.626 m)    Body mass index is 23.34 kg/m.   General: The patient is well-developed and well-nourished and in no acute distress  Eyes:  Funduscopic exam shows normal optic discs and retinal vessels.  Neck: The neck is supple, no carotid bruits are noted.  The neck is nontender.  Cardiovascular: The heart has a regular rate and rhythm with a normal S1 and S2. There were no murmurs, gallops or rubs. Lungs are clear to auscultation.  Skin: Extremities are without significant edema.  Musculoskeletal:  Back is nontender  Neurologic Exam  Mental status: The patient is alert and oriented x 3 at the time of the examination. The patient has  apparent normal recent and remote memory, with an apparently normal attention span and concentration ability.   A few times, she had difficulty coming up with the right word.  Cranial nerves: Extraocular movements are full. Pupils are equal, round, and reactive to light and accomodation.  Visual fields are full.  Facial symmetry is present. There is good facial sensation to soft touch bilaterally.Facial strength is normal.  Trapezius and sternocleidomastoid strength is normal. No dysarthria is noted.  The tongue is midline, and the patient has symmetric elevation of the soft palate. No obvious hearing deficits are noted.  Motor:  Muscle bulk is normal.   Tone is normal. Strength is  5 / 5 in all 4 extremities.   Sensory: Sensory testing is intact to pinprick, soft touch and vibration sensation in all 4 extremities.  Coordination: Cerebellar testing reveals good finger-nose-finger and heel-to-shin bilaterally.  Gait and station: Station is normal.   Gait is normal. Tandem gait is mildly wide. Romberg is negative.   Reflexes: Deep tendon reflexes are symmetric and normal bilaterally.   Plantar responses are flexor.    DIAGNOSTIC DATA (LABS, IMAGING, TESTING) - I reviewed patient records, labs, notes, testing and imaging myself where available.  Lab Results  Component Value Date   WBC 5.5 07/24/2016   HGB 15.1 07/24/2016   HCT 44.1 07/24/2016   MCV 100.9 (H) 07/24/2016   PLT 267 07/24/2016      Component Value Date/Time   NA 139 07/24/2016 1127   K 4.4 07/24/2016 1127   CL 104 07/24/2016 1127   CO2 25 07/24/2016 1127   GLUCOSE 88 07/24/2016 1127   BUN 10 07/24/2016 1127   CREATININE 0.83 07/24/2016 1127   CALCIUM 9.3 07/24/2016 1127   PROT 6.8 07/24/2016 1127   ALBUMIN 4.2 07/24/2016 1127   AST 18 07/24/2016 1127   ALT 17 07/24/2016 1127   ALKPHOS 31 (L) 07/24/2016 1127   BILITOT 0.9 07/24/2016 1127  Lab Results  Component Value Date   CHOL 171 07/25/2016   HDL 63  07/25/2016   LDLCALC 83 07/25/2016   TRIG 123 07/25/2016   CHOLHDL 2.7 07/25/2016   No results found for: HGBA1C Lab Results  Component Value Date   VITAMINB12 998 (H) 03/20/2014   Lab Results  Component Value Date   TSH 3.05 07/24/2016       ASSESSMENT AND PLAN    Dizziness - Plan: MR BRAIN WO CONTRAST, CANCELED: MR BRAIN W WO CONTRAST  Tachycardia  Lightheaded - Plan: Sedimentation rate, ANA w/Reflex  Generalized anxiety disorder  Irritable bowel syndrome, unspecified type  Other fatigue - Plan: Sedimentation rate, ANA w/Reflex  Verbal fluency disorder - Plan: MR BRAIN WO CONTRAST, CANCELED: MR BRAIN W WO CONTRAST   In summary, Victoria [name] is a 55 year old woman with dizziness and intermittent tachycardia, that is sometimes positional. She also has noted some difficulty with word finding at times over the last couple of months and has felt much more fatigued. The etiology is not clear.  She did not have orthostatic hypotension or tachycardia on exam today. Due to her new symptoms of dizziness and difficulties with verbal fluency, we need to obtain an MRI of the brain to evaluate cerebellopontine angles and also make sure that there is not an inflammatory process such as multiple sclerosis that could explain her symptoms. She had a reaction to contrast in the past and we'll need to get the MRI without contrast.    Her symptoms are not typical for POTS (positional orthostatic tachycardic syndrome) though she does have a rapid heart rate at times and much more fatigue so it is still a possibility. I would be reluctant to use fludrocortisone or Midrin as her blood pressure ids elevated today. If the studies are normal, consider a trial of pyridostigmine which might help her symptoms.  She also has some anxiety though this has been a chronic issue so I do not think that it is playing a predominant role in her symptoms.  She will return to see me in 6-8 weeks or sooner if there are  new or worsening neurologic symptoms.  Victoria [name] A. Epimenio FootSater, MD, PhD 08/20/2016, 5:13 PM Certified in Neurology, Clinical Neurophysiology, Sleep Medicine, Pain Medicine and Neuroimaging  [facility] [address], [address] [place], [place] [address] [phone]

## 2016-08-21 LAB — ANA W/REFLEX: Anti Nuclear Antibody(ANA): NEGATIVE

## 2016-08-21 LAB — SEDIMENTATION RATE: Sed Rate: 2 mm/hr (ref 0–40)

## 2016-08-22 ENCOUNTER — Telehealth: Payer: Self-pay | Admitting: *Deleted

## 2016-08-22 NOTE — Telephone Encounter (Signed)
-----   Message from Asa Lenteichard A Sater, MD sent at 08/22/2016 10:27 AM EST ----- Please let her know that the lab work was normal. We will call her back with the results of the MRI a couple days after they are done.

## 2016-08-22 NOTE — Telephone Encounter (Signed)
I have spoken with Victoria [name] this morning, and per RAS, advised that labwork done in our office is normal; we will call her with MRI results a couple of days after it is done.  She verbalized understanding of same/fim

## 2016-08-25 ENCOUNTER — Telehealth: Payer: Self-pay | Admitting: Adult Health

## 2016-08-25 MED ORDER — METOPROLOL TARTRATE 25 MG PO TABS: ORAL_TABLET | ORAL | 1 refills | Status: DC

## 2016-08-25 NOTE — Telephone Encounter (Signed)
Please confirm, at recent visit, she stated she only took metoprolol 25 mg 1 1/2 tabs in the am  And 25 mg pm, now she wants rx for 50 mg in am and 25 mg pm.Do you concur ?

## 2016-08-25 NOTE — Telephone Encounter (Signed)
LM for pt to call back.Per Victoria PhoK Lawrence NP,pt should take with NP prescribed which is 37.5 mg not 50 mg metoprolol in the am and 25 mg in the pm

## 2016-08-25 NOTE — Telephone Encounter (Signed)
On last office visit she did not want to take the prescribed dose of 50 in am and 25 in pm. She only wanted to take 25 mg 1 1/2 tablet in the am and 25 in the pm. She did not like taking so much medication. She said that she felt very weak and dizzy on the higher dose (50 mg in am). She should be given Rx for metoprolol 25 mg 1 1/2 in am and 25 mg in pm. Due to her self reported symptoms would not encourage increased doses. She is not always taking the medication as directed at the current dose per my note.

## 2016-08-25 NOTE — Telephone Encounter (Signed)
Needs RX for new dosage on Metoprolol sent to [facility]. Needs 3 month supply. States that she is taking 2 in the AM and 1 in the PM. / tg

## 2016-08-26 DIAGNOSIS — M5416 Radiculopathy, lumbar region: Secondary | ICD-10-CM | POA: Diagnosis not present

## 2016-08-26 DIAGNOSIS — H5713 Ocular pain, bilateral: Secondary | ICD-10-CM | POA: Diagnosis not present

## 2016-08-27 DIAGNOSIS — M5431 Sciatica, right side: Secondary | ICD-10-CM | POA: Diagnosis not present

## 2016-08-27 DIAGNOSIS — M545 Low back pain: Secondary | ICD-10-CM | POA: Diagnosis not present

## 2016-08-27 DIAGNOSIS — M5416 Radiculopathy, lumbar region: Secondary | ICD-10-CM | POA: Diagnosis not present

## 2016-08-28 ENCOUNTER — Ambulatory Visit
Admission: RE | Admit: 2016-08-28 | Discharge: 2016-08-28 | Disposition: A | Discharge status: Home / Self Care | Payer: BLUE CROSS/BLUE SHIELD | Source: Ambulatory Visit | Attending: Neurology | Admitting: Neurology

## 2016-08-28 ENCOUNTER — Encounter: Payer: Self-pay | Admitting: Radiology

## 2016-08-28 DIAGNOSIS — R42 Dizziness and giddiness: Secondary | ICD-10-CM | POA: Diagnosis not present

## 2016-08-28 DIAGNOSIS — R4789 Other speech disturbances: Secondary | ICD-10-CM

## 2016-09-03 ENCOUNTER — Telehealth: Payer: Self-pay | Admitting: *Deleted

## 2016-09-03 NOTE — Telephone Encounter (Signed)
Per Dr Epimenio FootSater, spoke with patient and informed her that her MRI of the brain is normal for her age. She stated she needed to schedule a follow up; advised her she has one on 09/29/16, 1 pm. She inquired if she should come in sooner as her dizziness is not improved. This RN found no sooner opening to reschedule her. She will monitor her symptoms.  She verbalized understanding, appreciation of call.

## 2016-09-05 ENCOUNTER — Encounter: Payer: Self-pay | Admitting: Family Medicine

## 2016-09-05 ENCOUNTER — Ambulatory Visit (INDEPENDENT_AMBULATORY_CARE_PROVIDER_SITE_OTHER): Payer: BLUE CROSS/BLUE SHIELD | Admitting: Family Medicine

## 2016-09-05 VITALS — Temp 97.8°F | Ht 64.0 in | Wt 134.2 lb

## 2016-09-05 DIAGNOSIS — J019 Acute sinusitis, unspecified: Secondary | ICD-10-CM

## 2016-09-05 DIAGNOSIS — B9789 Other viral agents as the cause of diseases classified elsewhere: Secondary | ICD-10-CM | POA: Diagnosis not present

## 2016-09-05 DIAGNOSIS — B9689 Other specified bacterial agents as the cause of diseases classified elsewhere: Secondary | ICD-10-CM

## 2016-09-05 DIAGNOSIS — J069 Acute upper respiratory infection, unspecified: Secondary | ICD-10-CM

## 2016-09-05 MED ORDER — ESCITALOPRAM OXALATE 10 MG PO TABS: 10.0000 mg | ORAL_TABLET | Freq: Every day | ORAL | 4 refills | Status: DC

## 2016-09-05 MED ORDER — BENZONATATE 100 MG PO CAPS: 100.0000 mg | ORAL_CAPSULE | Freq: Three times a day (TID) | ORAL | 0 refills | Status: DC | PRN

## 2016-09-05 MED ORDER — LEVALBUTEROL TARTRATE 45 MCG/ACT IN AERO: 2.0000 | INHALATION_SPRAY | Freq: Four times a day (QID) | RESPIRATORY_TRACT | 12 refills | Status: DC | PRN

## 2016-09-05 MED ORDER — LEVALBUTEROL HCL 0.63 MG/3ML IN NEBU: INHALATION_SOLUTION | RESPIRATORY_TRACT | 3 refills | Status: DC

## 2016-09-05 MED ORDER — ALBUTEROL SULFATE HFA 108 (90 BASE) MCG/ACT IN AERS: 2.0000 | INHALATION_SPRAY | Freq: Four times a day (QID) | RESPIRATORY_TRACT | 2 refills | Status: DC | PRN

## 2016-09-05 MED ORDER — CEFDINIR 300 MG PO CAPS: 300.0000 mg | ORAL_CAPSULE | Freq: Two times a day (BID) | ORAL | 0 refills | Status: DC

## 2016-09-05 NOTE — Progress Notes (Signed)
   Subjective:    Patient ID: Victoria [name], female    DOB: [birth year], 55 y.o.   MRN: [id number]  Cough  This is a new problem. The current episode started in the past 7 days. Associated symptoms include headaches, nasal congestion, rhinorrhea, a sore throat and wheezing. Pertinent negatives include no chest pain, ear pain, fever or shortness of breath. She has tried OTC cough suppressant (aleve) for the symptoms.  Patient also states she's having a lot of problems with PTSD symptoms as well as anxiousness related to a recent dog bite states Cymbalta not helping this is interested in trying some different she mentioned Lexapro  Patient also has history of tachycardia recently on metoprolol does not when he uses albuterol but feel she needs for her lungs would like to use Xopenex    Review of Systems  Constitutional: Negative for activity change and fever.  HENT: Positive for congestion, rhinorrhea and sore throat. Negative for ear pain.   Eyes: Negative for discharge.  Respiratory: Positive for cough and wheezing. Negative for shortness of breath.   Cardiovascular: Negative for chest pain.  Neurological: Positive for headaches.       Objective:   Physical Exam  Constitutional: She appears well-developed.  HENT:  Head: Normocephalic.  Nose: Nose normal.  Mouth/Throat: Oropharynx is clear and moist. No oropharyngeal exudate.  Neck: Neck supple.  Cardiovascular: Normal rate and normal heart sounds.   No murmur heard. Pulmonary/Chest: Effort normal and breath sounds normal. She has no wheezes.  Lymphadenopathy:    She has no cervical adenopathy.  Skin: Skin is warm and dry.  Nursing note and vitals reviewed.         Assessment & Plan:  Xopenex prescribed to help with bronchial tightness Probable sinus infection Antibiotics prescribed If ongoing troubles follow-up  Stop Cymbalta tried Lexapro 10 mg one half tablet for the first week then one tablet thereafter patient not  suicidal she will follow-up Dr. Jeannett SeniorStephen settle

## 2016-09-10 DIAGNOSIS — N951 Menopausal and female climacteric states: Secondary | ICD-10-CM | POA: Diagnosis not present

## 2016-09-10 DIAGNOSIS — N958 Other specified menopausal and perimenopausal disorders: Secondary | ICD-10-CM | POA: Diagnosis not present

## 2016-09-11 ENCOUNTER — Other Ambulatory Visit: Payer: Self-pay | Admitting: Family Medicine

## 2016-09-16 DIAGNOSIS — R102 Pelvic and perineal pain: Secondary | ICD-10-CM | POA: Diagnosis not present

## 2016-09-17 DIAGNOSIS — M5431 Sciatica, right side: Secondary | ICD-10-CM | POA: Diagnosis not present

## 2016-09-24 ENCOUNTER — Ambulatory Visit: Payer: BLUE CROSS/BLUE SHIELD | Admitting: Family Medicine

## 2016-09-27 ENCOUNTER — Other Ambulatory Visit: Payer: Self-pay | Admitting: Family Medicine

## 2016-09-27 MED ORDER — PREDNISONE 10 MG PO TABS: ORAL_TABLET | ORAL | 0 refills | Status: DC

## 2016-09-27 MED ORDER — CEFDINIR 300 MG PO CAPS: 300.0000 mg | ORAL_CAPSULE | Freq: Two times a day (BID) | ORAL | 0 refills | Status: DC

## 2016-09-27 MED ORDER — BENZONATATE 100 MG PO CAPS: ORAL_CAPSULE | ORAL | 0 refills | Status: DC

## 2016-09-27 NOTE — Progress Notes (Signed)
meds called in on a Saturday for re exacrbation of sinusitis broncitis and exac of asthma

## 2016-09-28 ENCOUNTER — Other Ambulatory Visit: Payer: Self-pay | Admitting: Family Medicine

## 2016-09-29 ENCOUNTER — Telehealth: Payer: Self-pay | Admitting: Adult Health

## 2016-09-29 NOTE — Telephone Encounter (Signed)
Pt not sure if she wants to switch just yet,states she is just gathering  Information. Will see pcp to discuss tomorrow

## 2016-09-29 NOTE — Telephone Encounter (Signed)
Ok plus two monthly ref 

## 2016-09-29 NOTE — Telephone Encounter (Signed)
Pt called stating she's had 2 URI since starting metoprolol tartrate (LOPRESSOR) 25 MG tablet [[id number]][[id number]] she's wondering if this is medication is making her asthma worse.

## 2016-09-29 NOTE — Telephone Encounter (Signed)
Can try diltiazem 30 mg bid instead.

## 2016-09-29 NOTE — Telephone Encounter (Signed)
I will forward to Dr Purvis SheffieldKoneswaran for his input.I see snap shot showing allergic rhinitis but no asthma history

## 2016-09-30 ENCOUNTER — Encounter: Payer: Self-pay | Admitting: Family Medicine

## 2016-09-30 ENCOUNTER — Ambulatory Visit (HOSPITAL_COMMUNITY)
Admission: RE | Admit: 2016-09-30 | Discharge: 2016-09-30 | Disposition: A | Discharge status: Home / Self Care | Payer: BLUE CROSS/BLUE SHIELD | Source: Ambulatory Visit | Attending: Family Medicine | Admitting: Family Medicine

## 2016-09-30 ENCOUNTER — Other Ambulatory Visit: Payer: Self-pay

## 2016-09-30 ENCOUNTER — Ambulatory Visit (INDEPENDENT_AMBULATORY_CARE_PROVIDER_SITE_OTHER): Payer: BLUE CROSS/BLUE SHIELD | Admitting: Family Medicine

## 2016-09-30 VITALS — BP 130/80 | HR 120 | Temp 97.7°F | Ht 64.0 in | Wt 137.0 lb

## 2016-09-30 DIAGNOSIS — R911 Solitary pulmonary nodule: Secondary | ICD-10-CM | POA: Insufficient documentation

## 2016-09-30 DIAGNOSIS — J45909 Unspecified asthma, uncomplicated: Secondary | ICD-10-CM | POA: Insufficient documentation

## 2016-09-30 DIAGNOSIS — R059 Cough, unspecified: Secondary | ICD-10-CM

## 2016-09-30 DIAGNOSIS — R079 Chest pain, unspecified: Secondary | ICD-10-CM | POA: Diagnosis not present

## 2016-09-30 DIAGNOSIS — R05 Cough: Secondary | ICD-10-CM | POA: Insufficient documentation

## 2016-09-30 DIAGNOSIS — R918 Other nonspecific abnormal finding of lung field: Secondary | ICD-10-CM | POA: Diagnosis not present

## 2016-09-30 DIAGNOSIS — J4521 Mild intermittent asthma with (acute) exacerbation: Secondary | ICD-10-CM

## 2016-09-30 DIAGNOSIS — R229 Localized swelling, mass and lump, unspecified: Secondary | ICD-10-CM

## 2016-09-30 MED ORDER — DILTIAZEM HCL 30 MG PO TABS: 30.0000 mg | ORAL_TABLET | Freq: Two times a day (BID) | ORAL | 3 refills | Status: DC

## 2016-09-30 NOTE — Progress Notes (Signed)
   Subjective:    Patient ID: Victoria [name], female    DOB: 07-20-1961, 56 y.o.   MRN: [id number]  Shortness of Breath  This is a new problem. The current episode started in the past 7 days. The problem occurs intermittently. The problem has been unchanged. Associated symptoms include a sore throat. Nothing aggravates the symptoms. Associated symptoms comments: Nasal congestion. The patient has no known risk factors for DVT/PE. Treatments tried: singulair, neb trreatments, prednisone. The treatment provided no relief.   Patient has another ulcer in her throat.   Had similar resp illness one mo ago, never completely got over the coughfeeling tight in the chest  Some mild productive cough, Patient very anxious about her breathing. Patient goes into great length on her symptoms of cough shortness of breath, wheeziness etc.  Review of Systems  HENT: Positive for sore throat.   Respiratory: Positive for shortness of breath.        Objective:   Physical Exam Alert vitals stable, NAD. Blood pressure good on repeat. HEENT mild nasal congestion otherwise normal. Lungs positive expiratory wheezes. Heart regular rate and rhythm.        Assessment & Plan:  Impression history of reactive airways and now impressive post respiratory illness earlier in the winter. Plan prednisone taper antibiotics prescribed. Addendum chest x-ray return no infiltrate but unfortunately a pulmonary nodule. With ongoing wheezing and shortness of breath we'll press on into a pulmonary referral WSL easily 25 minutes spent many answering questions for this nice but anxious patient, easily 25 minutes spent with patient greater than 50% the time in discussion of her multitudes of symptomatologies and potential workup and treatment WSL

## 2016-09-30 NOTE — Progress Notes (Unsigned)
[facility] per pt walked in office requesting medication be called in.

## 2016-10-01 ENCOUNTER — Ambulatory Visit (INDEPENDENT_AMBULATORY_CARE_PROVIDER_SITE_OTHER): Payer: BLUE CROSS/BLUE SHIELD | Admitting: Neurology

## 2016-10-01 ENCOUNTER — Telehealth: Payer: Self-pay | Admitting: Family Medicine

## 2016-10-01 ENCOUNTER — Encounter: Payer: Self-pay | Admitting: Neurology

## 2016-10-01 VITALS — BP 130/90 | HR 78 | Resp 16 | Ht 64.0 in | Wt 138.0 lb

## 2016-10-01 DIAGNOSIS — R42 Dizziness and giddiness: Secondary | ICD-10-CM | POA: Diagnosis not present

## 2016-10-01 DIAGNOSIS — R000 Tachycardia, unspecified: Secondary | ICD-10-CM

## 2016-10-01 DIAGNOSIS — F411 Generalized anxiety disorder: Secondary | ICD-10-CM | POA: Insufficient documentation

## 2016-10-01 DIAGNOSIS — R002 Palpitations: Secondary | ICD-10-CM
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
## 2016-10-01 NOTE — Telephone Encounter (Signed)
Pt states prednisone was called in on Saturday.  4 for 3 days 3 for 3 days 2 for 2 days She only got #23 tabs instead of #25.  She is still having trouble breathing. She will start the 3 for 3 days today and she is ok with waiting for response til tomorrow when you come back in office. Does she need to take the extra two tablets.

## 2016-10-01 NOTE — Telephone Encounter (Signed)
Patient said that she is 2 pills short on her prednisone that was called in yesterday by Dr. Brett CanalesSteve.  She said that the directions did not match with the quantity that was called in and the pharmacy can't fix it now that it is filled.  She wants to know if her being 2 pills short is a big deal and if so, can we call in?  [facility]

## 2016-10-02 ENCOUNTER — Encounter: Payer: Self-pay | Admitting: Family Medicine

## 2016-10-02 ENCOUNTER — Telehealth: Payer: Self-pay | Admitting: Cardiovascular Disease

## 2016-10-02 DIAGNOSIS — M5431 Sciatica, right side: Secondary | ICD-10-CM | POA: Diagnosis not present

## 2016-10-02 MED ORDER — DILTIAZEM HCL 60 MG PO TABS: 60.0000 mg | ORAL_TABLET | Freq: Two times a day (BID) | ORAL | 3 refills | Status: DC

## 2016-10-02 NOTE — Telephone Encounter (Signed)
Diltiazem should not be a problem for the diagnoses being considered and treated.

## 2016-10-02 NOTE — Telephone Encounter (Signed)
Increase to 60 mg bid.

## 2016-10-02 NOTE — Telephone Encounter (Signed)
Left message to return call 

## 2016-10-02 NOTE — Telephone Encounter (Signed)
Patient called stating that she is very short of breath. Had Chest xray 10/01/16. She is concerned about taking Diltiazem . Please call [phone].

## 2016-10-02 NOTE — Telephone Encounter (Signed)
Call in twenty more, then give her these instructions and tell her not to worry if a few tabs left over, go back up to four per day for three more days, then three daily for three more days , then two daily for two days

## 2016-10-02 NOTE — Telephone Encounter (Signed)
Pt aware will update on symptoms next week - updated medication list - new rx sent to pharmacy

## 2016-10-02 NOTE — Telephone Encounter (Signed)
See most recent OV from PMD & neurology.  Please advise.

## 2016-10-02 NOTE — Telephone Encounter (Signed)
Pt says she doesn't think diltiazem is working HR staying in 100-120s most of the time - BP 130/90 - pt says she is uncomfortable feels like heart is racing all of the time- and having SOB

## 2016-10-03 ENCOUNTER — Telehealth: Payer: Self-pay | Admitting: *Deleted

## 2016-10-03 ENCOUNTER — Ambulatory Visit (INDEPENDENT_AMBULATORY_CARE_PROVIDER_SITE_OTHER): Payer: BLUE CROSS/BLUE SHIELD | Admitting: Emergency Medicine

## 2016-10-03 ENCOUNTER — Encounter: Payer: Self-pay | Admitting: Emergency Medicine

## 2016-10-03 ENCOUNTER — Telehealth: Payer: Self-pay | Admitting: Family Medicine

## 2016-10-03 ENCOUNTER — Other Ambulatory Visit: Payer: Self-pay | Admitting: *Deleted

## 2016-10-03 VITALS — BP 141/92 | HR 96 | Ht 64.0 in | Wt 137.6 lb

## 2016-10-03 DIAGNOSIS — R059 Cough, unspecified: Secondary | ICD-10-CM

## 2016-10-03 DIAGNOSIS — J301 Allergic rhinitis due to pollen: Secondary | ICD-10-CM

## 2016-10-03 DIAGNOSIS — R911 Solitary pulmonary nodule: Secondary | ICD-10-CM | POA: Diagnosis not present

## 2016-10-03 DIAGNOSIS — K219 Gastro-esophageal reflux disease without esophagitis: Secondary | ICD-10-CM

## 2016-10-03 DIAGNOSIS — R05 Cough: Secondary | ICD-10-CM | POA: Diagnosis not present

## 2016-10-03 DIAGNOSIS — J45909 Unspecified asthma, uncomplicated: Secondary | ICD-10-CM | POA: Diagnosis not present

## 2016-10-03 MED ORDER — HYDROCODONE-HOMATROPINE 5-1.5 MG/5ML PO SYRP: 5.0000 mL | ORAL_SOLUTION | Freq: Four times a day (QID) | ORAL | 0 refills | Status: DC | PRN

## 2016-10-03 MED ORDER — OMEPRAZOLE 20 MG PO CPDR: 20.0000 mg | DELAYED_RELEASE_CAPSULE | Freq: Two times a day (BID) | ORAL | 5 refills | Status: DC

## 2016-10-03 MED ORDER — BENZONATATE 100 MG PO CAPS: ORAL_CAPSULE | ORAL | 0 refills | Status: DC

## 2016-10-03 MED ORDER — PREDNISONE 20 MG PO TABS: 20.0000 mg | ORAL_TABLET | Freq: Every day | ORAL | 0 refills | Status: DC

## 2016-10-03 NOTE — Patient Instructions (Addendum)
Complete the prednisone taper that was just ordered for you Continue singulair Start allegra 180mg  daily Start fluticasone 2 sprays each nostril once a day Increase your omeprazole to 20mg  twice a day until you next visit.  Use hycodan 5cc up to every 6 hours as needed to suppress your cough Use tessalon perles up to every 8 hours as needed to suppress your cough.  Try to practice voice rest and to avoid throat clearing We will perform a CT scan of the chest to characterize suspected pulmonary nodule. We will perform full pulmonary function testing Follow with Dr Delton CoombesByrum in 1 month to assess your progress and to review your testing.

## 2016-10-03 NOTE — Telephone Encounter (Signed)
Pt states 10mg  tabs were called in on Saturday by dr Brett Canalessteve and 20mg  today. Message said to send in 20 more tabs and the protocol for adult taper was 20mg . I sent in 20mg . Pt states in the past 20mg  has been too strong. Pt advised to cut tablets in half.

## 2016-10-03 NOTE — Telephone Encounter (Signed)
Patient had Rx for prednisone called in but she believes it is too strong and would like a nurse to call her.  She would like a call before 2:30 today because she has an appointment elsewhere.

## 2016-10-03 NOTE — Progress Notes (Signed)
Subjective:    Patient ID: Victoria [name], female    DOB: 1961/02/09, 56 y.o.   MRN: [id number]  HPI 56 year old never smoker hx of hypertension, GERD, asthma that was dx in childhood but no adult sx.  She reports that she has been seen for dyspnea and in the setting URI symptoms (drainage, cough) that led to some chest tightness. Treated with abx, tessalon, xopenex prn. Improved some but kept a dry cough. Then last week more drainage, more cough, prompted prednisone + abx + nebulized BD. In setting of these resp findings her metoprolol was stopped, changed to diltiazem. She is currently on pred taper, 20mg  today. She has been on singulair for 6 days, started by Dr [place] Callas, Allergy. She feels some chest tightness - didn't seem to get better with BD. She does have some stable nasal congestion. She has GERD, feels that it is well controlled on omeprazole. She does experience some reflux.    Review of Systems  Constitutional: Negative.  Negative for fever and unexpected weight change.  HENT: Positive for congestion. Negative for dental problem, ear pain, nosebleeds, postnasal drip, rhinorrhea, sinus pressure, sneezing, sore throat and trouble swallowing.   Eyes: Negative.  Negative for redness and itching.  Respiratory: Positive for cough, chest tightness and shortness of breath.   Cardiovascular: Positive for palpitations. Negative for leg swelling.  Gastrointestinal: Negative.  Negative for nausea and vomiting.  Genitourinary: Negative.  Negative for dysuria.  Musculoskeletal: Negative.  Negative for joint swelling.  Skin: Negative for rash.  Neurological: Negative.  Negative for headaches.  Hematological: Negative.  Does not bruise/bleed easily.  Psychiatric/Behavioral: Negative.  Negative for dysphoric mood. The patient is not nervous/anxious.      Past Medical History:  Diagnosis Date  . Asthma   . GERD (gastroesophageal reflux disease)   . GERD (gastroesophageal reflux disease)   .  Headache   . Hypertension   . Scoliosis   . Vertigo      Family History  Problem Relation Age of Onset  . Heart attack Father   . Stroke Father      Social History   Social History  . Marital status: Married    Spouse name: N/A  . Number of children: N/A  . Years of education: N/A   Occupational History  . Not on file.   Social History Main Topics  . Smoking status: Never Smoker  . Smokeless tobacco: Never Used  . Alcohol use No  . Drug use: No  . Sexual activity: Yes    Partners: Male   Other Topics Concern  . Not on file   Social History Narrative  . No narrative on file     No Known Allergies   Outpatient Medications Prior to Visit  Medication Sig Dispense Refill  . ALPRAZolam (XANAX) 0.5 MG tablet TAKE 1/2 TO 1 TABLET BY MOUTH TWICE DAILY AS NEEDED 60 tablet 2  . ANALPRAM-HC 1-1 % rectal cream APPLY TO THE AFFECTED BID  12  . Calcium-Magnesium-Vitamin D (CALCIUM 500 PO) Take by mouth.    . cefdinir (OMNICEF) 300 MG capsule Take 1 capsule (300 mg total) by mouth 2 (two) times daily. 20 capsule 0  . dicyclomine (BENTYL) 10 MG capsule TAKE 1 CAPSULE(10 MG) BY MOUTH THREE TIMES DAILY 90 capsule 3  . diltiazem (CARDIZEM) 60 MG tablet Take 1 tablet (60 mg total) by mouth 2 (two) times daily. 60 tablet 3  . levalbuterol (XOPENEX) 0.63 MG/3ML nebulizer solution Use q 6  hours via neb prn 75 mL 3  . LO LOESTRIN FE 1 MG-10 MCG / 10 MCG tablet TAKE 1 TABLET BY MOUTH EVERY DAY 28 tablet 0  . Magnesium Gluconate (MAGNESIUM 27 PO) Take by mouth.    . metroNIDAZOLE (METROGEL) 1 % gel APPLY AA ON FACE QHS  8  . montelukast (SINGULAIR) 10 MG tablet Take 10 mg by mouth at bedtime.   2  . Multiple Vitamin (MULTIVITAMIN) tablet Take 1 tablet by mouth daily.    . polyethylene glycol (MIRALAX / GLYCOLAX) packet Take 17 g by mouth daily.    . predniSONE (DELTASONE) 20 MG tablet Take 1 tablet (20 mg total) by mouth daily. 23 tablet 0  . Probiotic Product (RESTORA PO) Take by mouth.     . psyllium (METAMUCIL) 58.6 % powder Take 1 packet by mouth daily.     . RESTASIS 0.05 % ophthalmic emulsion INSTILL 1 DROP IN EACH EYE Q 12 H  3  . benzonatate (TESSALON) 100 MG capsule TAKE 1 CAPSULE(100 MG) BY MOUTH THREE TIMES DAILY AS NEEDED FOR COUGH 30 capsule 0  . omeprazole (PRILOSEC) 20 MG capsule Take 1 capsule (20 mg total) by mouth daily. 90 capsule 3  . cyclobenzaprine (FLEXERIL) 10 MG tablet Take 1 tablet (10 mg total) by mouth 3 (three) times daily as needed. for muscle spams (Patient not taking: Reported on 10/03/2016) 40 tablet 3  . escitalopram (LEXAPRO) 10 MG tablet Take 1 tablet (10 mg total) by mouth daily. (Patient not taking: Reported on 10/03/2016) 30 tablet 4  . HYDROcodone-acetaminophen (NORCO/VICODIN) 5-325 MG tablet Take one every 4 - 6 hours prn pain (Patient not taking: Reported on 10/03/2016) 36 tablet 0  . hydrocortisone cream 0.5 % APPLY RECTALLY TWICE DAILY AS DIRECTED FOR 7 DAYS (Patient not taking: Reported on 10/03/2016) 56 g 5  . levalbuterol (XOPENEX HFA) 45 MCG/ACT inhaler Inhale 2 puffs into the lungs every 6 (six) hours as needed for wheezing. (Patient not taking: Reported on 10/03/2016) 1 Inhaler 12  . mupirocin ointment (BACTROBAN) 2 % APP TOPICALLY AA TID FOR 7-10 DAYS (Patient not taking: Reported on 10/03/2016) 22 g 3   No facility-administered medications prior to visit.         Objective:   Physical Exam Vitals:   10/03/16 1439  BP: (!) 141/92  Pulse: 96  SpO2: 97%  Weight: 137 lb 9.6 oz (62.4 kg)  Height: 5\' 4"  (1.626 m)   Gen: Pleasant, well-nourished, in no distress,  normal affect  ENT: No lesions,  mouth clear,  oropharynx clear, no postnasal drip  Neck: No JVD, no TMG, no carotid bruits  Lungs: No use of accessory muscles, no dullness to percussion, clear without rales or rhonchi  Cardiovascular: RRR, heart sounds normal, no murmur or gallops, no peripheral edema  Musculoskeletal: No deformities, no cyanosis or  clubbing  Neuro: alert, non focal  Skin: Warm, no lesions or rashes      Assessment & Plan:  Cough Multiple complaints that include some chest tightness, inability to move air, raw feeling in her chest, that all seem to revolve around continued dry cough. I suspect that this is upper airway in nature. She does have breakthrough rhinitis and GERD symptoms that are probable drivers. The original trigger appeared to be an upper respiratory infection. Must also consider lower airways disease as she does have a history of childhood asthma. She has been exposed to more mold at her father's house, has been exposed to metoprolol  to control tachycardia. Either of these could bring out asthma sx. Her failure to respond to BD's argues against this. I believe we should concentrate on treating UA irritants, check PFT to assess for coexisting asthma. Unclear that she needs to continue BD's - I have asked her to minimize.   Complete the prednisone taper that was just ordered for you Continue singulair Start allegra 180mg  daily Start fluticasone 2 sprays each nostril once a day Increase your omeprazole to 20mg  twice a day until you next visit.  Use hycodan 5cc up to every 6 hours as needed to suppress your cough Use tessalon perles up to every 8 hours as needed to suppress your cough.  Try to practice voice rest and to avoid throat clearing We will perform full pulmonary function testing Follow with Dr Delton Coombes in 1 month to assess your progress and to review your testing.    Asthma Unclear whether she has AFL - will check PFT to assess.   Allergic rhinitis Start allegra, fluticasone. Continue singulair  Esophageal reflux Temporarily increase omeprazole to bid; scale back depending on her sx next visit  Solitary pulmonary nodule Calcified 7mm L peripheral nodule on CXR. Discussed it's nature with her, explained that it is likely benign. We will obtain CT chest to better characterize.   Levy Pupa,  MD, PhD 10/03/2016, 4:03 PM [facility] [phone] or if no answer [phone]

## 2016-10-03 NOTE — Assessment & Plan Note (Signed)
Calcified 7mm L peripheral nodule on CXR. Discussed it's nature with her, explained that it is likely benign. We will obtain CT chest to better characterize.

## 2016-10-03 NOTE — Assessment & Plan Note (Signed)
Unclear whether she has AFL - will check PFT to assess.

## 2016-10-03 NOTE — Assessment & Plan Note (Signed)
Temporarily increase omeprazole to bid; scale back depending on her sx next visit

## 2016-10-03 NOTE — Telephone Encounter (Signed)
Discussed with pt. Pt only has 2 pills left and has not taken today. She needs 25 total to do the directions below in message. Sent in 23 tabs per dr Brett Canalessteve. Directions sent to pharm to take one daily but pt wrote down directions and understands how to take.

## 2016-10-03 NOTE — Assessment & Plan Note (Signed)
Start allegra, fluticasone. Continue singulair

## 2016-10-03 NOTE — Assessment & Plan Note (Addendum)
Multiple complaints that include some chest tightness, inability to move air, raw feeling in her chest, that all seem to revolve around continued dry cough. I suspect that this is upper airway in nature. She does have breakthrough rhinitis and GERD symptoms that are probable drivers. The original trigger appeared to be an upper respiratory infection. Must also consider lower airways disease as she does have a history of childhood asthma. She has been exposed to more mold at her father's house, has been exposed to metoprolol to control tachycardia. Either of these could bring out asthma sx. Her failure to respond to BD's argues against this. I believe we should concentrate on treating UA irritants, check PFT to assess for coexisting asthma. Unclear that she needs to continue BD's - I have asked her to minimize.   Complete the prednisone taper that was just ordered for you Continue singulair Start allegra 180mg  daily Start fluticasone 2 sprays each nostril once a day Increase your omeprazole to 20mg  twice a day until you next visit.  Use hycodan 5cc up to every 6 hours as needed to suppress your cough Use tessalon perles up to every 8 hours as needed to suppress your cough.  Try to practice voice rest and to avoid throat clearing We will perform full pulmonary function testing Follow with Dr Delton CoombesByrum in 1 month to assess your progress and to review your testing.

## 2016-10-06 DIAGNOSIS — M5431 Sciatica, right side: Secondary | ICD-10-CM | POA: Diagnosis not present

## 2016-10-09 ENCOUNTER — Ambulatory Visit (INDEPENDENT_AMBULATORY_CARE_PROVIDER_SITE_OTHER)
Admission: RE | Admit: 2016-10-09 | Discharge: 2016-10-09 | Disposition: A | Discharge status: Home / Self Care | Payer: BLUE CROSS/BLUE SHIELD | Source: Ambulatory Visit | Attending: Emergency Medicine | Admitting: Emergency Medicine

## 2016-10-09 DIAGNOSIS — M5431 Sciatica, right side: Secondary | ICD-10-CM | POA: Diagnosis not present

## 2016-10-09 DIAGNOSIS — R911 Solitary pulmonary nodule: Secondary | ICD-10-CM | POA: Diagnosis not present

## 2016-10-10 ENCOUNTER — Inpatient Hospital Stay: Admission: RE | Admit: 2016-10-10 | Payer: BLUE CROSS/BLUE SHIELD | Source: Ambulatory Visit

## 2016-10-13 ENCOUNTER — Telehealth: Payer: Self-pay | Admitting: Emergency Medicine

## 2016-10-13 DIAGNOSIS — M5431 Sciatica, right side: Secondary | ICD-10-CM | POA: Diagnosis not present

## 2016-10-13 NOTE — Telephone Encounter (Signed)
lmtcb x1 for pt. 

## 2016-10-14 NOTE — Telephone Encounter (Signed)
Spoke with pt and advised that we will have Dr Delton CoombesByrum result on the Chest CT and we will call her back with results.  Pt verbalized understanding. Dr Delton CoombesByrum please advise

## 2016-10-14 NOTE — Telephone Encounter (Signed)
Please let her know that the CT scan shows that there is no lung nodule present. The shadow seen on her CXR was a "fake out". This is good news.

## 2016-10-14 NOTE — Telephone Encounter (Signed)
Pt aware of results and voiced her understanding. Nothing further needed.  

## 2016-10-16 DIAGNOSIS — M5431 Sciatica, right side: Secondary | ICD-10-CM | POA: Diagnosis not present

## 2016-10-20 DIAGNOSIS — M5431 Sciatica, right side: Secondary | ICD-10-CM | POA: Diagnosis not present

## 2016-10-21 ENCOUNTER — Telehealth: Payer: Self-pay | Admitting: Emergency Medicine

## 2016-10-21 ENCOUNTER — Ambulatory Visit (INDEPENDENT_AMBULATORY_CARE_PROVIDER_SITE_OTHER): Payer: BLUE CROSS/BLUE SHIELD | Admitting: Internal Medicine

## 2016-10-21 ENCOUNTER — Encounter (INDEPENDENT_AMBULATORY_CARE_PROVIDER_SITE_OTHER): Payer: Self-pay | Admitting: Internal Medicine

## 2016-10-21 VITALS — BP 162/94 | HR 60 | Temp 97.7°F | Ht 64.0 in | Wt 138.2 lb

## 2016-10-21 DIAGNOSIS — K625 Hemorrhage of anus and rectum: Secondary | ICD-10-CM

## 2016-10-21 DIAGNOSIS — K219 Gastro-esophageal reflux disease without esophagitis: Secondary | ICD-10-CM

## 2016-10-21 LAB — CBC WITH DIFFERENTIAL/PLATELET
BASOS ABS: 0 {cells}/uL (ref 0–200)
Basophils Relative: 0 %
Eosinophils Absolute: 100 cells/uL (ref 15–500)
Eosinophils Relative: 2 %
HEMATOCRIT: 42.6 % (ref 35.0–45.0)
HEMOGLOBIN: 14.3 g/dL (ref 11.7–15.5)
LYMPHS ABS: 1300 {cells}/uL (ref 850–3900)
Lymphocytes Relative: 26 %
MCH: 34 pg — AB (ref 27.0–33.0)
MCHC: 33.6 g/dL (ref 32.0–36.0)
MCV: 101.4 fL — AB (ref 80.0–100.0)
MONO ABS: 650 {cells}/uL (ref 200–950)
MPV: 9.4 fL (ref 7.5–12.5)
Monocytes Relative: 13 %
NEUTROS PCT: 59 %
Neutro Abs: 2950 cells/uL (ref 1500–7800)
Platelets: 203 10*3/uL (ref 140–400)
RBC: 4.2 MIL/uL (ref 3.80–5.10)
RDW: 13 % (ref 11.0–15.0)
WBC: 5 10*3/uL (ref 3.8–10.8)

## 2016-10-21 MED ORDER — METRONIDAZOLE 250 MG PO TABS: 250.0000 mg | ORAL_TABLET | Freq: Three times a day (TID) | ORAL | 0 refills | Status: DC

## 2016-10-21 MED ORDER — OMEPRAZOLE 20 MG PO CPDR: 20.0000 mg | DELAYED_RELEASE_CAPSULE | Freq: Every day | ORAL | 3 refills | Status: DC

## 2016-10-21 NOTE — Telephone Encounter (Signed)
CT results were discussed with pt on 10-14-16. Pt states she received a call from [place] today. I don't see anything documented in pt's chart in regards to a call from today.  Lillia AbedLindsay please advise. Thanks.

## 2016-10-21 NOTE — Progress Notes (Signed)
Subjective:    Patient ID: Victoria [name], female    DOB: 09-18-1960, 56 y.o.   MRN: [id number]  HPIHere today for f/u. She was last seen in February of 2017. Hx of GERD and takes Prilosec.  Hx of IBS an maintained on Dicyclomine.  Treated for ? Colitis in November with Flagyl.250mg  TID x 7 days.  She says 1-2 weeks ago. She says she was constipated. She says she is having BM with blood for a couple of weeks. If she strains to have a BM or if she doesn't, she see blood. She feels better today.  She is having for the past 2 weeks 5-8 stools a day She says normally she has 8 stools a day.  Stools have been formed.  There is no abdominal pain associated with her symptoms.  Today she says feels fine. Her rectum is a little "unhappy".      04/25/2004: Esophagogastroduodenoscopy. NDICATIONS: Emagene is a 56 year old Caucasian female who has chronic heartburn which has responded well to PPI. She also had an episode of melena recently. Therefore, EGD was advised. FINAL DIAGNOSES: Noncritical distal esophageal ring which was not manipulated as patient does not have dysphagia. The rest of the esophagogastroduodenoscopy was normal.   06/17/2009 Colonoscopy. : Reason for exam: colitis: Normal colonoscopy and terminal ileoscopy.    Review of Systems Past Medical History:  Diagnosis Date  . Asthma   . GERD (gastroesophageal reflux disease)   . GERD (gastroesophageal reflux disease)   . Headache   . Hypertension   . Scoliosis   . Vertigo     Past Surgical History:  Procedure Laterality Date  . COLONOSCOPY      No Known Allergies  Current Outpatient Prescriptions on File Prior to Visit  Medication Sig Dispense Refill  . ALPRAZolam (XANAX) 0.5 MG tablet TAKE 1/2 TO 1 TABLET BY MOUTH TWICE DAILY AS NEEDED 60 tablet 2  . ANALPRAM-HC 1-1 % rectal cream APPLY TO THE AFFECTED BID  12  . benzonatate (TESSALON) 100 MG capsule TAKE 1 CAPSULE(100 MG) BY MOUTH THREE TIMES DAILY AS  NEEDED FOR COUGH 90 capsule 0  . Calcium-Magnesium-Vitamin D (CALCIUM 500 PO) Take by mouth.    . cefdinir (OMNICEF) 300 MG capsule Take 1 capsule (300 mg total) by mouth 2 (two) times daily. 20 capsule 0  . cyclobenzaprine (FLEXERIL) 10 MG tablet Take 1 tablet (10 mg total) by mouth 3 (three) times daily as needed. for muscle spams (Patient not taking: Reported on 10/03/2016) 40 tablet 3  . dicyclomine (BENTYL) 10 MG capsule TAKE 1 CAPSULE(10 MG) BY MOUTH THREE TIMES DAILY 90 capsule 3  . diltiazem (CARDIZEM) 60 MG tablet Take 1 tablet (60 mg total) by mouth 2 (two) times daily. 60 tablet 3  . escitalopram (LEXAPRO) 10 MG tablet Take 1 tablet (10 mg total) by mouth daily. (Patient not taking: Reported on 10/03/2016) 30 tablet 4  . HYDROcodone-acetaminophen (NORCO/VICODIN) 5-325 MG tablet Take one every 4 - 6 hours prn pain (Patient not taking: Reported on 10/03/2016) 36 tablet 0  . HYDROcodone-homatropine (HYCODAN) 5-1.5 MG/5ML syrup Take 5 mLs by mouth every 6 (six) hours as needed for cough. 140 mL 0  . hydrocortisone cream 0.5 % APPLY RECTALLY TWICE DAILY AS DIRECTED FOR 7 DAYS (Patient not taking: Reported on 10/03/2016) 56 g 5  . levalbuterol (XOPENEX HFA) 45 MCG/ACT inhaler Inhale 2 puffs into the lungs every 6 (six) hours as needed for wheezing. (Patient not taking: Reported on 10/03/2016) 1  Inhaler 12  . levalbuterol (XOPENEX) 0.63 MG/3ML nebulizer solution Use q 6 hours via neb prn 75 mL 3  . LO LOESTRIN FE 1 MG-10 MCG / 10 MCG tablet TAKE 1 TABLET BY MOUTH EVERY DAY 28 tablet 0  . Magnesium Gluconate (MAGNESIUM 27 PO) Take by mouth.    . metroNIDAZOLE (METROGEL) 1 % gel APPLY AA ON FACE QHS  8  . montelukast (SINGULAIR) 10 MG tablet Take 10 mg by mouth at bedtime.   2  . Multiple Vitamin (MULTIVITAMIN) tablet Take 1 tablet by mouth daily.    . mupirocin ointment (BACTROBAN) 2 % APP TOPICALLY AA TID FOR 7-10 DAYS (Patient not taking: Reported on 10/03/2016) 22 g 3  . omeprazole (PRILOSEC) 20 MG  capsule Take 1 capsule (20 mg total) by mouth 2 (two) times daily before a meal. 60 capsule 5  . polyethylene glycol (MIRALAX / GLYCOLAX) packet Take 17 g by mouth daily.    . predniSONE (DELTASONE) 20 MG tablet Take 1 tablet (20 mg total) by mouth daily. 23 tablet 0  . Probiotic Product (RESTORA PO) Take by mouth.    . psyllium (METAMUCIL) 58.6 % powder Take 1 packet by mouth daily.     . RESTASIS 0.05 % ophthalmic emulsion INSTILL 1 DROP IN EACH EYE Q 12 H  3   No current facility-administered medications on file prior to visit.        Objective:   Physical Exam Blood pressure (!) 162/94, pulse 60, temperature 97.7 F (36.5 C), height 5\' 4"  (1.626 m), weight 138 lb 3.2 oz (62.7 kg). Alert and oriented. Skin warm and dry. Oral mucosa is moist.   . Sclera anicteric, conjunctivae is pink. Thyroid not enlarged. No cervical lymphadenopathy. Lungs clear. Heart regular rate and rhythm.  Abdomen is soft. Bowel sounds are positive. No hepatomegaly. No abdominal masses felt. No tenderness.  No edema to lower extremities. Patient is alert and oriented.       Assessment & Plan:  GERD.  IBS continue the Dicyclomine.  Rx for Flagyl 250mg  TID x 10 days sent to her pharmacy

## 2016-10-21 NOTE — Patient Instructions (Signed)
Rx for Flagyl sent to her pharmacy 

## 2016-10-23 DIAGNOSIS — M5431 Sciatica, right side: Secondary | ICD-10-CM | POA: Diagnosis not present

## 2016-10-27 DIAGNOSIS — M5431 Sciatica, right side: Secondary | ICD-10-CM | POA: Diagnosis not present

## 2016-10-29 NOTE — Telephone Encounter (Signed)
I did not call this pt. I am unaware of what she is referring to.

## 2016-10-30 ENCOUNTER — Other Ambulatory Visit: Payer: Self-pay | Admitting: Family Medicine

## 2016-10-30 DIAGNOSIS — M5431 Sciatica, right side: Secondary | ICD-10-CM | POA: Diagnosis not present

## 2016-10-31 ENCOUNTER — Ambulatory Visit (INDEPENDENT_AMBULATORY_CARE_PROVIDER_SITE_OTHER): Payer: BLUE CROSS/BLUE SHIELD | Admitting: Internal Medicine

## 2016-11-04 DIAGNOSIS — M5431 Sciatica, right side: Secondary | ICD-10-CM | POA: Diagnosis not present

## 2016-11-06 ENCOUNTER — Telehealth: Payer: Self-pay | Admitting: Cardiovascular Disease

## 2016-11-06 MED ORDER — DILTIAZEM HCL 30 MG PO TABS: 30.0000 mg | ORAL_TABLET | Freq: Three times a day (TID) | ORAL | 3 refills | Status: DC

## 2016-11-06 NOTE — Telephone Encounter (Signed)
Pt says thinks since increasing cardizem 60 mg bid is causing HR and BP to low in the 50s when she takes the AM dose and as day progresses BP rises 160s/90s and HR goes up to 110-120s - wants to try taking 30 mg more frequently - says she has been weak and tired. Routed to Dr. Purvis SheffieldKoneswaran

## 2016-11-06 NOTE — Addendum Note (Signed)
Addended by: Burman NievesASHWORTH, Neale Marzette T on: 11/06/2016 01:52 PM   Modules accepted: Orders

## 2016-11-06 NOTE — Telephone Encounter (Signed)
Please clarify 30 mg diltiazem tid?

## 2016-11-06 NOTE — Telephone Encounter (Signed)
That would be fine 

## 2016-11-06 NOTE — Telephone Encounter (Signed)
Yes

## 2016-11-06 NOTE — Telephone Encounter (Signed)
Mrs. Victoria [name] called stating that she thinks the Diltiazem 60 mg is too strong for her. She is wanting to know if 30mg  can be called in . [phone]. [facility] [place], [place]    Patient is requesting a 90 day supply.

## 2016-11-06 NOTE — Telephone Encounter (Signed)
Pt aware - [facility] [place] Medication sent to pharmacy.

## 2016-11-07 ENCOUNTER — Encounter: Payer: Self-pay | Admitting: Emergency Medicine

## 2016-11-07 ENCOUNTER — Ambulatory Visit (INDEPENDENT_AMBULATORY_CARE_PROVIDER_SITE_OTHER): Payer: BLUE CROSS/BLUE SHIELD | Admitting: Emergency Medicine

## 2016-11-07 DIAGNOSIS — R05 Cough: Secondary | ICD-10-CM | POA: Diagnosis not present

## 2016-11-07 DIAGNOSIS — R059 Cough, unspecified: Secondary | ICD-10-CM

## 2016-11-07 DIAGNOSIS — J301 Allergic rhinitis due to pollen: Secondary | ICD-10-CM | POA: Diagnosis not present

## 2016-11-07 DIAGNOSIS — R911 Solitary pulmonary nodule: Secondary | ICD-10-CM | POA: Diagnosis not present

## 2016-11-07 DIAGNOSIS — J45909 Unspecified asthma, uncomplicated: Secondary | ICD-10-CM

## 2016-11-07 LAB — PULMONARY FUNCTION TEST
DL/VA % pred: 120 %
DL/VA: 5.79 ml/min/mmHg/L
DLCO COR % PRED: 101 %
DLCO COR: 24.72 ml/min/mmHg
DLCO unc % pred: 109 %
DLCO unc: 26.67 ml/min/mmHg
FEF 25-75 POST: 3.72 L/s
FEF 25-75 Pre: 3.42 L/sec
FEF2575-%CHANGE-POST: 8 %
FEF2575-%PRED-PRE: 130 %
FEF2575-%Pred-Post: 141 %
FEV1-%Change-Post: 2 %
FEV1-%Pred-Post: 95 %
FEV1-%Pred-Pre: 93 %
FEV1-POST: 2.6 L
FEV1-Pre: 2.54 L
FEV1FVC-%CHANGE-POST: 3 %
FEV1FVC-%PRED-PRE: 109 %
FEV6-%Change-Post: 0 %
FEV6-%Pred-Post: 86 %
FEV6-%Pred-Pre: 87 %
FEV6-Post: 2.9 L
FEV6-Pre: 2.93 L
FEV6FVC-%Change-Post: 0 %
FEV6FVC-%Pred-Post: 103 %
FEV6FVC-%Pred-Pre: 102 %
FVC-%Change-Post: -1 %
FVC-%PRED-POST: 83 %
FVC-%PRED-PRE: 84 %
FVC-POST: 2.9 L
FVC-PRE: 2.93 L
POST FEV1/FVC RATIO: 89 %
PRE FEV1/FVC RATIO: 86 %
Post FEV6/FVC ratio: 100 %
Pre FEV6/FVC Ratio: 100 %
RV % pred: 103 %
RV: 1.93 L
TLC % PRED: 93 %
TLC: 4.71 L

## 2016-11-07 NOTE — Progress Notes (Signed)
Subjective:    Patient ID: Victoria [name], female    DOB: [birth year], 56 y.o.   MRN: [id number]  HPI 56 year old never smoker hx of hypertension, GERD, asthma that was dx in childhood but no adult sx.  She reports that she has been seen for dyspnea and in the setting URI symptoms (drainage, cough) that led to some chest tightness. Treated with abx, tessalon, xopenex prn. Improved some but kept a dry cough. Then last week more drainage, more cough, prompted prednisone + abx + nebulized BD. In setting of these resp findings her metoprolol was stopped, changed to diltiazem. She is currently on pred taper, 20mg  today. She has been on singulair for 6 days, started by Dr [place] Callas, Allergy. She feels some chest tightness - didn't seem to get better with BD. She does have some stable nasal congestion. She has GERD, feels that it is well controlled on omeprazole. She does experience some reflux.   ROV 11/07/16 -- Patient follows up today for chronic cough and dyspnea, suspected history of asthma. She also has allergic rhinitis and esophageal reflux which could be influencing both. Finally she also had a chest x-ray with concerns for possible calcified left lung nodule. We performed a CT scan of the chest on 10/09/16 that I have personally reviewed. This showed no evidence for a nodule and no other abnormalities. She underwent pulmonary function testing today that I have reviewed. This shows normal airflows, normal lung volumes, normal diffusion capacity. At her last visit we continued Singulair, started Allegra, started fluticasone nasal spray. We also increased her omeprazole to twice a day. She feels better, cough is better. She is still having some breakthrough GERD sx even on omeprazole bid.    Review of Systems  Constitutional: Negative.  Negative for fever and unexpected weight change.  HENT: Positive for congestion. Negative for dental problem, ear pain, nosebleeds, postnasal drip, rhinorrhea, sinus pressure,  sneezing, sore throat and trouble swallowing.   Eyes: Negative.  Negative for redness and itching.  Respiratory: Positive for cough, chest tightness and shortness of breath.   Cardiovascular: Positive for palpitations. Negative for leg swelling.  Gastrointestinal: Negative.  Negative for nausea and vomiting.  Genitourinary: Negative.  Negative for dysuria.  Musculoskeletal: Negative.  Negative for joint swelling.  Skin: Negative for rash.  Neurological: Negative.  Negative for headaches.  Hematological: Negative.  Does not bruise/bleed easily.  Psychiatric/Behavioral: Negative.  Negative for dysphoric mood. The patient is not nervous/anxious.      Past Medical History:  Diagnosis Date  . Asthma   . GERD (gastroesophageal reflux disease)   . GERD (gastroesophageal reflux disease)   . Headache   . Hypertension   . Scoliosis   . Vertigo      Family History  Problem Relation Age of Onset  . Heart attack Father   . Stroke Father      Social History   Social History  . Marital status: Married    Spouse name: N/A  . Number of children: N/A  . Years of education: N/A   Occupational History  . Not on file.   Social History Main Topics  . Smoking status: Never Smoker  . Smokeless tobacco: Never Used  . Alcohol use No  . Drug use: No  . Sexual activity: Yes    Partners: Male   Other Topics Concern  . Not on file   Social History Narrative  . No narrative on file     No Known Allergies  Outpatient Medications Prior to Visit  Medication Sig Dispense Refill  . ALPRAZolam (XANAX) 0.5 MG tablet TAKE 1/2 TO 1 TABLET BY MOUTH TWICE DAILY AS NEEDED 60 tablet 2  . ANALPRAM-HC 1-1 % rectal cream APPLY TO THE AFFECTED BID  12  . dicyclomine (BENTYL) 10 MG capsule TAKE 1 CAPSULE(10 MG) BY MOUTH THREE TIMES DAILY 90 capsule 3  . diltiazem (CARDIZEM) 30 MG tablet Take 1 tablet (30 mg total) by mouth 3 (three) times daily. 270 tablet 3  . HYDROcodone-acetaminophen  (NORCO/VICODIN) 5-325 MG tablet Take one every 4 - 6 hours prn pain 36 tablet 0  . LO LOESTRIN FE 1 MG-10 MCG / 10 MCG tablet TAKE 1 TABLET BY MOUTH EVERY DAY 28 tablet 0  . Magnesium Gluconate (MAGNESIUM 27 PO) Take by mouth.    . metroNIDAZOLE (METROGEL) 1 % gel APPLY TO FACE EVERY NIGHT AT BEDTIME AS NEEDED 55 g 0  . montelukast (SINGULAIR) 10 MG tablet Take 10 mg by mouth at bedtime.   2  . Multiple Vitamin (MULTIVITAMIN) tablet Take 1 tablet by mouth daily.    Marland Kitchen. omeprazole (PRILOSEC) 20 MG capsule Take 1 capsule (20 mg total) by mouth 2 (two) times daily before a meal. 60 capsule 5  . polyethylene glycol (MIRALAX / GLYCOLAX) packet Take 17 g by mouth daily.    . Probiotic Product (RESTORA PO) Take by mouth.    . psyllium (METAMUCIL) 58.6 % powder Take 1 packet by mouth daily.     . RESTASIS 0.05 % ophthalmic emulsion INSTILL 1 DROP IN EACH EYE Q 12 H  3  . escitalopram (LEXAPRO) 10 MG tablet Take 1 tablet (10 mg total) by mouth daily. (Patient not taking: Reported on 11/07/2016) 30 tablet 4  . HYDROcodone-homatropine (HYCODAN) 5-1.5 MG/5ML syrup Take 5 mLs by mouth every 6 (six) hours as needed for cough. (Patient not taking: Reported on 10/21/2016) 140 mL 0  . hydrocortisone cream 0.5 % APPLY RECTALLY TWICE DAILY AS DIRECTED FOR 7 DAYS (Patient not taking: Reported on 11/07/2016) 56 g 5  . metroNIDAZOLE (FLAGYL) 250 MG tablet Take 1 tablet (250 mg total) by mouth 3 (three) times daily. (Patient not taking: Reported on 11/07/2016) 30 tablet 0  . omeprazole (PRILOSEC) 20 MG capsule Take 1 capsule (20 mg total) by mouth daily. (Patient not taking: Reported on 11/07/2016) 90 capsule 3   No facility-administered medications prior to visit.         Objective:   Physical Exam Vitals:   11/07/16 1341  BP: 98/70  Pulse: 82  SpO2: 100%  Weight: 138 lb 3.2 oz (62.7 kg)  Height: 5\' 4"  (1.626 m)   Gen: Pleasant, well-nourished, in no distress,  normal affect  ENT: No lesions,  mouth clear,   oropharynx clear, no postnasal drip  Neck: No JVD, no TMG, no carotid bruits  Lungs: No use of accessory muscles, no dullness to percussion, clear without rales or rhonchi  Cardiovascular: RRR, heart sounds normal, no murmur or gallops, no peripheral edema  Musculoskeletal: No deformities, no cyanosis or clubbing  Neuro: alert, non focal  Skin: Warm, no lesions or rashes      Assessment & Plan:  Asthma Childhood asthma. No evidence for obstruction on her current pulmonary function testing. I believe her symptoms were all upper airway in nature and that she does not have asthma. We will stop bronchodilators etc. Reassess if symptoms recur, possibly with a methacholine challenge.  Allergic rhinitis We'll try to step down  her allergy regimen as below. She likely needs to stay on an antihistamine long-term given her allergy profile with Dr [place] Callas.   Solitary pulmonary nodule CT scan of the chest did not show a pulmonary nodule. This was a confluence of shadows on her chest x-ray.  Cough With contributions from allergic rhinitis and also GERD. She is following with GI will likely need to have her PPI adjusted. We will stepdown her allergy regimen.  Levy Pupa, MD, PhD 11/07/2016, 2:08 PM [facility] [phone] or if no answer [phone]

## 2016-11-07 NOTE — Assessment & Plan Note (Signed)
CT scan of the chest did not show a pulmonary nodule. This was a confluence of shadows on her chest x-ray.

## 2016-11-07 NOTE — Assessment & Plan Note (Signed)
We'll try to step down her allergy regimen as below. She likely needs to stay on an antihistamine long-term given her allergy profile with Dr [place] CallasSharma.

## 2016-11-07 NOTE — Patient Instructions (Addendum)
We will continue omeprazole 20mg  once a day Stop singulair.  Continue allegra once a day as you are taking it.  Continue flonase nasal spray for now. If you tolerate stopping the singulair for 2 weeks then you can try stopping the flonase.  Follow with Dr Delton CoombesByrum as needed for any problems with your breathing

## 2016-11-07 NOTE — Assessment & Plan Note (Signed)
With contributions from allergic rhinitis and also GERD. She is following with GI will likely need to have her PPI adjusted. We will stepdown her allergy regimen.

## 2016-11-07 NOTE — Assessment & Plan Note (Signed)
Childhood asthma. No evidence for obstruction on her current pulmonary function testing. I believe her symptoms were all upper airway in nature and that she does not have asthma. We will stop bronchodilators etc. Reassess if symptoms recur, possibly with a methacholine challenge.

## 2016-11-11 DIAGNOSIS — M5126 Other intervertebral disc displacement, lumbar region: Secondary | ICD-10-CM | POA: Diagnosis not present

## 2016-11-11 DIAGNOSIS — M5431 Sciatica, right side: Secondary | ICD-10-CM | POA: Diagnosis not present

## 2016-11-11 DIAGNOSIS — M5416 Radiculopathy, lumbar region: Secondary | ICD-10-CM | POA: Diagnosis not present

## 2016-11-13 DIAGNOSIS — M5431 Sciatica, right side: Secondary | ICD-10-CM | POA: Diagnosis not present

## 2016-11-19 ENCOUNTER — Other Ambulatory Visit: Payer: Self-pay | Admitting: Family Medicine

## 2016-11-19 NOTE — Telephone Encounter (Signed)
Ok plus 2 ref 

## 2016-12-02 ENCOUNTER — Other Ambulatory Visit: Payer: Self-pay | Admitting: Family Medicine

## 2017-01-12 ENCOUNTER — Other Ambulatory Visit: Payer: Self-pay | Admitting: Family Medicine

## 2017-01-26 DIAGNOSIS — R202 Paresthesia of skin: Secondary | ICD-10-CM | POA: Diagnosis not present

## 2017-01-26 DIAGNOSIS — M7701 Medial epicondylitis, right elbow: Secondary | ICD-10-CM | POA: Diagnosis not present

## 2017-01-26 DIAGNOSIS — W540XXD Bitten by dog, subsequent encounter: Secondary | ICD-10-CM | POA: Diagnosis not present

## 2017-01-26 DIAGNOSIS — S41151D Open bite of right upper arm, subsequent encounter: Secondary | ICD-10-CM | POA: Diagnosis not present

## 2017-02-03 ENCOUNTER — Ambulatory Visit: Payer: BLUE CROSS/BLUE SHIELD | Admitting: Neurology

## 2017-02-04 ENCOUNTER — Ambulatory Visit (INDEPENDENT_AMBULATORY_CARE_PROVIDER_SITE_OTHER): Payer: BLUE CROSS/BLUE SHIELD | Admitting: Cardiovascular Disease

## 2017-02-04 ENCOUNTER — Encounter: Payer: Self-pay | Admitting: Cardiovascular Disease

## 2017-02-04 VITALS — BP 118/82 | HR 87 | Ht 64.0 in | Wt 130.0 lb

## 2017-02-04 DIAGNOSIS — I471 Supraventricular tachycardia: Secondary | ICD-10-CM

## 2017-02-04 DIAGNOSIS — F418 Other specified anxiety disorders: Secondary | ICD-10-CM

## 2017-02-04 DIAGNOSIS — R002 Palpitations: Secondary | ICD-10-CM

## 2017-02-04 MED ORDER — DILTIAZEM HCL 30 MG PO TABS: 30.0000 mg | ORAL_TABLET | Freq: Three times a day (TID) | ORAL | 3 refills | Status: DC

## 2017-02-04 NOTE — Patient Instructions (Signed)

## 2017-02-04 NOTE — Progress Notes (Signed)
SUBJECTIVE: The patient presents for follow-up of palpitations. She has a prior history of PSVT. She has a cough related to allergic rhinitis and GERD.  Echocardiogram 08/04/16: Normal left ventricular systolic function and regional wall motion, LVEF 55-60%, grade 1 diastolic dysfunction.  Cardiac monitoring in December 2017 showed sinus rhythm, sinus tachycardia, and occasional PVCs. Average heart rate was 90 bpm. She had symptoms with sinus rhythm, sinus tachycardia, and PVCs.  She has a history of anxiety as well.  She was apparently the victim of a dog attack which bit her left arm approximately one year ago in [place]. Since that time she has struggled with elevated blood pressure and heart rates. She has suffered PTSD since then. She is unable to drive for long distances.  With respect to palpitations, she has done well for the past few months. It took some time to get used to diltiazem but she is tolerating it quite well.  She is going to see an orthopedic surgeon about ruptured disks.   Review of Systems: As per "subjective", otherwise negative.  No Known Allergies  Current Outpatient Prescriptions  Medication Sig Dispense Refill  . ALPRAZolam (XANAX) 0.5 MG tablet TAKE 1/2 TO 1 TABLET BY MOUTH TWICE DAILY AS NEEDED 60 tablet 2  . ANALPRAM-HC 1-1 % rectal cream APPLY TO THE AFFECTED BID  12  . Calcium Carb-Cholecalciferol (CALCIUM 1000 + D PO) Take by mouth 2 (two) times daily.    Marland Kitchen. dicyclomine (BENTYL) 10 MG capsule TAKE 1 CAPSULE(10 MG) BY MOUTH THREE TIMES DAILY 90 capsule 3  . diltiazem (CARDIZEM) 30 MG tablet Take 1 tablet (30 mg total) by mouth 3 (three) times daily. 270 tablet 3  . fexofenadine (ALLEGRA) 180 MG tablet Take 180 mg by mouth daily.    Marland Kitchen. HYDROcodone-acetaminophen (NORCO/VICODIN) 5-325 MG tablet Take one every 4 - 6 hours prn pain 36 tablet 0  . hydrocortisone cream 0.5 % APPLY RECTALLY TWICE DAILY AS DIRECTED FOR 7 DAYS 56 g 5  . LO LOESTRIN FE 1  MG-10 MCG / 10 MCG tablet TAKE 1 TABLET BY MOUTH EVERY DAY 28 tablet 0  . Magnesium 250 MG TABS Take 250 mg by mouth daily.    . metroNIDAZOLE (METROGEL) 1 % gel APPLY TO FACE EVERY NIGHT AT BEDTIME AS NEEDED 55 g 0  . montelukast (SINGULAIR) 10 MG tablet Take 10 mg by mouth at bedtime.   2  . Multiple Vitamin (MULTIVITAMIN) tablet Take 1 tablet by mouth daily.    Marland Kitchen. omeprazole (PRILOSEC) 20 MG capsule Take 1 capsule (20 mg total) by mouth 2 (two) times daily before a meal. 60 capsule 5  . polyethylene glycol (MIRALAX / GLYCOLAX) packet Take 17 g by mouth daily.    . Probiotic Product (RESTORA PO) Take by mouth.    . psyllium (METAMUCIL) 58.6 % powder Take 1 packet by mouth daily.     . RESTASIS 0.05 % ophthalmic emulsion INSTILL 1 DROP IN EACH EYE Q 12 H  3   No current facility-administered medications for this visit.     Past Medical History:  Diagnosis Date  . Asthma   . GERD (gastroesophageal reflux disease)   . GERD (gastroesophageal reflux disease)   . Headache   . Hypertension   . Scoliosis   . Vertigo     Past Surgical History:  Procedure Laterality Date  . COLONOSCOPY      Social History   Social History  . Marital status: Married  Spouse name: N/A  . Number of children: N/A  . Years of education: N/A   Occupational History  . Not on file.   Social History Main Topics  . Smoking status: Never Smoker  . Smokeless tobacco: Never Used  . Alcohol use No  . Drug use: No  . Sexual activity: Yes    Partners: Male   Other Topics Concern  . Not on file   Social History Narrative  . No narrative on file     Vitals:   02/04/17 1319  BP: 118/82  Pulse: 87  SpO2: 98%  Weight: 130 lb (59 kg)  Height: 5\' 4"  (1.626 m)    Wt Readings from Last 3 Encounters:  02/04/17 130 lb (59 kg)  11/07/16 138 lb 3.2 oz (62.7 kg)  10/21/16 138 lb 3.2 oz (62.7 kg)     PHYSICAL EXAM General: NAD HEENT: Normal. Neck: No JVD, no thyromegaly. Lungs: Clear to  auscultation bilaterally with normal respiratory effort. CV: Nondisplaced PMI.  Regular rate and rhythm, normal S1/S2, no S3/S4, no murmur. No pretibial or periankle edema.  No carotid bruit.   Abdomen: Soft, nontender, no distention.  Neurologic: Alert and oriented.  Psych: Normal affect. Skin: Normal. Musculoskeletal: No gross deformities.    ECG: Most recent ECG reviewed.   Labs: Lab Results  Component Value Date/Time   K 4.4 07/24/2016 11:27 AM   BUN 10 07/24/2016 11:27 AM   CREATININE 0.83 07/24/2016 11:27 AM   ALT 17 07/24/2016 11:27 AM   TSH 3.05 07/24/2016 11:27 AM   HGB 14.3 10/21/2016 11:56 AM     Lipids: Lab Results  Component Value Date/Time   LDLCALC 83 07/25/2016 11:06 AM   CHOL 171 07/25/2016 11:06 AM   TRIG 123 07/25/2016 11:06 AM   HDL 63 07/25/2016 11:06 AM       ASSESSMENT AND PLAN: 1. Palpitations with prior history of PSVT: Currently on diltiazem 30 mg three times daily.Symptomatically stable. No changes to therapy.  2. PTSD: On Xanax prn.   Time spent: 25 minutes.  Disposition: Follow up 1 yr.  Prentice Docker, M.D., F.A.C.C.

## 2017-03-09 DIAGNOSIS — M7701 Medial epicondylitis, right elbow: Secondary | ICD-10-CM | POA: Diagnosis not present

## 2017-03-09 DIAGNOSIS — W540XXD Bitten by dog, subsequent encounter: Secondary | ICD-10-CM | POA: Diagnosis not present

## 2017-03-09 DIAGNOSIS — S41151D Open bite of right upper arm, subsequent encounter: Secondary | ICD-10-CM | POA: Diagnosis not present

## 2017-03-17 ENCOUNTER — Other Ambulatory Visit: Payer: Self-pay | Admitting: Family Medicine

## 2017-03-17 NOTE — Telephone Encounter (Signed)
Last seen 02/04/2017

## 2017-03-17 NOTE — Telephone Encounter (Signed)
This is a repeat message already done

## 2017-03-17 NOTE — Telephone Encounter (Signed)
Last seen 09/2016 

## 2017-03-20 ENCOUNTER — Telehealth: Payer: Self-pay | Admitting: Family Medicine

## 2017-03-20 ENCOUNTER — Other Ambulatory Visit: Payer: Self-pay | Admitting: *Deleted

## 2017-03-20 DIAGNOSIS — F419 Anxiety disorder, unspecified: Secondary | ICD-10-CM

## 2017-03-20 DIAGNOSIS — M5126 Other intervertebral disc displacement, lumbar region: Secondary | ICD-10-CM | POA: Diagnosis not present

## 2017-03-20 DIAGNOSIS — M4696 Unspecified inflammatory spondylopathy, lumbar region: Secondary | ICD-10-CM | POA: Diagnosis not present

## 2017-03-20 NOTE — Progress Notes (Unsigned)
mb ref  

## 2017-03-20 NOTE — Telephone Encounter (Signed)
Discussed with pt. Pt states she does not want medicine. She states she  Had bad side effects to lexapro and does not want any medication. She just wants someone to talk through her issues with. Pt states to let dr Brett Canalessteve know this and to put it in notes to the person she is being referred to. Referral put in with this note attached.

## 2017-03-20 NOTE — Telephone Encounter (Signed)
Pt needs to be open to idea she may also need medicine, not just talking to someone, refrral to [facility] for anxiety

## 2017-03-20 NOTE — Telephone Encounter (Signed)
Patient wants to know if Dr. Brett CanalesSteve can recommend or refer her to a specialist to talk to and overcome the mental issues she is dealing with.

## 2017-03-25 ENCOUNTER — Other Ambulatory Visit: Payer: Self-pay | Admitting: Neurosurgery

## 2017-03-25 DIAGNOSIS — M5126 Other intervertebral disc displacement, lumbar region: Secondary | ICD-10-CM

## 2017-03-30 ENCOUNTER — Ambulatory Visit
Admission: RE | Admit: 2017-03-30 | Discharge: 2017-03-30 | Disposition: A | Discharge status: Home / Self Care | Payer: BLUE CROSS/BLUE SHIELD | Source: Ambulatory Visit | Attending: Neurosurgery | Admitting: Neurosurgery

## 2017-03-30 ENCOUNTER — Encounter: Payer: Self-pay | Admitting: Family Medicine

## 2017-03-30 DIAGNOSIS — M5126 Other intervertebral disc displacement, lumbar region: Secondary | ICD-10-CM

## 2017-03-30 DIAGNOSIS — M47817 Spondylosis without myelopathy or radiculopathy, lumbosacral region: Secondary | ICD-10-CM | POA: Diagnosis not present

## 2017-03-30 MED ORDER — METHYLPREDNISOLONE ACETATE 40 MG/ML INJ SUSP (RADIOLOG
120.0000 mg | Freq: Once | INTRAMUSCULAR | Status: AC
  Administered 2017-03-30: 120 mg via EPIDURAL

## 2017-03-30 MED ORDER — IOPAMIDOL (ISOVUE-M 200) INJECTION 41%
1.0000 mL | Freq: Once | INTRAMUSCULAR | Status: AC
  Administered 2017-03-30: 1 mL via EPIDURAL

## 2017-03-30 NOTE — Discharge Instructions (Signed)

## 2017-04-02 ENCOUNTER — Telehealth: Payer: Self-pay | Admitting: Radiology

## 2017-04-02 NOTE — Telephone Encounter (Signed)
C/o pressure across whole back. Feeling much worse than she did before. Asked pt to take her pain meds as ordered as she only takes them at bedtime. She states she will also add Aleve. Explained that sometimes things get worse before they get better. She will follow up with her doctor in 7- 10 days to discuss how she feels if better or worse.

## 2017-04-21 DIAGNOSIS — M4696 Unspecified inflammatory spondylopathy, lumbar region: Secondary | ICD-10-CM | POA: Diagnosis not present

## 2017-04-21 DIAGNOSIS — M5126 Other intervertebral disc displacement, lumbar region: Secondary | ICD-10-CM | POA: Diagnosis not present

## 2017-04-24 ENCOUNTER — Telehealth (HOSPITAL_COMMUNITY): Payer: Self-pay | Admitting: *Deleted

## 2017-04-24 NOTE — Telephone Encounter (Signed)
spoke with patient, she had many questions regarding type of provider providing services and wanted to make sure she would be seeing the provider that can help with her concerns.   she decided that she would rather not be seen in [place] due to she know a lot of people in [place] and she do not want people seeing her going in and out of the office.   She said she would prefer going to [place].   She said she will think about it a little bit and call back if she decides to come here.

## 2017-04-27 ENCOUNTER — Other Ambulatory Visit: Payer: Self-pay | Admitting: Neurosurgery

## 2017-04-27 DIAGNOSIS — M5126 Other intervertebral disc displacement, lumbar region: Secondary | ICD-10-CM

## 2017-04-28 ENCOUNTER — Other Ambulatory Visit: Payer: Self-pay | Admitting: Family Medicine

## 2017-04-28 NOTE — Telephone Encounter (Signed)
Last seen 09/30/16

## 2017-04-29 ENCOUNTER — Telehealth: Payer: Self-pay | Admitting: Family Medicine

## 2017-04-29 NOTE — Telephone Encounter (Signed)
Patient said that the pharmacy notified her that her Xanax was denied and she wants to talk to a nurse to discuss why.  She said this is the only medication she is taking.  [facility]

## 2017-04-29 NOTE — Telephone Encounter (Signed)
Six mo worth 

## 2017-04-29 NOTE — Telephone Encounter (Signed)
Spoke with patient and informed her that her prescriptions was not denied. Prescription for renewal being faxed in today. Patient verbalized understanding.

## 2017-05-01 DIAGNOSIS — M25521 Pain in right elbow: Secondary | ICD-10-CM | POA: Diagnosis not present

## 2017-05-01 DIAGNOSIS — M7701 Medial epicondylitis, right elbow: Secondary | ICD-10-CM | POA: Diagnosis not present

## 2017-05-04 DIAGNOSIS — Z1231 Encounter for screening mammogram for malignant neoplasm of breast: Secondary | ICD-10-CM | POA: Diagnosis not present

## 2017-05-04 DIAGNOSIS — Z6823 Body mass index (BMI) 23.0-23.9, adult: Secondary | ICD-10-CM | POA: Diagnosis not present

## 2017-05-04 DIAGNOSIS — Z01419 Encounter for gynecological examination (general) (routine) without abnormal findings: Secondary | ICD-10-CM | POA: Diagnosis not present

## 2017-05-06 ENCOUNTER — Other Ambulatory Visit: Payer: BLUE CROSS/BLUE SHIELD

## 2017-05-07 ENCOUNTER — Ambulatory Visit
Admission: RE | Admit: 2017-05-07 | Discharge: 2017-05-07 | Disposition: A | Discharge status: Home / Self Care | Payer: BLUE CROSS/BLUE SHIELD | Source: Ambulatory Visit | Attending: Neurosurgery | Admitting: Neurosurgery

## 2017-05-07 ENCOUNTER — Other Ambulatory Visit: Payer: Self-pay | Admitting: Neurosurgery

## 2017-05-07 DIAGNOSIS — M5126 Other intervertebral disc displacement, lumbar region: Secondary | ICD-10-CM

## 2017-05-07 MED ORDER — METHYLPREDNISOLONE ACETATE 40 MG/ML INJ SUSP (RADIOLOG
120.0000 mg | Freq: Once | INTRAMUSCULAR | Status: AC
  Administered 2017-05-07: 120 mg via EPIDURAL

## 2017-05-07 MED ORDER — IOPAMIDOL (ISOVUE-M 200) INJECTION 41%
1.0000 mL | Freq: Once | INTRAMUSCULAR | Status: AC
  Administered 2017-05-07: 1 mL via EPIDURAL

## 2017-05-07 NOTE — Discharge Instructions (Signed)

## 2017-05-08 ENCOUNTER — Telehealth: Payer: Self-pay | Admitting: Radiology

## 2017-05-08 NOTE — Telephone Encounter (Signed)
Pt states she has cramps and pain and is incapacitated from the pain. Pt had an epi yesterday. Pt states she has taken 1/2 of a muscle relaxer. Told pt to take the whole pill and to take her pain meds as well. Told her heat instead of ice might be better for the muscle spasms. Also, she can alternate ice and heat to her comfort. Explained she should use the pain meds and muscle relaxers as ordered for the next 24 hours.

## 2017-06-03 DIAGNOSIS — M4696 Unspecified inflammatory spondylopathy, lumbar region: Secondary | ICD-10-CM | POA: Diagnosis not present

## 2017-06-03 DIAGNOSIS — M5126 Other intervertebral disc displacement, lumbar region: Secondary | ICD-10-CM | POA: Diagnosis not present

## 2017-06-07 ENCOUNTER — Other Ambulatory Visit: Payer: Self-pay | Admitting: Family Medicine

## 2017-06-08 NOTE — Telephone Encounter (Signed)
Last seen 09/30/2016 

## 2017-06-17 DIAGNOSIS — M7701 Medial epicondylitis, right elbow: Secondary | ICD-10-CM | POA: Diagnosis not present

## 2017-06-27 DIAGNOSIS — Z23 Encounter for immunization: Secondary | ICD-10-CM | POA: Diagnosis not present

## 2017-07-24 DIAGNOSIS — K219 Gastro-esophageal reflux disease without esophagitis: Secondary | ICD-10-CM | POA: Diagnosis not present

## 2017-07-24 DIAGNOSIS — K580 Irritable bowel syndrome with diarrhea: Secondary | ICD-10-CM | POA: Diagnosis not present

## 2017-07-24 DIAGNOSIS — R140 Abdominal distension (gaseous): Secondary | ICD-10-CM | POA: Diagnosis not present

## 2017-07-27 ENCOUNTER — Telehealth: Payer: Self-pay | Admitting: Family Medicine

## 2017-07-27 ENCOUNTER — Other Ambulatory Visit: Payer: Self-pay | Admitting: Family Medicine

## 2017-07-27 NOTE — Telephone Encounter (Signed)
Requesting Rx for 90 day supply for Singulair, Metronidazole and nasonex to [facility].  She said that her allergist was prescribing her Singulair and Nasonex but she no longer sees them.

## 2017-07-27 NOTE — Telephone Encounter (Signed)
Sorry needs chronic o v

## 2017-07-27 NOTE — Telephone Encounter (Signed)
Last seen 09/2016 for a cough

## 2017-07-27 NOTE — Telephone Encounter (Signed)
Patient advised she needs an office visit for refills and she stated she will call back and schedule an office visit.

## 2017-07-29 ENCOUNTER — Other Ambulatory Visit: Payer: Self-pay | Admitting: Family Medicine

## 2017-07-29 NOTE — Telephone Encounter (Signed)
Cough 09/2016 was the last time pt seen

## 2017-07-29 NOTE — Telephone Encounter (Signed)
This keeps coming up yest we called pat and said needs o v before refills

## 2017-08-05 DIAGNOSIS — M7701 Medial epicondylitis, right elbow: Secondary | ICD-10-CM | POA: Diagnosis not present

## 2017-08-06 DIAGNOSIS — Z6823 Body mass index (BMI) 23.0-23.9, adult: Secondary | ICD-10-CM | POA: Diagnosis not present

## 2017-08-06 DIAGNOSIS — M5126 Other intervertebral disc displacement, lumbar region: Secondary | ICD-10-CM | POA: Diagnosis not present

## 2017-08-26 DIAGNOSIS — K219 Gastro-esophageal reflux disease without esophagitis: Secondary | ICD-10-CM | POA: Diagnosis not present

## 2017-08-26 DIAGNOSIS — K580 Irritable bowel syndrome with diarrhea: Secondary | ICD-10-CM | POA: Diagnosis not present

## 2017-09-10 DIAGNOSIS — M5126 Other intervertebral disc displacement, lumbar region: Secondary | ICD-10-CM | POA: Diagnosis not present

## 2017-09-10 DIAGNOSIS — M47816 Spondylosis without myelopathy or radiculopathy, lumbar region: Secondary | ICD-10-CM | POA: Diagnosis not present

## 2017-09-10 DIAGNOSIS — Z6824 Body mass index (BMI) 24.0-24.9, adult: Secondary | ICD-10-CM | POA: Diagnosis not present

## 2017-09-16 ENCOUNTER — Other Ambulatory Visit: Payer: Self-pay | Admitting: Neurosurgery

## 2017-09-16 ENCOUNTER — Other Ambulatory Visit: Payer: Self-pay

## 2017-09-16 DIAGNOSIS — M5126 Other intervertebral disc displacement, lumbar region: Secondary | ICD-10-CM

## 2017-09-17 ENCOUNTER — Encounter (INDEPENDENT_AMBULATORY_CARE_PROVIDER_SITE_OTHER): Payer: Self-pay | Admitting: Internal Medicine

## 2017-09-29 ENCOUNTER — Telehealth: Payer: Self-pay | Admitting: Family Medicine

## 2017-09-29 ENCOUNTER — Encounter: Payer: Self-pay | Admitting: Family Medicine

## 2017-09-29 ENCOUNTER — Ambulatory Visit: Payer: BLUE CROSS/BLUE SHIELD | Admitting: Family Medicine

## 2017-09-29 VITALS — BP 144/96 | Temp 99.6°F | Wt 139.0 lb

## 2017-09-29 DIAGNOSIS — B9689 Other specified bacterial agents as the cause of diseases classified elsewhere: Secondary | ICD-10-CM

## 2017-09-29 DIAGNOSIS — J019 Acute sinusitis, unspecified: Secondary | ICD-10-CM

## 2017-09-29 MED ORDER — CEFPROZIL 500 MG PO TABS: 500.0000 mg | ORAL_TABLET | Freq: Two times a day (BID) | ORAL | 0 refills | Status: DC

## 2017-09-29 MED ORDER — BENZONATATE 100 MG PO CAPS: 100.0000 mg | ORAL_CAPSULE | Freq: Four times a day (QID) | ORAL | 0 refills | Status: AC | PRN

## 2017-09-29 MED ORDER — LEVALBUTEROL TARTRATE 45 MCG/ACT IN AERO: 2.0000 | INHALATION_SPRAY | Freq: Four times a day (QID) | RESPIRATORY_TRACT | 2 refills | Status: DC | PRN

## 2017-09-29 NOTE — Telephone Encounter (Signed)
Patient scheduled an appointment with Dr. Brett CanalesSteve at 11:30 am.

## 2017-09-29 NOTE — Telephone Encounter (Signed)
She called back and said she coughed and it was green phlegm.

## 2017-09-29 NOTE — Telephone Encounter (Signed)
Patient said that her father just passed away and she is in mourning.  She said she has a sore throat and its really red, started yesterday, coughing a lot, no fever, getting up a little phlegm, but can't tell what color it is but it tastes sick so she assumes its green.  She is requesting an antibiotic to be called in due to the circumstances.   [facility]

## 2017-09-29 NOTE — Progress Notes (Signed)
   Subjective:    Patient ID: Victoria [name], female    DOB: 02/09/1961, 57 y.o.   MRN: [id number]  Sinusitis  This is a new problem. Episode onset: 2 days. Associated symptoms include coughing. (Wheezing, sore throat, chest tightness, congestion) Treatments tried: hot tea, dimetapp, honey.    pos procutive gunky cough   Pos phlegm  Green phlegmy stuff  congsted and gunky  Throat feels on fire and congested   Review of Systems  Respiratory: Positive for cough.        Objective:   Physical Exam Alert, mild malaise. Hydration good Vitals stable. frontal/ maxillary tenderness evident positive nasal congestion. pharynx normal neck supple  lungs clear/no crackles or wheezes. heart regular in rhythm        Assessment & Plan:  Impression rhinosinusitis likely post viral, discussed with patient. plan antibiotics prescribed. Questions answered. Symptomatic care discussed. warning signs discussed. WSL

## 2017-10-21 ENCOUNTER — Ambulatory Visit (INDEPENDENT_AMBULATORY_CARE_PROVIDER_SITE_OTHER): Payer: BLUE CROSS/BLUE SHIELD | Admitting: Internal Medicine

## 2017-10-28 ENCOUNTER — Other Ambulatory Visit: Payer: Self-pay | Admitting: Family Medicine

## 2017-10-28 NOTE — Telephone Encounter (Signed)
1 refill, patient needs office visit for this issue

## 2017-11-15 ENCOUNTER — Ambulatory Visit
Admission: RE | Admit: 2017-11-15 | Discharge: 2017-11-15 | Disposition: A | Discharge status: Home / Self Care | Payer: BLUE CROSS/BLUE SHIELD | Source: Ambulatory Visit | Attending: Neurosurgery | Admitting: Neurosurgery

## 2017-11-15 DIAGNOSIS — M5126 Other intervertebral disc displacement, lumbar region: Secondary | ICD-10-CM

## 2017-11-15 DIAGNOSIS — M5136 Other intervertebral disc degeneration, lumbar region: Secondary | ICD-10-CM | POA: Diagnosis not present

## 2017-11-18 DIAGNOSIS — M5126 Other intervertebral disc displacement, lumbar region: Secondary | ICD-10-CM | POA: Diagnosis not present

## 2017-11-18 DIAGNOSIS — Z6824 Body mass index (BMI) 24.0-24.9, adult: Secondary | ICD-10-CM | POA: Diagnosis not present

## 2017-11-20 DIAGNOSIS — M25521 Pain in right elbow: Secondary | ICD-10-CM | POA: Diagnosis not present

## 2017-11-20 DIAGNOSIS — M7701 Medial epicondylitis, right elbow: Secondary | ICD-10-CM | POA: Diagnosis not present

## 2017-11-20 DIAGNOSIS — W540XXS Bitten by dog, sequela: Secondary | ICD-10-CM | POA: Diagnosis not present

## 2017-11-24 DIAGNOSIS — M6281 Muscle weakness (generalized): Secondary | ICD-10-CM | POA: Diagnosis not present

## 2017-11-24 DIAGNOSIS — I10 Essential (primary) hypertension: Secondary | ICD-10-CM | POA: Diagnosis not present

## 2017-11-24 DIAGNOSIS — R202 Paresthesia of skin: Secondary | ICD-10-CM | POA: Diagnosis not present

## 2017-11-24 DIAGNOSIS — R262 Difficulty in walking, not elsewhere classified: Secondary | ICD-10-CM | POA: Diagnosis not present

## 2017-11-24 DIAGNOSIS — M25521 Pain in right elbow: Secondary | ICD-10-CM | POA: Diagnosis not present

## 2017-11-24 DIAGNOSIS — F419 Anxiety disorder, unspecified: Secondary | ICD-10-CM | POA: Diagnosis not present

## 2017-11-24 DIAGNOSIS — M5126 Other intervertebral disc displacement, lumbar region: Secondary | ICD-10-CM | POA: Diagnosis not present

## 2017-11-24 DIAGNOSIS — G8929 Other chronic pain: Secondary | ICD-10-CM | POA: Diagnosis not present

## 2017-11-24 DIAGNOSIS — M542 Cervicalgia: Secondary | ICD-10-CM | POA: Diagnosis not present

## 2017-11-24 DIAGNOSIS — I471 Supraventricular tachycardia: Secondary | ICD-10-CM | POA: Diagnosis not present

## 2017-12-01 DIAGNOSIS — M5126 Other intervertebral disc displacement, lumbar region: Secondary | ICD-10-CM | POA: Diagnosis not present

## 2017-12-01 DIAGNOSIS — F419 Anxiety disorder, unspecified: Secondary | ICD-10-CM | POA: Diagnosis not present

## 2017-12-01 DIAGNOSIS — I10 Essential (primary) hypertension: Secondary | ICD-10-CM | POA: Diagnosis not present

## 2017-12-01 DIAGNOSIS — R202 Paresthesia of skin: Secondary | ICD-10-CM | POA: Diagnosis not present

## 2017-12-01 DIAGNOSIS — R262 Difficulty in walking, not elsewhere classified: Secondary | ICD-10-CM | POA: Diagnosis not present

## 2017-12-01 DIAGNOSIS — M25521 Pain in right elbow: Secondary | ICD-10-CM | POA: Diagnosis not present

## 2017-12-01 DIAGNOSIS — G8929 Other chronic pain: Secondary | ICD-10-CM | POA: Diagnosis not present

## 2017-12-01 DIAGNOSIS — M6281 Muscle weakness (generalized): Secondary | ICD-10-CM | POA: Diagnosis not present

## 2017-12-01 DIAGNOSIS — I471 Supraventricular tachycardia: Secondary | ICD-10-CM | POA: Diagnosis not present

## 2017-12-01 DIAGNOSIS — M542 Cervicalgia: Secondary | ICD-10-CM | POA: Diagnosis not present

## 2017-12-03 DIAGNOSIS — R202 Paresthesia of skin: Secondary | ICD-10-CM | POA: Diagnosis not present

## 2017-12-03 DIAGNOSIS — M5126 Other intervertebral disc displacement, lumbar region: Secondary | ICD-10-CM | POA: Diagnosis not present

## 2017-12-03 DIAGNOSIS — R262 Difficulty in walking, not elsewhere classified: Secondary | ICD-10-CM | POA: Diagnosis not present

## 2017-12-03 DIAGNOSIS — M542 Cervicalgia: Secondary | ICD-10-CM | POA: Diagnosis not present

## 2017-12-03 DIAGNOSIS — F419 Anxiety disorder, unspecified: Secondary | ICD-10-CM | POA: Diagnosis not present

## 2017-12-03 DIAGNOSIS — G8929 Other chronic pain: Secondary | ICD-10-CM | POA: Diagnosis not present

## 2017-12-03 DIAGNOSIS — M25521 Pain in right elbow: Secondary | ICD-10-CM | POA: Diagnosis not present

## 2017-12-03 DIAGNOSIS — I471 Supraventricular tachycardia: Secondary | ICD-10-CM | POA: Diagnosis not present

## 2017-12-03 DIAGNOSIS — I10 Essential (primary) hypertension: Secondary | ICD-10-CM | POA: Diagnosis not present

## 2017-12-03 DIAGNOSIS — M6281 Muscle weakness (generalized): Secondary | ICD-10-CM | POA: Diagnosis not present

## 2017-12-08 DIAGNOSIS — G8929 Other chronic pain: Secondary | ICD-10-CM | POA: Diagnosis not present

## 2017-12-08 DIAGNOSIS — M6281 Muscle weakness (generalized): Secondary | ICD-10-CM | POA: Diagnosis not present

## 2017-12-08 DIAGNOSIS — M545 Low back pain: Secondary | ICD-10-CM | POA: Diagnosis not present

## 2017-12-08 DIAGNOSIS — R262 Difficulty in walking, not elsewhere classified: Secondary | ICD-10-CM | POA: Diagnosis not present

## 2017-12-15 DIAGNOSIS — M545 Low back pain: Secondary | ICD-10-CM | POA: Diagnosis not present

## 2017-12-15 DIAGNOSIS — R262 Difficulty in walking, not elsewhere classified: Secondary | ICD-10-CM | POA: Diagnosis not present

## 2017-12-15 DIAGNOSIS — M6281 Muscle weakness (generalized): Secondary | ICD-10-CM | POA: Diagnosis not present

## 2017-12-15 DIAGNOSIS — G8929 Other chronic pain: Secondary | ICD-10-CM | POA: Diagnosis not present

## 2017-12-17 DIAGNOSIS — G8929 Other chronic pain: Secondary | ICD-10-CM | POA: Diagnosis not present

## 2017-12-17 DIAGNOSIS — R262 Difficulty in walking, not elsewhere classified: Secondary | ICD-10-CM | POA: Diagnosis not present

## 2017-12-17 DIAGNOSIS — M545 Low back pain: Secondary | ICD-10-CM | POA: Diagnosis not present

## 2017-12-17 DIAGNOSIS — M6281 Muscle weakness (generalized): Secondary | ICD-10-CM | POA: Diagnosis not present

## 2017-12-22 DIAGNOSIS — M545 Low back pain: Secondary | ICD-10-CM | POA: Diagnosis not present

## 2017-12-22 DIAGNOSIS — M6281 Muscle weakness (generalized): Secondary | ICD-10-CM | POA: Diagnosis not present

## 2017-12-22 DIAGNOSIS — R262 Difficulty in walking, not elsewhere classified: Secondary | ICD-10-CM | POA: Diagnosis not present

## 2017-12-22 DIAGNOSIS — G8929 Other chronic pain: Secondary | ICD-10-CM | POA: Diagnosis not present

## 2017-12-30 DIAGNOSIS — M6281 Muscle weakness (generalized): Secondary | ICD-10-CM | POA: Diagnosis not present

## 2017-12-30 DIAGNOSIS — M545 Low back pain: Secondary | ICD-10-CM | POA: Diagnosis not present

## 2017-12-30 DIAGNOSIS — G8929 Other chronic pain: Secondary | ICD-10-CM | POA: Diagnosis not present

## 2017-12-30 DIAGNOSIS — R262 Difficulty in walking, not elsewhere classified: Secondary | ICD-10-CM | POA: Diagnosis not present

## 2018-01-01 DIAGNOSIS — M7701 Medial epicondylitis, right elbow: Secondary | ICD-10-CM | POA: Diagnosis not present

## 2018-01-01 DIAGNOSIS — G8929 Other chronic pain: Secondary | ICD-10-CM | POA: Diagnosis not present

## 2018-01-01 DIAGNOSIS — M25521 Pain in right elbow: Secondary | ICD-10-CM | POA: Diagnosis not present

## 2018-01-01 DIAGNOSIS — R262 Difficulty in walking, not elsewhere classified: Secondary | ICD-10-CM | POA: Diagnosis not present

## 2018-01-01 DIAGNOSIS — M545 Low back pain: Secondary | ICD-10-CM | POA: Diagnosis not present

## 2018-01-01 DIAGNOSIS — M6281 Muscle weakness (generalized): Secondary | ICD-10-CM | POA: Diagnosis not present

## 2018-01-05 DIAGNOSIS — M47816 Spondylosis without myelopathy or radiculopathy, lumbar region: Secondary | ICD-10-CM | POA: Diagnosis not present

## 2018-01-05 DIAGNOSIS — M5126 Other intervertebral disc displacement, lumbar region: Secondary | ICD-10-CM | POA: Diagnosis not present

## 2018-01-25 ENCOUNTER — Other Ambulatory Visit: Payer: Self-pay | Admitting: Cardiology

## 2018-01-26 DIAGNOSIS — M542 Cervicalgia: Secondary | ICD-10-CM | POA: Diagnosis not present

## 2018-01-26 DIAGNOSIS — M25521 Pain in right elbow: Secondary | ICD-10-CM | POA: Diagnosis not present

## 2018-01-26 DIAGNOSIS — M5126 Other intervertebral disc displacement, lumbar region: Secondary | ICD-10-CM | POA: Diagnosis not present

## 2018-03-03 DIAGNOSIS — M25521 Pain in right elbow: Secondary | ICD-10-CM | POA: Diagnosis not present

## 2018-03-03 DIAGNOSIS — M542 Cervicalgia: Secondary | ICD-10-CM | POA: Diagnosis not present

## 2018-03-03 DIAGNOSIS — M5126 Other intervertebral disc displacement, lumbar region: Secondary | ICD-10-CM | POA: Diagnosis not present

## 2018-03-05 DIAGNOSIS — M25521 Pain in right elbow: Secondary | ICD-10-CM | POA: Diagnosis not present

## 2018-03-05 DIAGNOSIS — M542 Cervicalgia: Secondary | ICD-10-CM | POA: Diagnosis not present

## 2018-03-05 DIAGNOSIS — M5126 Other intervertebral disc displacement, lumbar region: Secondary | ICD-10-CM | POA: Diagnosis not present

## 2018-03-08 DIAGNOSIS — M542 Cervicalgia: Secondary | ICD-10-CM | POA: Diagnosis not present

## 2018-03-08 DIAGNOSIS — M5126 Other intervertebral disc displacement, lumbar region: Secondary | ICD-10-CM | POA: Diagnosis not present

## 2018-03-08 DIAGNOSIS — M25521 Pain in right elbow: Secondary | ICD-10-CM | POA: Diagnosis not present

## 2018-03-08 DIAGNOSIS — Z029 Encounter for administrative examinations, unspecified: Secondary | ICD-10-CM

## 2018-03-16 DIAGNOSIS — M542 Cervicalgia: Secondary | ICD-10-CM | POA: Diagnosis not present

## 2018-03-16 DIAGNOSIS — M25521 Pain in right elbow: Secondary | ICD-10-CM | POA: Diagnosis not present

## 2018-03-16 DIAGNOSIS — M5126 Other intervertebral disc displacement, lumbar region: Secondary | ICD-10-CM | POA: Diagnosis not present

## 2018-03-18 DIAGNOSIS — M25521 Pain in right elbow: Secondary | ICD-10-CM | POA: Diagnosis not present

## 2018-03-18 DIAGNOSIS — M5126 Other intervertebral disc displacement, lumbar region: Secondary | ICD-10-CM | POA: Diagnosis not present

## 2018-03-18 DIAGNOSIS — M542 Cervicalgia: Secondary | ICD-10-CM | POA: Diagnosis not present

## 2018-03-22 DIAGNOSIS — F4312 Post-traumatic stress disorder, chronic: Secondary | ICD-10-CM | POA: Diagnosis not present

## 2018-03-24 DIAGNOSIS — M5126 Other intervertebral disc displacement, lumbar region: Secondary | ICD-10-CM | POA: Diagnosis not present

## 2018-03-24 DIAGNOSIS — M542 Cervicalgia: Secondary | ICD-10-CM | POA: Diagnosis not present

## 2018-03-24 DIAGNOSIS — M25521 Pain in right elbow: Secondary | ICD-10-CM | POA: Diagnosis not present

## 2018-03-25 ENCOUNTER — Other Ambulatory Visit: Payer: Self-pay | Admitting: Family Medicine

## 2018-03-25 DIAGNOSIS — M25521 Pain in right elbow: Secondary | ICD-10-CM | POA: Diagnosis not present

## 2018-03-25 DIAGNOSIS — M5126 Other intervertebral disc displacement, lumbar region: Secondary | ICD-10-CM | POA: Diagnosis not present

## 2018-03-25 DIAGNOSIS — M542 Cervicalgia: Secondary | ICD-10-CM | POA: Diagnosis not present

## 2018-03-26 NOTE — Telephone Encounter (Signed)
May have medications same instructions but only 30 tablets, needs office visit

## 2018-03-29 DIAGNOSIS — F4312 Post-traumatic stress disorder, chronic: Secondary | ICD-10-CM | POA: Diagnosis not present

## 2018-03-30 DIAGNOSIS — M542 Cervicalgia: Secondary | ICD-10-CM | POA: Diagnosis not present

## 2018-03-30 DIAGNOSIS — M5126 Other intervertebral disc displacement, lumbar region: Secondary | ICD-10-CM | POA: Diagnosis not present

## 2018-03-30 DIAGNOSIS — M25521 Pain in right elbow: Secondary | ICD-10-CM | POA: Diagnosis not present

## 2018-04-12 DIAGNOSIS — F4312 Post-traumatic stress disorder, chronic: Secondary | ICD-10-CM | POA: Diagnosis not present

## 2018-04-13 DIAGNOSIS — M5126 Other intervertebral disc displacement, lumbar region: Secondary | ICD-10-CM | POA: Diagnosis not present

## 2018-04-13 DIAGNOSIS — M542 Cervicalgia: Secondary | ICD-10-CM | POA: Diagnosis not present

## 2018-04-13 DIAGNOSIS — M25521 Pain in right elbow: Secondary | ICD-10-CM | POA: Diagnosis not present

## 2018-04-19 DIAGNOSIS — F4312 Post-traumatic stress disorder, chronic: Secondary | ICD-10-CM | POA: Diagnosis not present

## 2018-04-21 DIAGNOSIS — M25521 Pain in right elbow: Secondary | ICD-10-CM | POA: Diagnosis not present

## 2018-04-21 DIAGNOSIS — M542 Cervicalgia: Secondary | ICD-10-CM | POA: Diagnosis not present

## 2018-04-21 DIAGNOSIS — M5126 Other intervertebral disc displacement, lumbar region: Secondary | ICD-10-CM | POA: Diagnosis not present

## 2018-04-26 DIAGNOSIS — F4312 Post-traumatic stress disorder, chronic: Secondary | ICD-10-CM | POA: Diagnosis not present

## 2018-04-28 DIAGNOSIS — M5126 Other intervertebral disc displacement, lumbar region: Secondary | ICD-10-CM | POA: Diagnosis not present

## 2018-04-28 DIAGNOSIS — M542 Cervicalgia: Secondary | ICD-10-CM | POA: Diagnosis not present

## 2018-04-28 DIAGNOSIS — M25521 Pain in right elbow: Secondary | ICD-10-CM | POA: Diagnosis not present

## 2018-05-04 DIAGNOSIS — F4312 Post-traumatic stress disorder, chronic: Secondary | ICD-10-CM | POA: Diagnosis not present

## 2018-05-07 DIAGNOSIS — M542 Cervicalgia: Secondary | ICD-10-CM | POA: Diagnosis not present

## 2018-05-07 DIAGNOSIS — M5126 Other intervertebral disc displacement, lumbar region: Secondary | ICD-10-CM | POA: Diagnosis not present

## 2018-05-07 DIAGNOSIS — M25521 Pain in right elbow: Secondary | ICD-10-CM | POA: Diagnosis not present

## 2018-05-11 DIAGNOSIS — F4312 Post-traumatic stress disorder, chronic: Secondary | ICD-10-CM | POA: Diagnosis not present

## 2018-05-12 DIAGNOSIS — M25521 Pain in right elbow: Secondary | ICD-10-CM | POA: Diagnosis not present

## 2018-05-12 DIAGNOSIS — M5126 Other intervertebral disc displacement, lumbar region: Secondary | ICD-10-CM | POA: Diagnosis not present

## 2018-05-12 DIAGNOSIS — M542 Cervicalgia: Secondary | ICD-10-CM | POA: Diagnosis not present

## 2018-05-17 DIAGNOSIS — M25521 Pain in right elbow: Secondary | ICD-10-CM | POA: Diagnosis not present

## 2018-05-17 DIAGNOSIS — M542 Cervicalgia: Secondary | ICD-10-CM | POA: Diagnosis not present

## 2018-05-17 DIAGNOSIS — M5126 Other intervertebral disc displacement, lumbar region: Secondary | ICD-10-CM | POA: Diagnosis not present

## 2018-05-18 DIAGNOSIS — F4312 Post-traumatic stress disorder, chronic: Secondary | ICD-10-CM | POA: Diagnosis not present

## 2018-05-21 DIAGNOSIS — M542 Cervicalgia: Secondary | ICD-10-CM | POA: Diagnosis not present

## 2018-05-21 DIAGNOSIS — M25521 Pain in right elbow: Secondary | ICD-10-CM | POA: Diagnosis not present

## 2018-05-21 DIAGNOSIS — M5126 Other intervertebral disc displacement, lumbar region: Secondary | ICD-10-CM | POA: Diagnosis not present

## 2018-05-24 DIAGNOSIS — M5126 Other intervertebral disc displacement, lumbar region: Secondary | ICD-10-CM | POA: Diagnosis not present

## 2018-05-24 DIAGNOSIS — M25521 Pain in right elbow: Secondary | ICD-10-CM | POA: Diagnosis not present

## 2018-05-24 DIAGNOSIS — M542 Cervicalgia: Secondary | ICD-10-CM | POA: Diagnosis not present

## 2018-05-25 DIAGNOSIS — F4312 Post-traumatic stress disorder, chronic: Secondary | ICD-10-CM | POA: Diagnosis not present

## 2018-06-01 DIAGNOSIS — F4312 Post-traumatic stress disorder, chronic: Secondary | ICD-10-CM | POA: Diagnosis not present

## 2018-06-02 DIAGNOSIS — F4312 Post-traumatic stress disorder, chronic: Secondary | ICD-10-CM | POA: Diagnosis not present

## 2018-06-02 DIAGNOSIS — M542 Cervicalgia: Secondary | ICD-10-CM | POA: Diagnosis not present

## 2018-06-02 DIAGNOSIS — M25521 Pain in right elbow: Secondary | ICD-10-CM | POA: Diagnosis not present

## 2018-06-02 DIAGNOSIS — M5126 Other intervertebral disc displacement, lumbar region: Secondary | ICD-10-CM | POA: Diagnosis not present

## 2018-06-07 ENCOUNTER — Other Ambulatory Visit: Payer: Self-pay | Admitting: Cardiology

## 2018-06-07 DIAGNOSIS — M25521 Pain in right elbow: Secondary | ICD-10-CM | POA: Diagnosis not present

## 2018-06-07 DIAGNOSIS — M542 Cervicalgia: Secondary | ICD-10-CM | POA: Diagnosis not present

## 2018-06-07 DIAGNOSIS — M5126 Other intervertebral disc displacement, lumbar region: Secondary | ICD-10-CM | POA: Diagnosis not present

## 2018-06-07 DIAGNOSIS — F4312 Post-traumatic stress disorder, chronic: Secondary | ICD-10-CM | POA: Diagnosis not present

## 2018-06-07 MED ORDER — DILTIAZEM HCL 30 MG PO TABS: 30.0000 mg | ORAL_TABLET | Freq: Three times a day (TID) | ORAL | 0 refills | Status: DC

## 2018-06-07 NOTE — Telephone Encounter (Signed)
° ° °  1. Which medications need to be refilled? (please list name of each medication and dose if known)    diltiazem (CARDIZEM) 30 MG tablet  2. Which pharmacy/location (including street and city if local pharmacy) is medication to be sent to?   [facility] -, [place]  3. Do they need a 30 day or 90 day supply?   90

## 2018-06-08 DIAGNOSIS — K580 Irritable bowel syndrome with diarrhea: Secondary | ICD-10-CM | POA: Diagnosis not present

## 2018-06-09 ENCOUNTER — Encounter: Payer: Self-pay | Admitting: Family Medicine

## 2018-06-09 ENCOUNTER — Ambulatory Visit: Payer: BLUE CROSS/BLUE SHIELD | Admitting: Family Medicine

## 2018-06-09 VITALS — BP 142/86 | Wt 145.2 lb

## 2018-06-09 DIAGNOSIS — F411 Generalized anxiety disorder: Secondary | ICD-10-CM

## 2018-06-09 DIAGNOSIS — K219 Gastro-esophageal reflux disease without esophagitis: Secondary | ICD-10-CM

## 2018-06-09 DIAGNOSIS — I10 Essential (primary) hypertension: Secondary | ICD-10-CM

## 2018-06-09 DIAGNOSIS — R000 Tachycardia, unspecified: Secondary | ICD-10-CM | POA: Diagnosis not present

## 2018-06-09 DIAGNOSIS — J45909 Unspecified asthma, uncomplicated: Secondary | ICD-10-CM | POA: Diagnosis not present

## 2018-06-09 MED ORDER — CYCLOBENZAPRINE HCL 10 MG PO TABS: 10.0000 mg | ORAL_TABLET | Freq: Every day | ORAL | 11 refills | Status: DC | PRN

## 2018-06-09 MED ORDER — MONTELUKAST SODIUM 10 MG PO TABS: 10.0000 mg | ORAL_TABLET | Freq: Every day | ORAL | 11 refills | Status: DC

## 2018-06-09 MED ORDER — ALPRAZOLAM 0.5 MG PO TABS: ORAL_TABLET | ORAL | 5 refills | Status: DC

## 2018-06-09 MED ORDER — FLUTICASONE PROPIONATE 50 MCG/ACT NA SUSP: 2.0000 | Freq: Every day | NASAL | 11 refills | Status: DC

## 2018-06-09 MED ORDER — DEXILANT 60 MG PO CPDR: DELAYED_RELEASE_CAPSULE | ORAL | 11 refills | Status: DC

## 2018-06-09 MED ORDER — METRONIDAZOLE 1 % EX GEL: CUTANEOUS | 11 refills | Status: DC

## 2018-06-09 NOTE — Progress Notes (Signed)
   Subjective:    Patient ID: Victoria [name], female    DOB: 1960/12/11, 57 y.o.   MRN: [id number]  Hypertension  This is a chronic problem. The current episode started more than 1 year ago. Risk factors for coronary artery disease include post-menopausal state. Treatments tried: cardizem. There are no compliance problems.     Pt in for "I guess a med ck appt"  Patient compliant with insomnia medication. Generally takes most nights. No obvious morning drowsiness. Definitely helps patient sleep. Without it patient states would not get a good nights rest.  uses xanax on occas for aniety and uses reg for sleep at night   Now on cardizem 30 tid, uses for both hypertension nd incr heart rate, gets occas weak and weir d feeling   HR back and for th a bit  BP has been on th high side  Takes singulair fiathfully. Helps wheezing . Helps allergy also. Has helped both    onoging challenge with reflux and hearburn , deilant works overall pretty well      fonase take  Faith fully f  Exercise overall pretty gooed    PT deep tissue , massage and very helpful, doing counselling also      Review of Systems No headache, no major weight loss or weight gain, no chest pain no back pain abdominal pain no change in bowel habits complete ROS otherwise negative     Objective:   Physical Exam  Alert and oriented, vitals reviewed and stable, NAD ENT-TM's and ext canals WNL bilat via otoscopic exam Soft palate, tonsils and post pharynx WNL via oropharyngeal exam Neck-symmetric, no masses; thyroid nonpalpable and nontender Pulmonary-no tachypnea or accessory muscle use; Clear without wheezes via auscultation Card--no abnrml murmurs, rhythm reg and rate WNL Carotid pulses symmetric, without bruits       Assessment & Plan:  Impression 1 chronic anxiety.  Definitely needs her chronic Xanax.  40/month should cover needs.  Uses 1 nightly and 1 as needed during the day.  Fortunately patient also  seeking counseling for her chronic PAT ST element  2.  Asthma.  Clinically stable with need for renewal meds meds refilled  3.  Allergic rhinitis ongoing discussed to maintain same meds  4.  Chronic reflux.  Compliant on medication.  5.  Hypertension along with tachyarrhythmias.  Patient wondered if we would take her cardiologist prescription I recommended she stay with her cardiologist in this regard.  Diet exercise discussed.  Medications refilled.  Follow-up in 6 months.

## 2018-06-11 DIAGNOSIS — Z23 Encounter for immunization: Secondary | ICD-10-CM | POA: Diagnosis not present

## 2018-06-14 DIAGNOSIS — M542 Cervicalgia: Secondary | ICD-10-CM | POA: Diagnosis not present

## 2018-06-14 DIAGNOSIS — M25521 Pain in right elbow: Secondary | ICD-10-CM | POA: Diagnosis not present

## 2018-06-14 DIAGNOSIS — M5126 Other intervertebral disc displacement, lumbar region: Secondary | ICD-10-CM | POA: Diagnosis not present

## 2018-06-14 DIAGNOSIS — F4312 Post-traumatic stress disorder, chronic: Secondary | ICD-10-CM | POA: Diagnosis not present

## 2018-06-15 DIAGNOSIS — F4312 Post-traumatic stress disorder, chronic: Secondary | ICD-10-CM | POA: Diagnosis not present

## 2018-06-17 DIAGNOSIS — M7701 Medial epicondylitis, right elbow: Secondary | ICD-10-CM | POA: Diagnosis not present

## 2018-06-17 DIAGNOSIS — S41151D Open bite of right upper arm, subsequent encounter: Secondary | ICD-10-CM | POA: Diagnosis not present

## 2018-06-21 DIAGNOSIS — M542 Cervicalgia: Secondary | ICD-10-CM | POA: Diagnosis not present

## 2018-06-21 DIAGNOSIS — M25521 Pain in right elbow: Secondary | ICD-10-CM | POA: Diagnosis not present

## 2018-06-21 DIAGNOSIS — M5126 Other intervertebral disc displacement, lumbar region: Secondary | ICD-10-CM | POA: Diagnosis not present

## 2018-06-21 DIAGNOSIS — F4312 Post-traumatic stress disorder, chronic: Secondary | ICD-10-CM | POA: Diagnosis not present

## 2018-06-22 DIAGNOSIS — F4312 Post-traumatic stress disorder, chronic: Secondary | ICD-10-CM | POA: Diagnosis not present

## 2018-06-28 DIAGNOSIS — M542 Cervicalgia: Secondary | ICD-10-CM | POA: Diagnosis not present

## 2018-06-28 DIAGNOSIS — M25521 Pain in right elbow: Secondary | ICD-10-CM | POA: Diagnosis not present

## 2018-06-28 DIAGNOSIS — F4312 Post-traumatic stress disorder, chronic: Secondary | ICD-10-CM | POA: Diagnosis not present

## 2018-06-28 DIAGNOSIS — M5126 Other intervertebral disc displacement, lumbar region: Secondary | ICD-10-CM | POA: Diagnosis not present

## 2018-06-29 DIAGNOSIS — F4312 Post-traumatic stress disorder, chronic: Secondary | ICD-10-CM | POA: Diagnosis not present

## 2018-07-05 DIAGNOSIS — F4312 Post-traumatic stress disorder, chronic: Secondary | ICD-10-CM | POA: Diagnosis not present

## 2018-07-06 DIAGNOSIS — M5126 Other intervertebral disc displacement, lumbar region: Secondary | ICD-10-CM | POA: Diagnosis not present

## 2018-07-06 DIAGNOSIS — F4312 Post-traumatic stress disorder, chronic: Secondary | ICD-10-CM | POA: Diagnosis not present

## 2018-07-06 DIAGNOSIS — M542 Cervicalgia: Secondary | ICD-10-CM | POA: Diagnosis not present

## 2018-07-06 DIAGNOSIS — M25521 Pain in right elbow: Secondary | ICD-10-CM | POA: Diagnosis not present

## 2018-07-09 ENCOUNTER — Ambulatory Visit (INDEPENDENT_AMBULATORY_CARE_PROVIDER_SITE_OTHER): Payer: BLUE CROSS/BLUE SHIELD | Admitting: Family Medicine

## 2018-07-09 ENCOUNTER — Encounter (INDEPENDENT_AMBULATORY_CARE_PROVIDER_SITE_OTHER): Payer: Self-pay | Admitting: Family Medicine

## 2018-07-09 DIAGNOSIS — M79601 Pain in right arm: Secondary | ICD-10-CM | POA: Diagnosis not present

## 2018-07-09 NOTE — Progress Notes (Signed)
Office Visit Note   Patient: Victoria [name]           Date of Birth: 1960/12/05           MRN: [id number] Visit Date: 07/09/2018 Requested by: [name], MD [address] [place], [place] [address] PCP: [name], MD  Subjective: Chief Complaint  Patient presents with  . Right Arm - Pain    S/p dog bite 2 years ago - arm has never been right since - pain is mostly in medial elbow    HPI: She is a 57 year old right-hand-dominant female seen at the request of [name] at [facility] for right upper extremity pain.  2 years ago she was bitten by a Doberman pinscher outside of an apartment complex.  The dog clamped onto her upper arm causing a crush/puncture injury to the lateral aspect and bruising on the medial aspect of her mid humerus area.  She was in [facility] at the time and was treated at an emergency facility with irrigation, antibiotics, and tetanus booster.  She has struggled with pain since then.  She has had MRI scan of her elbow which was reportedly normal.  She has had multiple cortisone injections into the medial epicondyle providing temporary relief.  She continues to complain of intermittent numbness in her arm, mostly on the dorsal aspect of her hand.  She feels pain on a daily basis and a sensation of weakness.  She is now working with a new physical therapist who is doing some myofascial release techniques which are finally starting to give her some improvement in symptoms.  She never had troubles with her arm prior to this injury.               ROS: Otherwise noncontributory  Objective: Vital Signs: There were no vitals taken for this visit.  Physical Exam:  Right arm: She has soft tissue subcutaneous nodules on the medial side of her upper arm which are tender to palpation.  She is tender along the course of the ulnar nerve all the way to the medial elbow.  The nerve does not sublux from its groove with flexion  and extension of the elbow.  There is no atrophy of her forearm or hand muscles.  She is tender to palpation at the common flexor tendon at the medial epicondyle.  Imaging: Musculoskeletal ultrasound: The ulnar nerve was followed from the ulnar groove at the elbow proximally to the mid/upper humerus area.  Proximally, the nerve is slightly hypoechoic and enlarged.  It then tapers until it reaches the ulnar groove at which time it is enlarged again.  There appears to be some subcutaneous scar tissue proximally in the region of the ulnar nerve, and the surrounding tissues.  No aneurysm seen in the blood vessels.  Assessment & Plan: 1.  Chronic right upper extremity pain and paresthesias, suspect injury to the ulnar nerve and possible scar tissue entrapment.  She seems to have medial epicondylitis as well. -I think we should proceed with nerve conduction studies.  If injury to the ulnar nerve confirmed, and entrapment suspected, then possible surgical release. -If the nerve seems to be functioning normally, then continue with physical therapy.   Follow-Up Instructions: No follow-ups on file.       Procedures: None today.   PMFS History: Patient Active Problem List   Diagnosis Date Noted  . Essential hypertension 06/09/2018  . Cough 10/03/2016  . Solitary pulmonary  nodule 10/03/2016  . Anxiety state 10/01/2016  . Asthma 09/30/2016  . Dizziness 08/20/2016  . Other fatigue 08/20/2016  . 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 Palpitations 07/21/2014  . Tachycardia 07/21/2014  . Irritable bowel syndrome 09/11/2013  . Insomnia 09/11/2013  . Generalized anxiety disorder 09/11/2013  . Esophageal reflux 09/11/2013  . Allergic rhinitis 09/11/2013   Past Medical History:  Diagnosis Date  . Asthma   . GERD (gastroesophageal reflux disease)   . GERD (gastroesophageal reflux disease)   . Headache   . Hypertension   . Scoliosis   . Vertigo     Family History  Problem Relation Age of  Onset  . Heart attack Father   . Stroke Father     Past Surgical History:  Procedure Laterality Date  . COLONOSCOPY     Social History   Occupational History  . Not on file  Tobacco Use  . Smoking status: Never Smoker  . Smokeless tobacco: Never Used  Substance and Sexual Activity  . Alcohol use: No    Alcohol/week: 0.0 standard drinks  . Drug use: No  . Sexual activity: Yes    Partners: Male

## 2018-07-15 DIAGNOSIS — M542 Cervicalgia: Secondary | ICD-10-CM | POA: Diagnosis not present

## 2018-07-15 DIAGNOSIS — M25521 Pain in right elbow: Secondary | ICD-10-CM | POA: Diagnosis not present

## 2018-07-15 DIAGNOSIS — M5126 Other intervertebral disc displacement, lumbar region: Secondary | ICD-10-CM | POA: Diagnosis not present

## 2018-07-15 DIAGNOSIS — F4312 Post-traumatic stress disorder, chronic: Secondary | ICD-10-CM | POA: Diagnosis not present

## 2018-07-19 ENCOUNTER — Telehealth (INDEPENDENT_AMBULATORY_CARE_PROVIDER_SITE_OTHER): Payer: Self-pay | Admitting: Family Medicine

## 2018-07-19 DIAGNOSIS — K640 First degree hemorrhoids: Secondary | ICD-10-CM | POA: Diagnosis not present

## 2018-07-19 DIAGNOSIS — K219 Gastro-esophageal reflux disease without esophagitis: Secondary | ICD-10-CM | POA: Diagnosis not present

## 2018-07-19 DIAGNOSIS — L859 Epidermal thickening, unspecified: Secondary | ICD-10-CM | POA: Diagnosis not present

## 2018-07-19 NOTE — Telephone Encounter (Signed)
Patient called for copy of 07/09/2018 ov note,wants to come pick up today. The note is ready and she will sign release when she comes in

## 2018-07-20 DIAGNOSIS — M25521 Pain in right elbow: Secondary | ICD-10-CM | POA: Diagnosis not present

## 2018-07-20 DIAGNOSIS — M542 Cervicalgia: Secondary | ICD-10-CM | POA: Diagnosis not present

## 2018-07-20 DIAGNOSIS — M5126 Other intervertebral disc displacement, lumbar region: Secondary | ICD-10-CM | POA: Diagnosis not present

## 2018-07-20 DIAGNOSIS — F4312 Post-traumatic stress disorder, chronic: Secondary | ICD-10-CM | POA: Diagnosis not present

## 2018-07-27 DIAGNOSIS — M5126 Other intervertebral disc displacement, lumbar region: Secondary | ICD-10-CM | POA: Diagnosis not present

## 2018-07-27 DIAGNOSIS — F4312 Post-traumatic stress disorder, chronic: Secondary | ICD-10-CM | POA: Diagnosis not present

## 2018-07-27 DIAGNOSIS — M542 Cervicalgia: Secondary | ICD-10-CM | POA: Diagnosis not present

## 2018-07-27 DIAGNOSIS — M25521 Pain in right elbow: Secondary | ICD-10-CM | POA: Diagnosis not present

## 2018-08-03 DIAGNOSIS — M542 Cervicalgia: Secondary | ICD-10-CM | POA: Diagnosis not present

## 2018-08-03 DIAGNOSIS — F4312 Post-traumatic stress disorder, chronic: Secondary | ICD-10-CM | POA: Diagnosis not present

## 2018-08-03 DIAGNOSIS — M5126 Other intervertebral disc displacement, lumbar region: Secondary | ICD-10-CM | POA: Diagnosis not present

## 2018-08-03 DIAGNOSIS — M25521 Pain in right elbow: Secondary | ICD-10-CM | POA: Diagnosis not present

## 2018-08-11 DIAGNOSIS — M25521 Pain in right elbow: Secondary | ICD-10-CM | POA: Diagnosis not present

## 2018-08-11 DIAGNOSIS — F4312 Post-traumatic stress disorder, chronic: Secondary | ICD-10-CM | POA: Diagnosis not present

## 2018-08-11 DIAGNOSIS — M5126 Other intervertebral disc displacement, lumbar region: Secondary | ICD-10-CM | POA: Diagnosis not present

## 2018-08-11 DIAGNOSIS — M542 Cervicalgia: Secondary | ICD-10-CM | POA: Diagnosis not present

## 2018-08-12 DIAGNOSIS — R635 Abnormal weight gain: Secondary | ICD-10-CM | POA: Diagnosis not present

## 2018-08-12 DIAGNOSIS — Z01419 Encounter for gynecological examination (general) (routine) without abnormal findings: Secondary | ICD-10-CM | POA: Diagnosis not present

## 2018-08-12 DIAGNOSIS — F430 Acute stress reaction: Secondary | ICD-10-CM | POA: Diagnosis not present

## 2018-08-12 DIAGNOSIS — Z130 Encounter for screening for diseases of the blood and blood-forming organs and certain disorders involving the immune mechanism: Secondary | ICD-10-CM | POA: Diagnosis not present

## 2018-08-12 DIAGNOSIS — Z1231 Encounter for screening mammogram for malignant neoplasm of breast: Secondary | ICD-10-CM | POA: Diagnosis not present

## 2018-08-12 DIAGNOSIS — Z6825 Body mass index (BMI) 25.0-25.9, adult: Secondary | ICD-10-CM | POA: Diagnosis not present

## 2018-08-12 DIAGNOSIS — Z13228 Encounter for screening for other metabolic disorders: Secondary | ICD-10-CM | POA: Diagnosis not present

## 2018-08-12 DIAGNOSIS — R5383 Other fatigue: Secondary | ICD-10-CM | POA: Diagnosis not present

## 2018-08-12 DIAGNOSIS — M816 Localized osteoporosis [Lequesne]: Secondary | ICD-10-CM | POA: Diagnosis not present

## 2018-08-12 DIAGNOSIS — Z13820 Encounter for screening for osteoporosis: Secondary | ICD-10-CM | POA: Diagnosis not present

## 2018-08-12 DIAGNOSIS — Z1329 Encounter for screening for other suspected endocrine disorder: Secondary | ICD-10-CM | POA: Diagnosis not present

## 2018-08-23 ENCOUNTER — Other Ambulatory Visit: Payer: Self-pay | Admitting: Cardiology

## 2018-08-25 IMAGING — DX DG LUMBAR SPINE COMPLETE 4+V
5 series · 5 of 5 positions shown · non-contrast
Comparison: Coronal and sagittal images through the lumbar spine
from an abdominal and pelvic CT scan dated November 22, 2009.

CLINICAL DATA: Low back pain radiating up into the right side for
the past month with no known injuries.

EXAM:
LUMBAR SPINE - COMPLETE 4+ VIEW

[l-spine ap]
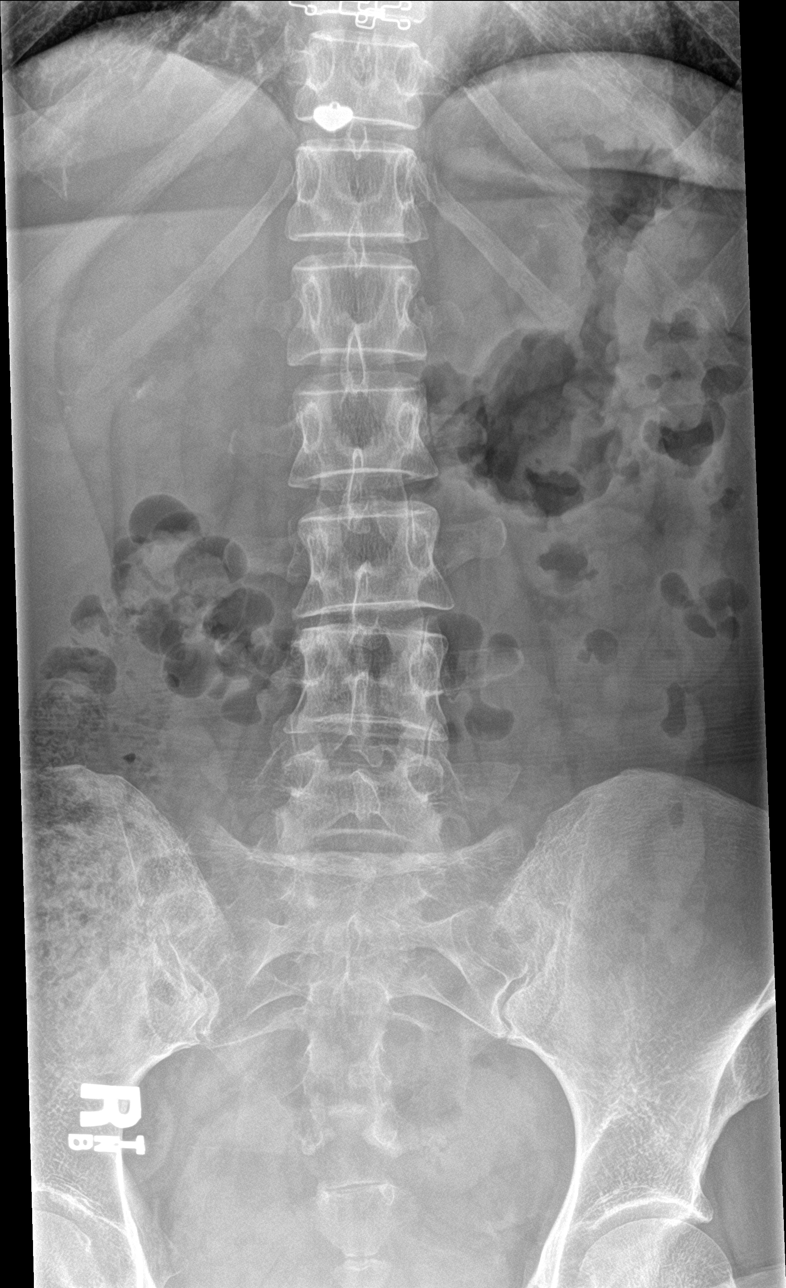

[l-spine obl (1 of 2)]
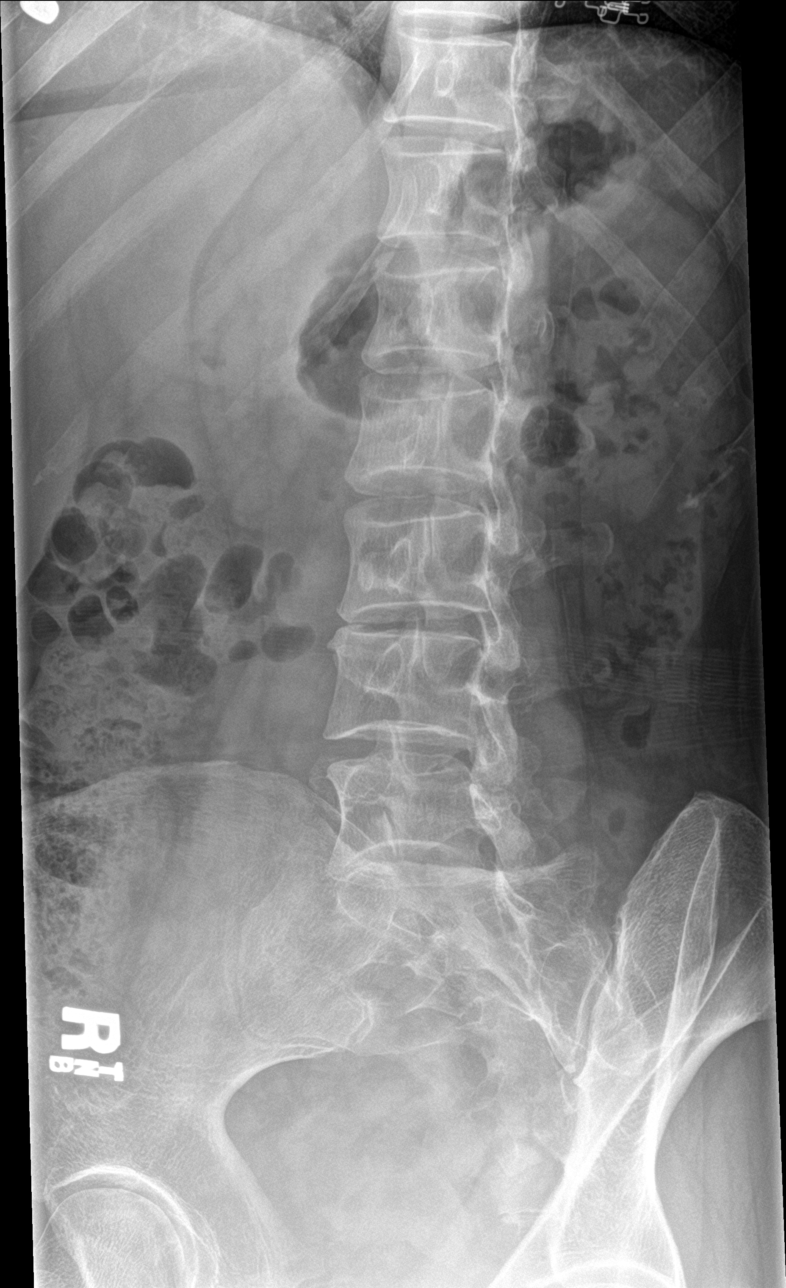

[l-spine obl (2 of 2)]
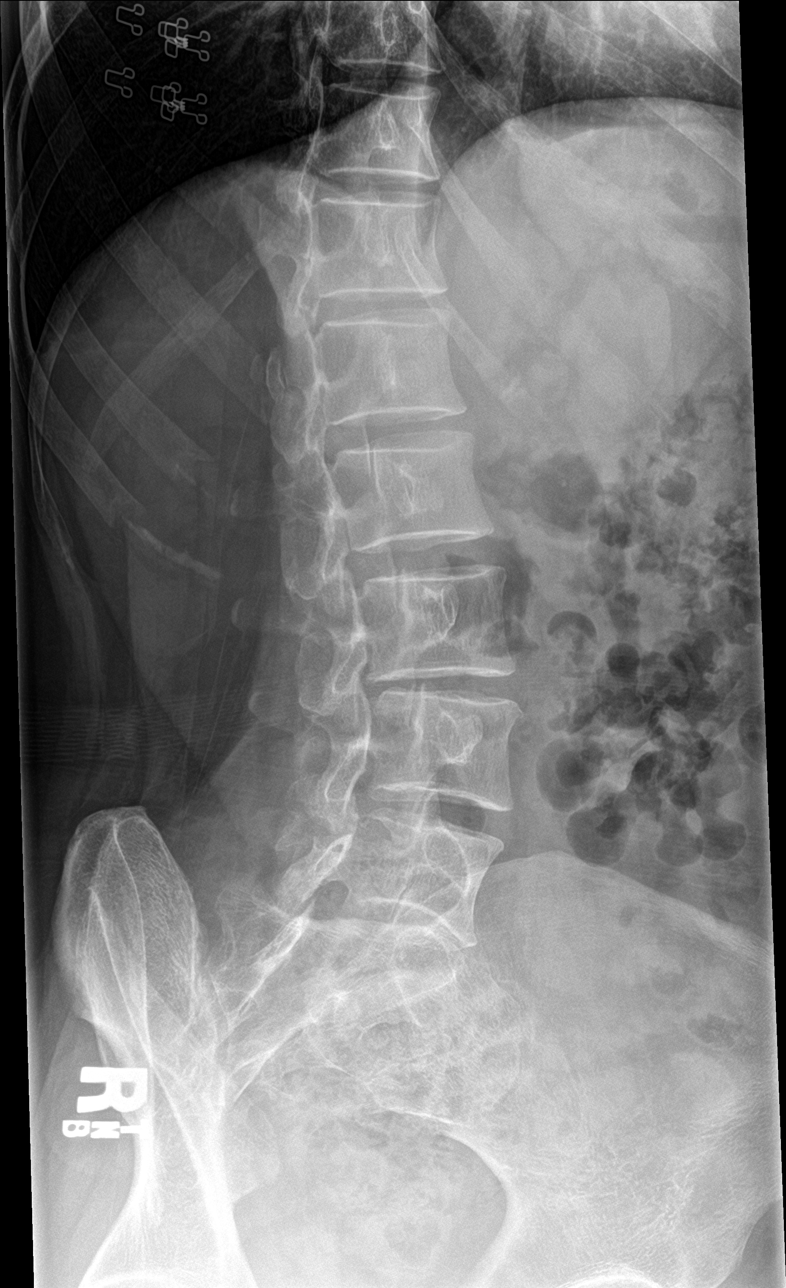

[l-spine lat]
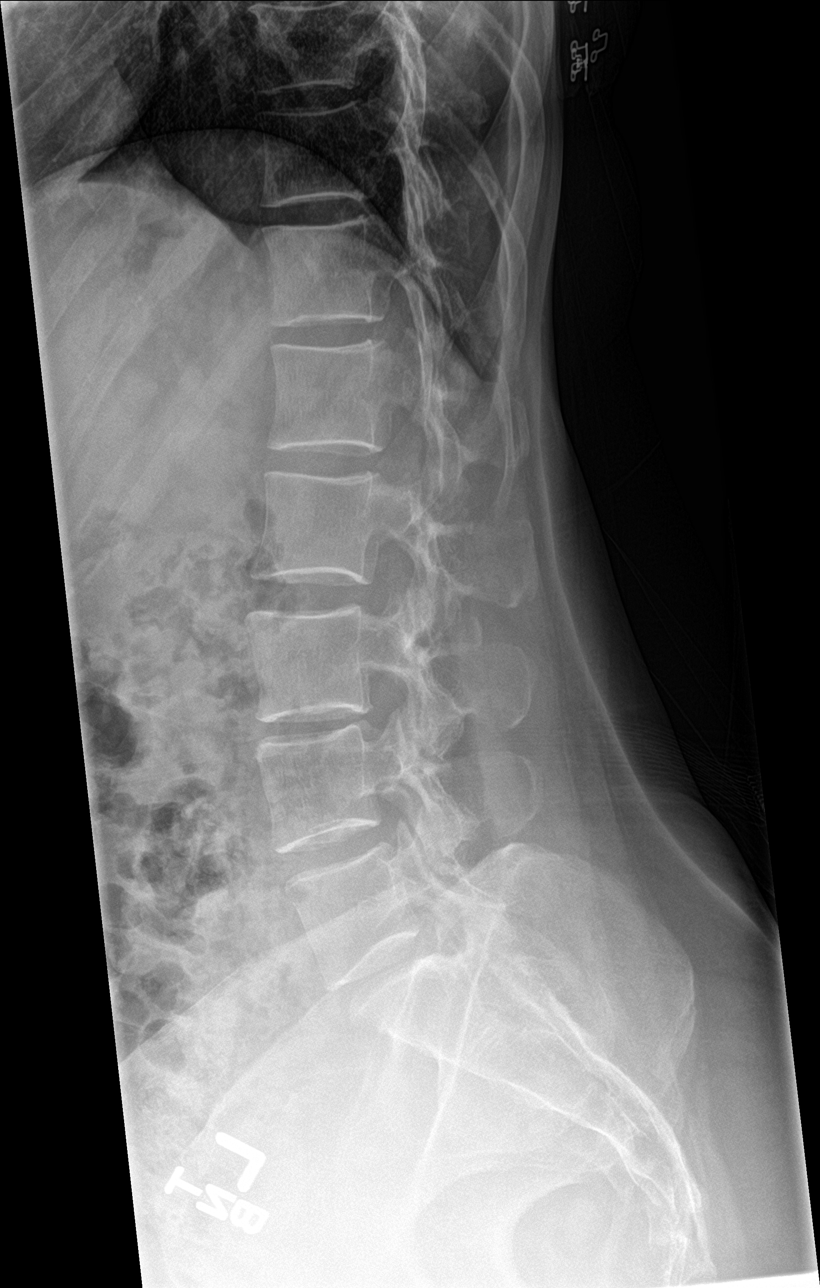

[l-spine spot]
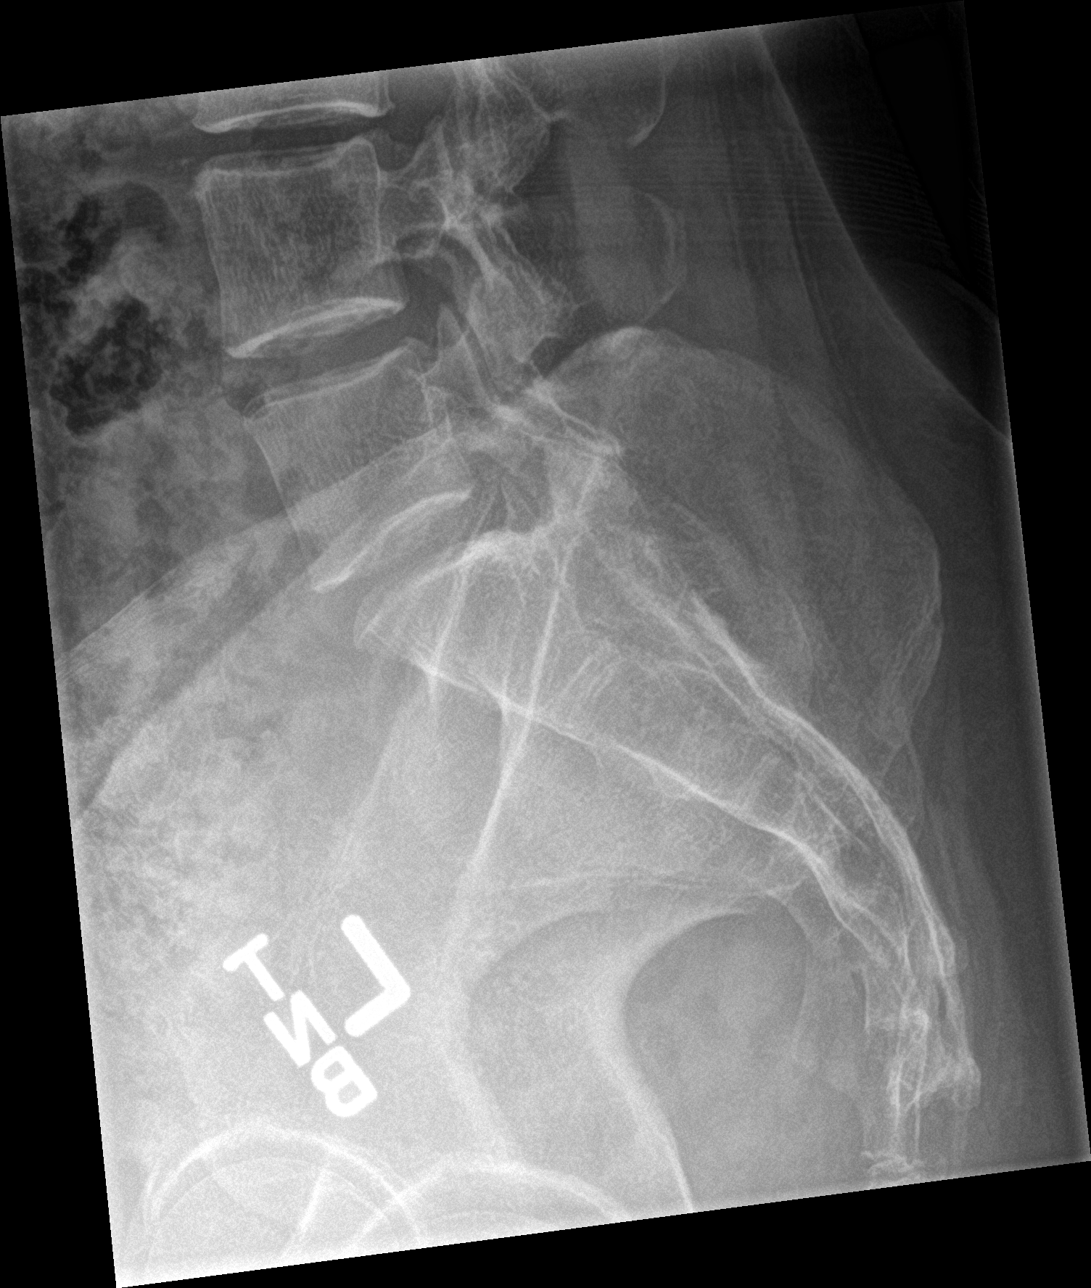

[5 of 5 positions shown; findings below may reference images not displayed]

FINDINGS: The vertebral bodies are preserved in height. The disc space heights
are well maintained with exception of L3-4 where there is mild
narrowing. There is no spondylolisthesis. There is no significant
facet joint hypertrophy. The pedicles and transverse processes are
intact. The observed portions of the sacrum are normal.

The bowel gas pattern is normal where visualized. No urinary tract
stones are observed. Calcification projecting over the right kidney
on the frontal view does not follow the kidney on the oblique views.
IMPRESSION: Degenerative disc disease centered at L3-4. No compression fracture
nor other acute bony abnormality.

## 2018-09-09 DIAGNOSIS — F4312 Post-traumatic stress disorder, chronic: Secondary | ICD-10-CM | POA: Diagnosis not present

## 2018-09-09 DIAGNOSIS — M5126 Other intervertebral disc displacement, lumbar region: Secondary | ICD-10-CM | POA: Diagnosis not present

## 2018-09-09 DIAGNOSIS — M25521 Pain in right elbow: Secondary | ICD-10-CM | POA: Diagnosis not present

## 2018-09-09 DIAGNOSIS — M542 Cervicalgia: Secondary | ICD-10-CM | POA: Diagnosis not present

## 2018-09-16 DIAGNOSIS — F4312 Post-traumatic stress disorder, chronic: Secondary | ICD-10-CM | POA: Diagnosis not present

## 2018-09-16 DIAGNOSIS — M5126 Other intervertebral disc displacement, lumbar region: Secondary | ICD-10-CM | POA: Diagnosis not present

## 2018-09-16 DIAGNOSIS — M25521 Pain in right elbow: Secondary | ICD-10-CM | POA: Diagnosis not present

## 2018-09-16 DIAGNOSIS — M542 Cervicalgia: Secondary | ICD-10-CM | POA: Diagnosis not present

## 2018-09-21 DIAGNOSIS — M542 Cervicalgia: Secondary | ICD-10-CM | POA: Diagnosis not present

## 2018-09-21 DIAGNOSIS — F4312 Post-traumatic stress disorder, chronic: Secondary | ICD-10-CM | POA: Diagnosis not present

## 2018-09-21 DIAGNOSIS — M25521 Pain in right elbow: Secondary | ICD-10-CM | POA: Diagnosis not present

## 2018-09-21 DIAGNOSIS — M5126 Other intervertebral disc displacement, lumbar region: Secondary | ICD-10-CM | POA: Diagnosis not present

## 2018-09-27 DIAGNOSIS — M5126 Other intervertebral disc displacement, lumbar region: Secondary | ICD-10-CM | POA: Diagnosis not present

## 2018-09-27 DIAGNOSIS — F4312 Post-traumatic stress disorder, chronic: Secondary | ICD-10-CM | POA: Diagnosis not present

## 2018-09-27 DIAGNOSIS — M25521 Pain in right elbow: Secondary | ICD-10-CM | POA: Diagnosis not present

## 2018-09-27 DIAGNOSIS — M542 Cervicalgia: Secondary | ICD-10-CM | POA: Diagnosis not present

## 2018-09-28 ENCOUNTER — Other Ambulatory Visit: Payer: Self-pay | Admitting: Cardiology

## 2018-10-12 DIAGNOSIS — F4312 Post-traumatic stress disorder, chronic: Secondary | ICD-10-CM | POA: Diagnosis not present

## 2018-10-12 DIAGNOSIS — M542 Cervicalgia: Secondary | ICD-10-CM | POA: Diagnosis not present

## 2018-10-12 DIAGNOSIS — M5126 Other intervertebral disc displacement, lumbar region: Secondary | ICD-10-CM | POA: Diagnosis not present

## 2018-10-12 DIAGNOSIS — M25521 Pain in right elbow: Secondary | ICD-10-CM | POA: Diagnosis not present

## 2018-10-26 DIAGNOSIS — F4312 Post-traumatic stress disorder, chronic: Secondary | ICD-10-CM | POA: Diagnosis not present

## 2018-10-26 DIAGNOSIS — M542 Cervicalgia: Secondary | ICD-10-CM | POA: Diagnosis not present

## 2018-10-26 DIAGNOSIS — M5126 Other intervertebral disc displacement, lumbar region: Secondary | ICD-10-CM | POA: Diagnosis not present

## 2018-10-26 DIAGNOSIS — M25521 Pain in right elbow: Secondary | ICD-10-CM | POA: Diagnosis not present

## 2018-11-02 DIAGNOSIS — M5126 Other intervertebral disc displacement, lumbar region: Secondary | ICD-10-CM | POA: Diagnosis not present

## 2018-11-02 DIAGNOSIS — F4312 Post-traumatic stress disorder, chronic: Secondary | ICD-10-CM | POA: Diagnosis not present

## 2018-11-02 DIAGNOSIS — M25521 Pain in right elbow: Secondary | ICD-10-CM | POA: Diagnosis not present

## 2018-11-02 DIAGNOSIS — M542 Cervicalgia: Secondary | ICD-10-CM | POA: Diagnosis not present

## 2018-11-08 ENCOUNTER — Other Ambulatory Visit: Payer: Self-pay | Admitting: Cardiology

## 2018-11-09 DIAGNOSIS — M5126 Other intervertebral disc displacement, lumbar region: Secondary | ICD-10-CM | POA: Diagnosis not present

## 2018-11-09 DIAGNOSIS — M25521 Pain in right elbow: Secondary | ICD-10-CM | POA: Diagnosis not present

## 2018-11-09 DIAGNOSIS — M542 Cervicalgia: Secondary | ICD-10-CM | POA: Diagnosis not present

## 2018-11-09 DIAGNOSIS — F4312 Post-traumatic stress disorder, chronic: Secondary | ICD-10-CM | POA: Diagnosis not present

## 2018-11-12 ENCOUNTER — Ambulatory Visit: Payer: BLUE CROSS/BLUE SHIELD | Admitting: Family Medicine

## 2018-11-12 VITALS — Temp 99.4°F | Wt 144.1 lb

## 2018-11-12 DIAGNOSIS — J019 Acute sinusitis, unspecified: Secondary | ICD-10-CM

## 2018-11-12 DIAGNOSIS — J111 Influenza due to unidentified influenza virus with other respiratory manifestations: Secondary | ICD-10-CM

## 2018-11-12 MED ORDER — CEFDINIR 300 MG PO CAPS: 300.0000 mg | ORAL_CAPSULE | Freq: Two times a day (BID) | ORAL | 0 refills | Status: DC

## 2018-11-12 MED ORDER — BENZONATATE 100 MG PO CAPS
100.0000 mg | ORAL_CAPSULE | Freq: Four times a day (QID) | ORAL | 0 refills | Status: DC | PRN
Start: 2018-11-12 — End: 2019-06-28

## 2018-11-12 MED ORDER — DILTIAZEM HCL 30 MG PO TABS
ORAL_TABLET | ORAL | 1 refills | Status: DC
Start: 2018-11-12 — End: 2019-03-30

## 2018-11-12 MED ORDER — OSELTAMIVIR PHOSPHATE 75 MG PO CAPS: 75.0000 mg | ORAL_CAPSULE | Freq: Two times a day (BID) | ORAL | 0 refills | Status: AC

## 2018-11-12 MED ORDER — ALPRAZOLAM 0.5 MG PO TABS: ORAL_TABLET | ORAL | 1 refills | Status: DC

## 2018-11-12 MED ORDER — HYDROCODONE-HOMATROPINE 5-1.5 MG/5ML PO SYRP: 5.0000 mL | ORAL_SOLUTION | Freq: Four times a day (QID) | ORAL | 0 refills | Status: AC | PRN

## 2018-11-12 MED ORDER — OSELTAMIVIR PHOSPHATE 75 MG PO CAPS: 75.0000 mg | ORAL_CAPSULE | Freq: Two times a day (BID) | ORAL | 0 refills | Status: DC

## 2018-11-12 NOTE — Progress Notes (Signed)
   Subjective:    Patient ID: Victoria [name], female    DOB: [birth year], 58 y.o.   MRN: [id number]  HPI  Patient is here today with complaints of a productive cough and congestion, headache,sore throat,runny nose,fever,back pain from herniated disc. Significant cough congestion not feeling good body aches her son had similar illness that she saw just about a day or so ago denies wheezing difficulty breathing The cold symptoms started yesterday.   She has been taking aleve for the last month.Hydrocodone Qhs prn.  Review of Systems  Constitutional: Negative for activity change and fever.  HENT: Positive for congestion and rhinorrhea. Negative for ear pain.   Eyes: Negative for discharge.  Respiratory: Positive for cough. Negative for shortness of breath and wheezing.   Cardiovascular: Negative for chest pain.       Objective:   Physical Exam Vitals signs and nursing note reviewed.  Constitutional:      Appearance: She is well-developed.  HENT:     Head: Normocephalic.     Nose: Nose normal.     Mouth/Throat:     Pharynx: No oropharyngeal exudate.  Neck:     Musculoskeletal: Neck supple.  Cardiovascular:     Rate and Rhythm: Normal rate.     Heart sounds: Normal heart sounds. No murmur.  Pulmonary:     Effort: Pulmonary effort is normal.     Breath sounds: Normal breath sounds. No wheezing.  Lymphadenopathy:     Cervical: No cervical adenopathy.  Skin:    General: Skin is warm and dry.     Patient also complains of sinus pressure pain discomfort has a frequent history of sinusitis      Assessment & Plan:  Influenza-the patient was diagnosed with influenza. Patient/family educated about the flu and warning signs to watch for. If difficulty breathing,  cyanosis, disorientation, or progressive worsening then immediately get rechecked at the ER. If progressive symptoms be certain to be rechecked. Supportive measures such as Tylenol/ibuprofen was discussed. No aspirin use in  children. Also controlled cough medicine sent in for home use only caution drowsiness  Refill of her Xanax and diltiazem was given with encouragement to follow-up within the next 60 days  Omnicef twice daily for sinusitis

## 2018-11-16 DIAGNOSIS — M5126 Other intervertebral disc displacement, lumbar region: Secondary | ICD-10-CM | POA: Diagnosis not present

## 2018-11-16 DIAGNOSIS — M542 Cervicalgia: Secondary | ICD-10-CM | POA: Diagnosis not present

## 2018-11-16 DIAGNOSIS — M25521 Pain in right elbow: Secondary | ICD-10-CM | POA: Diagnosis not present

## 2018-11-16 DIAGNOSIS — F4312 Post-traumatic stress disorder, chronic: Secondary | ICD-10-CM | POA: Diagnosis not present

## 2018-11-22 ENCOUNTER — Telehealth: Payer: Self-pay | Admitting: *Deleted

## 2018-11-22 ENCOUNTER — Telehealth: Payer: Self-pay | Admitting: Family Medicine

## 2018-11-22 MED ORDER — CEFDINIR 300 MG PO CAPS: 300.0000 mg | ORAL_CAPSULE | Freq: Two times a day (BID) | ORAL | 0 refills | Status: DC

## 2018-11-22 MED ORDER — AZITHROMYCIN 250 MG PO TABS: ORAL_TABLET | ORAL | 0 refills | Status: DC

## 2018-11-22 NOTE — Telephone Encounter (Signed)
Patient states she had the Flu approx 1-2 weeks ago, and patient is still experiencing head cold/congestion, and has mouth ulcer that is puffy and white. Advise.   Pharmacy:  [facility] [phone] - [place], [place] - [address] AT [facility] [address]

## 2018-11-22 NOTE — Telephone Encounter (Signed)
Prescription sent electronically to pharmacy. Patient notified. 

## 2018-11-22 NOTE — Telephone Encounter (Signed)
Patient states she just finished omnicef 300mg  last night and it didn't help so she doesn't want to take more of the same medicine that dint help to start with . She was wondering if a z pack would be better

## 2018-11-22 NOTE — Telephone Encounter (Signed)
zpk inform pt zpk you take for five d but lasts in the bloodstream 10 d at therapeutic doses

## 2018-11-22 NOTE — Telephone Encounter (Signed)
omnicef 300 bid ten d 

## 2018-11-23 ENCOUNTER — Other Ambulatory Visit: Payer: Self-pay | Admitting: Family Medicine

## 2018-11-23 ENCOUNTER — Telehealth: Payer: Self-pay | Admitting: Family Medicine

## 2018-11-23 MED ORDER — TERCONAZOLE 0.4 % VA CREA: 1.0000 | TOPICAL_CREAM | Freq: Every day | VAGINAL | 0 refills | Status: DC

## 2018-11-23 NOTE — Telephone Encounter (Signed)
Does she typically do the 3 day course or 7 days?  Thanks

## 2018-11-23 NOTE — Telephone Encounter (Signed)
Pt is aware and verbalized understanding.

## 2018-11-23 NOTE — Telephone Encounter (Signed)
Just started 2nd round of antibiotics today. Having vaginal itching and white discharge. Diflucan has never worked for her. She usually has to get terazol cream. Originally prescribed by obgyn but states dr Brett Canales has sent in for her in the past.

## 2018-11-23 NOTE — Telephone Encounter (Signed)
Pt would like terracina cream for yeast infection called in applicator form. She just started her second round of antibiotics.   Please send to  [id number] - [place], [place] - [address] AT [facility] [address]

## 2018-11-23 NOTE — Telephone Encounter (Signed)
7 day course of terazol cream applicators called in to preferred pharmacy. If she should have continued or worsening symptoms of vaginal discharge or pelvic pain she needs to be seen in office for f/u. Thanks

## 2018-11-23 NOTE — Telephone Encounter (Signed)
Pt states Diflucan has not ever worked for her and she would like the Terazol cream in applicator form called in. Please advise. Thank you

## 2018-11-30 ENCOUNTER — Other Ambulatory Visit: Payer: Self-pay | Admitting: Family Medicine

## 2018-11-30 NOTE — Telephone Encounter (Signed)
Ok plus 5 monthly ref 

## 2019-02-17 ENCOUNTER — Other Ambulatory Visit: Payer: Self-pay | Admitting: Family Medicine

## 2019-02-17 NOTE — Telephone Encounter (Signed)
Will review risks and benefits of medication tomorrow before continuing.

## 2019-02-17 NOTE — Telephone Encounter (Signed)
Please contact patient and set up appt. Then route to nurses. Thank you

## 2019-02-17 NOTE — Telephone Encounter (Signed)
Pt has appt with Hoyle Sauer tomorrow. Please advise. Thank you

## 2019-02-17 NOTE — Telephone Encounter (Signed)
Pt has appt set for tomorrow with Hoyle Sauer

## 2019-02-18 ENCOUNTER — Ambulatory Visit: Payer: BLUE CROSS/BLUE SHIELD | Admitting: Nurse Practitioner

## 2019-02-23 ENCOUNTER — Telehealth: Payer: Self-pay | Admitting: Family Medicine

## 2019-02-23 NOTE — Telephone Encounter (Signed)
patient is requesting a prescription for neb machine kit  She states all her tubing and mouth piece are old and broken. [employer]

## 2019-02-23 NOTE — Telephone Encounter (Signed)
Left message to return call 

## 2019-02-23 NOTE — Telephone Encounter (Signed)
ok 

## 2019-02-23 NOTE — Telephone Encounter (Signed)
rx wrote and ready for signature at nurse station if you agree.

## 2019-02-23 NOTE — Telephone Encounter (Signed)
Pt.notified

## 2019-03-30 ENCOUNTER — Other Ambulatory Visit: Payer: Self-pay | Admitting: Family Medicine

## 2019-03-30 NOTE — Telephone Encounter (Signed)
Three mo worth 

## 2019-04-28 ENCOUNTER — Telehealth: Payer: Self-pay | Admitting: *Deleted

## 2019-04-28 MED ORDER — DILTIAZEM HCL 30 MG PO TABS: 30.0000 mg | ORAL_TABLET | Freq: Three times a day (TID) | ORAL | 0 refills | Status: DC

## 2019-04-28 NOTE — Telephone Encounter (Signed)
Patient verbally consented for tele-health visits with [facility] and understands that her insurance company will be billed for the encounter.  Aware to have vitals available   

## 2019-05-05 ENCOUNTER — Ambulatory Visit (INDEPENDENT_AMBULATORY_CARE_PROVIDER_SITE_OTHER): Payer: BC Managed Care – PPO | Admitting: Otolaryngology

## 2019-05-05 DIAGNOSIS — R42 Dizziness and giddiness: Secondary | ICD-10-CM | POA: Diagnosis not present

## 2019-05-12 ENCOUNTER — Ambulatory Visit (INDEPENDENT_AMBULATORY_CARE_PROVIDER_SITE_OTHER): Payer: BC Managed Care – PPO | Admitting: Otolaryngology

## 2019-05-12 ENCOUNTER — Other Ambulatory Visit: Payer: Self-pay

## 2019-05-12 DIAGNOSIS — R42 Dizziness and giddiness: Secondary | ICD-10-CM

## 2019-05-18 ENCOUNTER — Telehealth: Payer: BC Managed Care – PPO | Admitting: Cardiovascular Disease

## 2019-06-09 ENCOUNTER — Encounter

## 2019-06-18 ENCOUNTER — Other Ambulatory Visit: Payer: Self-pay | Admitting: Family Medicine

## 2019-06-20 NOTE — Telephone Encounter (Signed)
Contact pt to set up appt; then may route to nurses. Thank you 

## 2019-06-21 ENCOUNTER — Telehealth: Payer: BLUE CROSS/BLUE SHIELD | Admitting: Cardiovascular Disease

## 2019-06-23 ENCOUNTER — Telehealth: Payer: Self-pay | Admitting: *Deleted

## 2019-06-23 NOTE — Telephone Encounter (Signed)
appt scheduled for 06/29/2019- please refill med, pt aware we will refill

## 2019-06-23 NOTE — Telephone Encounter (Signed)
Called, LVM asking if pt can come at 9am on 06/27/19 instead of 10:30am. Dr. Felecia Shelling has a meeting and asked if she could come earlier

## 2019-06-23 NOTE — Telephone Encounter (Addendum)
Pt called back to r/s her appointment.  Phone rep reached out to RN Terrence Dupont and was given various dates and times to offer pt to try and get her in earlier.  Pt declined them due to conflicts with other appointments, and the conflict of one being the day before a major holiday in November.  Pt chose 09-22-19 and states she will call back if she has a need to require her to change the date now confirmed .  This is FYI no call back requested.

## 2019-06-23 NOTE — Telephone Encounter (Signed)
Ref times ione

## 2019-06-27 ENCOUNTER — Institutional Professional Consult (permissible substitution): Payer: BC Managed Care – PPO | Admitting: Neurology

## 2019-06-28 ENCOUNTER — Encounter: Payer: Self-pay | Admitting: Cardiovascular Disease

## 2019-06-28 ENCOUNTER — Telehealth (INDEPENDENT_AMBULATORY_CARE_PROVIDER_SITE_OTHER): Payer: BC Managed Care – PPO | Admitting: Cardiovascular Disease

## 2019-06-28 VITALS — BP 126/82 | HR 108 | Ht 64.0 in | Wt 150.0 lb

## 2019-06-28 DIAGNOSIS — R002 Palpitations: Secondary | ICD-10-CM | POA: Diagnosis not present

## 2019-06-28 DIAGNOSIS — R000 Tachycardia, unspecified: Secondary | ICD-10-CM

## 2019-06-28 NOTE — Patient Instructions (Signed)
Medication Instructions:  Continue all current medications.  Labwork: none  Testing/Procedures: none  Follow-Up: 3 months   Any Other Special Instructions Will Be Listed Below (If Applicable).  If you need a refill on your cardiac medications before your next appointment, please call your pharmacy.  

## 2019-06-28 NOTE — Progress Notes (Signed)
Virtual Visit via Telephone Note   This visit type was conducted due to national recommendations for restrictions regarding the COVID-19 Pandemic (e.g. social distancing) in an effort to limit this patient's exposure and mitigate transmission in our community.  Due to her co-morbid illnesses, this patient is at least at moderate risk for complications without adequate follow up.  This format is felt to be most appropriate for this patient at this time.  The patient did not have access to video technology/had technical difficulties with video requiring transitioning to audio format only (telephone).  All issues noted in this document were discussed and addressed.  No physical exam could be performed with this format.  Please refer to the patient's chart for her  consent to telehealth for [facility].   Date:  06/28/2019   ID:  Victoria MourningJan M [name], DOB 1961/03/31, MRN [id number]  Patient Location: Home Provider Location: Office  PCP:  Merlyn AlbertLuking, William S, MD  Cardiologist:  Prentice DockerSuresh Kelleigh Skerritt, MD  Electrophysiologist:  None   Evaluation Performed:  Follow-Up Visit  Chief Complaint:  Palpitations  History of Present Illness:    Victoria [name] is a 58 y.o. female with palpitations.  Her daughter, [name], got married in May.  Echocardiogram 08/04/16: Normal left ventricular systolic function and regional wall motion, LVEF 55-60%, grade 1 diastolic dysfunction.  Cardiac monitoring in December 2017 showed sinus rhythm, sinus tachycardia, and occasional PVCs. Average heart rate was 90 bpm. She had symptoms with sinus rhythm, sinus tachycardia, and PVCs.  She has had BP fluctuations with "spikes". She had an episode of near syncope which she attributes to high BP (160/90).  HR has been elevated in the low 100 bpm range.  She has some anxiety related to a dog attack a couple of years ago.  She denies chest pain, edema, and shortness of breath. She has been experiencing hot flashes.  She is  scared of new medications but is wondering about long-acting diltiazem.  Soc Hx: Married. Husband owns used Programme researcher, broadcasting/film/videocar dealership, former Print production plannerbaseball player for [employer]. Son is a Teacher, early years/prepharmacist. Daughter works in Teacher, musichealthcare in [place].   Past Medical History:  Diagnosis Date  . Asthma   . GERD (gastroesophageal reflux disease)   . GERD (gastroesophageal reflux disease)   . Headache   . Hypertension   . Scoliosis   . Vertigo    Past Surgical History:  Procedure Laterality Date  . COLONOSCOPY       Current Meds  Medication Sig  . ALPRAZolam (XANAX) 0.5 MG tablet TAKE 1 TABLET EVERY MORNING AS NEEDED FOR ANXIETY AND 1 TABLET AT BEDTIME FOR INSOMNIA  . Calcium Carb-Cholecalciferol (CALCIUM 1000 + D PO) Take by mouth 2 (two) times daily.  . cyclobenzaprine (FLEXERIL) 10 MG tablet Take 1 tablet (10 mg total) by mouth daily as needed for muscle spasms.  . DEXILANT 60 MG capsule TK 1 C PO QD  . dicyclomine (BENTYL) 10 MG capsule TAKE 1 CAPSULE(10 MG) BY MOUTH THREE TIMES DAILY  . diltiazem (CARDIZEM) 30 MG tablet Take 1 tablet (30 mg total) by mouth 3 (three) times daily.  . fexofenadine (ALLEGRA) 180 MG tablet Take 180 mg by mouth daily.  . fluticasone (FLONASE) 50 MCG/ACT nasal spray Place 2 sprays into both nostrils daily.  Marland Kitchen. HYDROcodone-acetaminophen (NORCO/VICODIN) 5-325 MG tablet Take one every 4 - 6 hours prn pain  . levalbuterol (XOPENEX HFA) 45 MCG/ACT inhaler Inhale 2 puffs into the lungs every 6 (six) hours as needed for wheezing.  .Marland Kitchen  Magnesium 400 MG TABS Take 1 tablet by mouth daily.  . metroNIDAZOLE (METROGEL) 1 % gel APPLY TO FACE EVERY NIGHT AT BEDTIME AS NEEDED  . mometasone (NASONEX) 50 MCG/ACT nasal spray SHAKE LQ AND U 2 SPRAYS IEN QD  . montelukast (SINGULAIR) 10 MG tablet Take 1 tablet (10 mg total) by mouth at bedtime.  . Multiple Vitamin (MULTIVITAMIN) tablet Take 1 tablet by mouth daily.  . polyethylene glycol (MIRALAX / GLYCOLAX) packet Take 17 g by mouth daily.  Marland Kitchen  PREVIDENT 5000 SENSITIVE 1.1-5 % PSTE   . Probiotic Product (RESTORA PO) Take by mouth.  . psyllium (METAMUCIL) 58.6 % powder Take 1 packet by mouth daily.   . RESTASIS 0.05 % ophthalmic emulsion INSTILL 1 DROP IN EACH EYE Q 12 H     Allergies:   Patient has no known allergies.   Social History   Tobacco Use  . Smoking status: Never Smoker  . Smokeless tobacco: Never Used  Substance Use Topics  . Alcohol use: No    Alcohol/week: 0.0 standard drinks  . Drug use: No     Family Hx: The patient's family history includes Heart attack in her father; Stroke in her father.  ROS:   Please see the history of present illness.     All other systems reviewed and are negative.   Prior CV studies:   The following studies were reviewed today:  Reviewed above  Labs/Other Tests and Data Reviewed:    EKG:  No ECG reviewed.  Recent Labs: No results found for requested labs within last 8760 hours.   Recent Lipid Panel Lab Results  Component Value Date/Time   CHOL 171 07/25/2016 11:06 AM   TRIG 123 07/25/2016 11:06 AM   HDL 63 07/25/2016 11:06 AM   CHOLHDL 2.7 07/25/2016 11:06 AM   LDLCALC 83 07/25/2016 11:06 AM    Wt Readings from Last 3 Encounters:  06/28/19 150 lb (68 kg)  11/12/18 144 lb 0.8 oz (65.3 kg)  06/09/18 145 lb 3.2 oz (65.9 kg)     Objective:    Vital Signs:  BP 126/82   Pulse (!) 108   Ht 5\' 4"  (1.626 m)   Wt 150 lb (68 kg)   BMI 25.75 kg/m    VITAL SIGNS:  reviewed  ASSESSMENT & PLAN:    1. Palpitations: She has a prior history of PSVT. Currently on diltiazem 30 mg three times daily.  She has had issues with blood pressure and heart rate fluctuations.  Heart rate generally stays in the low 100 bpm range.  She had questions about switching to Cardizem CD.  We talked about this at length.  For the time being she will think about it.  No changes to therapy for now.    COVID-19 Education: The signs and symptoms of COVID-19 were discussed with the  patient and how to seek care for testing (follow up with PCP or arrange E-visit).  The importance of social distancing was discussed today.  Time:   Today, I have spent 17 minutes with the patient with telehealth technology discussing the above problems.     Medication Adjustments/Labs and Tests Ordered: Current medicines are reviewed at length with the patient today.  Concerns regarding medicines are outlined above.   Tests Ordered: No orders of the defined types were placed in this encounter.   Medication Changes: No orders of the defined types were placed in this encounter.   Follow Up:  Either In Person or Virtual in  3 month(s)  Signed, Kate Sable, MD  06/28/2019 9:53 AM    [facility]

## 2019-06-29 ENCOUNTER — Ambulatory Visit (INDEPENDENT_AMBULATORY_CARE_PROVIDER_SITE_OTHER): Payer: BC Managed Care – PPO | Admitting: Family Medicine

## 2019-06-29 ENCOUNTER — Other Ambulatory Visit: Payer: Self-pay

## 2019-06-29 DIAGNOSIS — J45909 Unspecified asthma, uncomplicated: Secondary | ICD-10-CM

## 2019-06-29 DIAGNOSIS — F411 Generalized anxiety disorder: Secondary | ICD-10-CM | POA: Diagnosis not present

## 2019-06-29 DIAGNOSIS — K219 Gastro-esophageal reflux disease without esophagitis: Secondary | ICD-10-CM | POA: Diagnosis not present

## 2019-06-29 DIAGNOSIS — I10 Essential (primary) hypertension: Secondary | ICD-10-CM | POA: Diagnosis not present

## 2019-06-29 MED ORDER — DEXILANT 60 MG PO CPDR: DELAYED_RELEASE_CAPSULE | ORAL | 5 refills | Status: DC

## 2019-06-29 MED ORDER — LEVALBUTEROL TARTRATE 45 MCG/ACT IN AERO: 2.0000 | INHALATION_SPRAY | Freq: Four times a day (QID) | RESPIRATORY_TRACT | 5 refills | Status: DC | PRN

## 2019-06-29 MED ORDER — FLUTICASONE PROPIONATE 50 MCG/ACT NA SUSP: 2.0000 | Freq: Every day | NASAL | 5 refills | Status: DC

## 2019-06-29 MED ORDER — MONTELUKAST SODIUM 10 MG PO TABS: 10.0000 mg | ORAL_TABLET | Freq: Every day | ORAL | 5 refills | Status: DC

## 2019-06-29 MED ORDER — METRONIDAZOLE 1 % EX GEL: CUTANEOUS | 5 refills | Status: DC

## 2019-06-29 MED ORDER — ALPRAZOLAM 0.5 MG PO TABS: ORAL_TABLET | ORAL | 5 refills | Status: DC

## 2019-06-29 NOTE — Progress Notes (Signed)
   Subjective:  Audio only  Patient calls with numerous concerns  Patient ID: Victoria [name], female    DOB: 08/22/1961, 58 y.o.   MRN: [id number]  Hypertension This is a chronic problem. The current episode started more than 1 year ago. Risk factors for coronary artery disease include post-menopausal state. Treatments tried: cardizem. There are no compliance problems.    Patient would also like to get a prescription for restasis(normally gets thru eye dr)   Review of Systems Virtual Visit via Video Note  I connected with Victoria [name] on 06/29/19 at  1:40 PM EDT by a video enabled telemedicine application and verified that I am speaking with the correct person using two identifiers.  Location: Patient: home Provider: office   I discussed the limitations of evaluation and management by telemedicine and the availability of in person appointments. The patient expressed understanding and agreed to proceed.  History of Present Illness:    Observations/Objective:   Assessment and Plan:   Follow Up Instructions:    I discussed the assessment and treatment plan with the patient. The patient was provided an opportunity to ask questions and all were answered. The patient agreed with the plan and demonstrated an understanding of the instructions.   The patient was advised to call back or seek an in-person evaluation if the symptoms worsen or if the condition fails to improve as anticipated.  27 minutes of non-face-to-face time during this encounter.  Patient notes ongoing anxiety.  Definitely needs for medication.  No negative side effects.  Without it she is not sure what she would do.  Uses both for anxiety and insomnia.  Has also been receiving medication from Dr. Carloyn Manner for chronic back pain including anti-inflammatory muscle spasm medication.  Would like for [place] to take over management of this.  Had requested if we would prescribe Restasis.  I advised her I do not prescribe this  will need ongoing eye doctor management  Ongoing asthma stable per patient.  Singulair is still helps.  Definitely maintains.  Rare use of inhaler.  Would like to use Xopenex because the albuterol makes her shaky      Objective:   Physical Exam  Virtual      Assessment & Plan:  Impression hypertension.  Good care management.  Compliance discussed diet and exercise discussed medications refilled  2.  Asthma.  Clinically stable to maintain Singulair continue Xopenex as needed  3.  Chronic back pain.  We will maintain anti-inflammatory and muscle spasm medicine per patient request  4.  Chronic anxiety ongoing and substantial.  Medications refilled rationale discussed  Follow-up in 6 months diet exercise discussed.  Flu shot discussed.  Medications refilled.

## 2019-06-30 ENCOUNTER — Other Ambulatory Visit: Payer: Self-pay | Admitting: *Deleted

## 2019-06-30 ENCOUNTER — Other Ambulatory Visit: Payer: Self-pay | Admitting: Family Medicine

## 2019-06-30 ENCOUNTER — Telehealth: Payer: Self-pay | Admitting: Family Medicine

## 2019-06-30 MED ORDER — CYCLOBENZAPRINE HCL 10 MG PO TABS: 10.0000 mg | ORAL_TABLET | Freq: Three times a day (TID) | ORAL | 1 refills | Status: DC | PRN

## 2019-06-30 MED ORDER — MELOXICAM 7.5 MG PO TABS: 7.5000 mg | ORAL_TABLET | Freq: Every day | ORAL | 1 refills | Status: DC

## 2019-06-30 MED ORDER — ALPRAZOLAM 0.5 MG PO TABS: ORAL_TABLET | ORAL | 5 refills | Status: DC

## 2019-06-30 NOTE — Telephone Encounter (Signed)
Pt notified script for xanax faxed.

## 2019-06-30 NOTE — Telephone Encounter (Signed)
Refills sent and pt notified on voicemail.

## 2019-06-30 NOTE — Telephone Encounter (Signed)
Script for xanax was not yet faxed that dr Richardson Landry approve yesterday so I faxed it to the [facility] in [place]. Fax number [phone].

## 2019-06-30 NOTE — Telephone Encounter (Signed)
Pt also requesting refill on flexeril one tid #270 and meloxicam 7.5mg   One daily #90 prescribed originally by dr Carloyn Manner. He is retiring and wanted to see if dr Richardson Landry would refill.   [facility] [address]

## 2019-06-30 NOTE — Telephone Encounter (Signed)
Patient needs her prescription for xanax 0.5 sent to [facility] in [place] due to shortage here in [place]. They wont get any until December and she states cant wait that long.[facility]-phone no:[phone]

## 2019-06-30 NOTE — Telephone Encounter (Signed)
Sure six mo worth 

## 2019-07-03 ENCOUNTER — Encounter: Payer: Self-pay | Admitting: Family Medicine

## 2019-07-26 ENCOUNTER — Other Ambulatory Visit: Payer: Self-pay | Admitting: Family Medicine

## 2019-07-29 ENCOUNTER — Other Ambulatory Visit: Payer: Self-pay | Admitting: Cardiovascular Disease

## 2019-08-23 DIAGNOSIS — Z029 Encounter for administrative examinations, unspecified: Secondary | ICD-10-CM

## 2019-09-06 DIAGNOSIS — H16143 Punctate keratitis, bilateral: Secondary | ICD-10-CM | POA: Diagnosis not present

## 2019-09-06 DIAGNOSIS — H04123 Dry eye syndrome of bilateral lacrimal glands: Secondary | ICD-10-CM | POA: Diagnosis not present

## 2019-09-22 ENCOUNTER — Institutional Professional Consult (permissible substitution): Payer: BC Managed Care – PPO | Admitting: Neurology

## 2019-09-23 ENCOUNTER — Ambulatory Visit: Payer: BC Managed Care – PPO | Admitting: Cardiovascular Disease

## 2019-09-30 ENCOUNTER — Ambulatory Visit: Payer: BC Managed Care – PPO | Admitting: Cardiovascular Disease

## 2019-10-12 ENCOUNTER — Encounter: Payer: Self-pay | Admitting: Family Medicine

## 2019-10-31 ENCOUNTER — Telehealth: Payer: Self-pay | Admitting: Family Medicine

## 2019-10-31 NOTE — Telephone Encounter (Signed)
Patient is completely out of some of her medications and the pharmacy has faxed over prior authorizations on them. She is requesting to speak with nurse first because her insurance is wanting her to do step down medications or cheaper medication first.[phone] Patient was last seen 06/29/2019

## 2019-10-31 NOTE — Telephone Encounter (Signed)
PA said approved on cover my meds but pharm said it was still saying needs PA. I called and told pt and told her I would check with pharm in the morning. And if it is approved pt wasnts 90 day supply instead of 30 day.

## 2019-11-01 ENCOUNTER — Other Ambulatory Visit: Payer: Self-pay | Admitting: *Deleted

## 2019-11-01 MED ORDER — DEXILANT 60 MG PO CPDR: DELAYED_RELEASE_CAPSULE | ORAL | 0 refills | Status: DC

## 2019-11-01 NOTE — Telephone Encounter (Signed)
Called pharm this morning and med did go through. I called and let pt know also it went through and changed rx to 90 day per dr Brett Canales.

## 2019-11-02 ENCOUNTER — Other Ambulatory Visit: Payer: Self-pay | Admitting: *Deleted

## 2019-11-02 ENCOUNTER — Telehealth: Payer: Self-pay | Admitting: Family Medicine

## 2019-11-02 MED ORDER — DEXILANT 60 MG PO CPDR: DELAYED_RELEASE_CAPSULE | ORAL | 5 refills | Status: DC

## 2019-11-02 NOTE — Telephone Encounter (Signed)
Refills sent per dr steve. Pt notified.  

## 2019-11-02 NOTE — Telephone Encounter (Signed)
Pts new insurance will not cover the 90 day supply. So yesterday she got the 30 day supply which only leaves a partial refill on file for her. She is wanting to know if it can be resent to pharmacy like it was the first time 30 day supply with 5 refills.   [facility] [address].

## 2019-11-15 ENCOUNTER — Institutional Professional Consult (permissible substitution): Payer: Self-pay | Admitting: Neurology

## 2020-01-01 ENCOUNTER — Telehealth: Payer: Self-pay | Admitting: Family Medicine

## 2020-01-02 NOTE — Telephone Encounter (Signed)
Pt states the last script she had expired 2 days before she requested to get it filled. She normally follows up once a year with Dr. Brett Canales so doesn't feel a follow up is needed right now.

## 2020-01-02 NOTE — Telephone Encounter (Signed)
Please contact patient to schedule appointment soon.

## 2020-01-02 NOTE — Telephone Encounter (Signed)
lvm to schedule med check  

## 2020-01-02 NOTE — Telephone Encounter (Signed)
Pt states she still has a few pills and she is not going to pharmacy to pick up 10 pills that DR. Ladona Ridgel gave her and wants Dr. Brett Canales to review her message when he is back in the office next week. States she has enough til then.

## 2020-01-02 NOTE — Telephone Encounter (Signed)
Needs to be seen for follow up appt for this medicaiton.  Small amt given today.   Thanks,   Dr. Ladona Ridgel

## 2020-01-08 NOTE — Telephone Encounter (Signed)
Actually at the October visit I recommended follow up in six months. Since dr Kirtland Bouchard the cardiologist at this time is managing her hypertension, heart rhytn issues etc, may refill xanax for six months

## 2020-01-09 ENCOUNTER — Other Ambulatory Visit: Payer: Self-pay | Admitting: *Deleted

## 2020-01-09 MED ORDER — ALPRAZOLAM 0.5 MG PO TABS: ORAL_TABLET | ORAL | 5 refills | Status: DC

## 2020-01-09 NOTE — Telephone Encounter (Signed)
Patient notified -- rx awaitng MD sig.

## 2020-01-09 NOTE — Telephone Encounter (Signed)
Faxed script

## 2020-01-27 ENCOUNTER — Other Ambulatory Visit: Payer: Self-pay | Admitting: *Deleted

## 2020-01-27 MED ORDER — DILTIAZEM HCL 30 MG PO TABS: 30.0000 mg | ORAL_TABLET | Freq: Three times a day (TID) | ORAL | 0 refills | Status: DC

## 2020-02-03 ENCOUNTER — Other Ambulatory Visit: Payer: Self-pay | Admitting: Family Medicine

## 2020-02-03 NOTE — Telephone Encounter (Signed)
06/29/19 was last visit

## 2020-02-03 NOTE — Telephone Encounter (Signed)
Pt needs appt for follow up in the next 1-2 months. Thx. Dr. Ladona Ridgel

## 2020-04-11 ENCOUNTER — Telehealth: Payer: Self-pay | Admitting: Family Medicine

## 2020-04-11 NOTE — Telephone Encounter (Signed)
Pt needs to have her handicap placard renewed, pt states Dr. Channing Mutters (neurosurgeon) used to do this for her but he is now retired as is Dr. Brett Canales.  Can we complete this for her or does she NTBS?  Please advise & call pt

## 2020-04-13 NOTE — Telephone Encounter (Signed)
Pt contacted and verbalized understanding. Pt would like a phone visit because she is not comfortable coming into dr office. Pt transferred up front to set up appt.

## 2020-04-13 NOTE — Telephone Encounter (Signed)
Needs appt. Thx. Dr. Ladona Ridgel

## 2020-05-07 ENCOUNTER — Telehealth (INDEPENDENT_AMBULATORY_CARE_PROVIDER_SITE_OTHER): Payer: BC Managed Care – PPO | Admitting: Family Medicine

## 2020-05-07 ENCOUNTER — Encounter: Payer: Self-pay | Admitting: Family Medicine

## 2020-05-07 ENCOUNTER — Telehealth: Payer: Self-pay | Admitting: Family Medicine

## 2020-05-07 ENCOUNTER — Other Ambulatory Visit: Payer: Self-pay

## 2020-05-07 DIAGNOSIS — G8929 Other chronic pain: Secondary | ICD-10-CM

## 2020-05-07 DIAGNOSIS — F411 Generalized anxiety disorder: Secondary | ICD-10-CM | POA: Diagnosis not present

## 2020-05-07 DIAGNOSIS — M545 Low back pain: Secondary | ICD-10-CM

## 2020-05-07 NOTE — Telephone Encounter (Signed)
Levetta husband dropped of [employer] Form

## 2020-05-07 NOTE — Telephone Encounter (Signed)
Note ready for pick up. Thx. Dr. Ladona Ridgel

## 2020-05-07 NOTE — Progress Notes (Signed)
Patient ID: Victoria [name], female    DOB: 09-Oct-1960, 59 y.o.   MRN: [id number]  Virtual Visit via Telephone Note  I connected with Victoria [name] on 05/07/20 at 11:30 AM EDT by telephone and verified that I am speaking with the correct person using two identifiers.  Location: Patient: home Provider: office   I discussed the limitations, risks, security and privacy concerns of performing an evaluation and management service by telephone and the availability of in person appointments. I also discussed with the patient that there may be a patient responsible charge related to this service. The patient expressed understanding and agreed to proceed.   Chief Complaint  Patient presents with  . Form Completion   Subjective:    HPI  pt wants to renew disability parking placard. Pt states she has trouble walking 200 feet without stopping due to back pain. States neurosurgeon use to fill this out for her but he is now retired. (Dr. Channing Mutters)  Has been seen by him for past 15 yrs. H/o spinal stenosis and arthritis. H/o chronic back pain. No h/o surgery. Dr. Channing Mutters retired last year. Went through PT in past, but shut down from covid pandemic.  Hard time walking long distances. Hard to walk up and down hills.  H/o anxiety- Was given therapist in past for anxiety. Pt not wanting to see psychiatrist for anxiety.  Was recommended in past, but pt decided to cancel the appt. Discussed the pt that I don't prescribe benzodiazepines long term. Pt stating she tried lexapro in past and had too many side effects and discontinued. Pt stating she takes the xanax for dizziness and vertigo. PT stating only taking it prn, not taking it daily.  Goes some weeks without taking it at all.  Has been on xanax since 2014.  Medical History Victoria [name] has a past medical history of Asthma, GERD (gastroesophageal reflux disease), GERD (gastroesophageal reflux disease), Headache, Hypertension, Scoliosis, and Vertigo.    Outpatient Encounter Medications as of 05/07/2020  Medication Sig  . Calcium Carb-Cholecalciferol (CALCIUM 1000 + D PO) Take by mouth 2 (two) times daily.  . cyclobenzaprine (FLEXERIL) 10 MG tablet Take 1 tablet (10 mg total) by mouth 3 (three) times daily as needed for muscle spasms.  . DEXILANT 60 MG capsule TAKE 1 CAPSULE BY MOUTH EVERY DAY  . dicyclomine (BENTYL) 10 MG capsule TAKE 1 CAPSULE(10 MG) BY MOUTH THREE TIMES DAILY  . diltiazem (CARDIZEM) 30 MG tablet Take 1 tablet (30 mg total) by mouth 3 (three) times daily.  . fexofenadine (ALLEGRA) 180 MG tablet Take 180 mg by mouth daily.  . fluticasone (FLONASE) 50 MCG/ACT nasal spray Place 2 sprays into both nostrils daily.  Marland Kitchen levalbuterol (XOPENEX HFA) 45 MCG/ACT inhaler Inhale 2 puffs into the lungs every 6 (six) hours as needed for wheezing.  . Magnesium 400 MG TABS Take 1 tablet by mouth daily.  . meloxicam (MOBIC) 7.5 MG tablet Take 1 tablet (7.5 mg total) by mouth daily.  . metroNIDAZOLE (METROGEL) 1 % gel APPLY TO FACE EVERY NIGHT AT BEDTIME AS NEEDED  . mometasone (NASONEX) 50 MCG/ACT nasal spray SHAKE LQ AND U 2 SPRAYS IEN QD  . montelukast (SINGULAIR) 10 MG tablet TAKE 1 TABLET(10 MG) BY MOUTH AT BEDTIME  . Multiple Vitamin (MULTIVITAMIN) tablet Take 1 tablet by mouth daily.  . polyethylene glycol (MIRALAX / GLYCOLAX) packet Take 17 g by mouth daily.  Marland Kitchen PREVIDENT 5000 SENSITIVE 1.1-5 % PSTE   . psyllium (METAMUCIL) 58.6 %  powder Take 1 packet by mouth daily.   . RESTASIS 0.05 % ophthalmic emulsion INSTILL 1 DROP IN EACH EYE Q 12 H  . [DISCONTINUED] ALPRAZolam (XANAX) 0.5 MG tablet TAKE 1 TABLET EVERY MORNING AS NEEDED FOR ANXIETY AND TABLET 1 TABLET AT BEDTIME FOR SLEEP AS NEEDED  . [DISCONTINUED] HYDROcodone-acetaminophen (NORCO/VICODIN) 5-325 MG tablet Take one every 4 - 6 hours prn pain  . [DISCONTINUED] Probiotic Product (RESTORA PO) Take by mouth.   No facility-administered encounter medications on file as of 05/07/2020.      Review of Systems  Constitutional: Negative for chills and fever.  HENT: Negative for congestion, rhinorrhea and sore throat.   Respiratory: Negative for cough, shortness of breath and wheezing.   Cardiovascular: Negative for chest pain and leg swelling.  Gastrointestinal: Negative for abdominal pain, diarrhea, nausea and vomiting.  Genitourinary: Negative for dysuria and frequency.  Musculoskeletal: Positive for back pain. Negative for arthralgias. Gait problem:  chronic back pain.  Skin: Negative for rash.  Neurological: Negative for dizziness, weakness and headaches.     Vitals There were no vitals taken for this visit.  Objective:   Physical Exam  No PE due to phone visit.  Assessment and Plan   1. Chronic midline low back pain without sciatica  2. Generalized anxiety disorder - Ambulatory referral to Psychiatry   chronic back pain- pt wanting handicap placard, advise I would sign it for 35months, pt needing to re-establish with ortho or neurosurgery for futher placard forms.   Anxiety- I had a long discussion with the patient about the safety concerns of taking anxiety medications together over long periods of time.     It increases risk for memory concerns, early dementia and increased risk of falls.   In addition, there is newer guidelines about treating patients with anxiety with SSRI or alternatives to benzodiazepines.   It is no longer safe to continue these medications together and we would need to start to taper off some of these medications.   I offered to help patient with alternative medications or a referral to psychiatry specialist if patient is not able to taper off the medications or does not desire to try to taper off the medications or try alternatives.   Pt decided she would like a referral for the xanax medication and to see someone for the anxiety.  Pt unwilling to taper off the medication or try an alternative.  Will fill out the placard for  6 months.   F/u 62mo or prn.    Follow Up Instructions:    I discussed the assessment and treatment plan with the patient. The patient was provided an opportunity to ask questions and all were answered. The patient agreed with the plan and demonstrated an understanding of the instructions.   The patient was advised to call back or seek an in-person evaluation if the symptoms worsen or if the condition fails to improve as anticipated.  I provided 15 minutes of non-face-to-face time during this encounter.   Angelgabriel Willmore Orie Fisherman, DO

## 2020-05-07 NOTE — Telephone Encounter (Signed)
Patient notified and verbalized understanding. 

## 2020-05-08 ENCOUNTER — Telehealth: Payer: Self-pay | Admitting: Family Medicine

## 2020-05-08 MED ORDER — MELOXICAM 7.5 MG PO TABS: 7.5000 mg | ORAL_TABLET | Freq: Every day | ORAL | 0 refills | Status: DC

## 2020-05-08 NOTE — Telephone Encounter (Signed)
Patient is requesting refill on meloxicam 7.5 mg called into [facility]

## 2020-05-21 ENCOUNTER — Telehealth: Payer: Self-pay | Admitting: *Deleted

## 2020-05-21 NOTE — Telephone Encounter (Signed)
Pt requested to try Cardizem CD per Dr Purvis Sheffield 06/2019 telehealth appt - pt was to f/u in 3 month but did not have time to schedule - pt aware that Dr Purvis Sheffield is no longer with [facility] and scheduled with Nena Polio, NP next week - also requested to establish with MD - scheduled with Dr Diona Browner 10/25
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
## 2020-05-31 ENCOUNTER — Ambulatory Visit: Payer: BC Managed Care – PPO | Admitting: Family Medicine

## 2020-05-31 ENCOUNTER — Encounter: Payer: Self-pay | Admitting: Family Medicine

## 2020-05-31 VITALS — BP 122/78 | HR 93 | Ht 64.0 in | Wt 138.0 lb

## 2020-05-31 DIAGNOSIS — R002 Palpitations: Secondary | ICD-10-CM

## 2020-05-31 MED ORDER — DILTIAZEM HCL ER COATED BEADS 120 MG PO CP24: 120.0000 mg | ORAL_CAPSULE | Freq: Every day | ORAL | 6 refills | Status: DC

## 2020-05-31 NOTE — Patient Instructions (Addendum)
Medication Instructions:   Stop short acting Diltiazem 30mg .   Begin long acting - Diltiazem CD 120mg  daily.  Continue all other medications.    Labwork: none  Testing/Procedures: none  Follow-Up: 1 month   Any Other Special Instructions Will Be Listed Below (If Applicable). Your physician has requested that you regularly monitor and record your blood pressure readings at home x 2 weeks. Please use the same machine at the same time of day to check your readings and record them to bring to office for provider review.    If you need a refill on your cardiac medications before your next appointment, please call your pharmacy.

## 2020-05-31 NOTE — Addendum Note (Signed)
Addended by: Franchot Pollitt T on: 05/31/2020 04:18 PM   Modules accepted: Orders  

## 2020-07-04 ENCOUNTER — Ambulatory Visit: Payer: Self-pay | Admitting: Cardiology

## 2020-07-16 ENCOUNTER — Ambulatory Visit: Payer: Self-pay | Admitting: Cardiology

## 2020-08-03 ENCOUNTER — Other Ambulatory Visit: Payer: Self-pay | Admitting: Family Medicine

## 2020-08-06 NOTE — Telephone Encounter (Signed)
05/07/20 was last visit

## 2020-08-06 NOTE — Telephone Encounter (Signed)
Needs appt for f/u on back pain in 3 months.  Thx. Dr. Ladona Ridgel

## 2020-08-06 NOTE — Telephone Encounter (Signed)
Please schedule follow up in 3 months per dr Ladona Ridgel

## 2020-08-13 ENCOUNTER — Other Ambulatory Visit: Payer: Self-pay | Admitting: *Deleted

## 2020-08-13 MED ORDER — MONTELUKAST SODIUM 10 MG PO TABS: ORAL_TABLET | ORAL | 2 refills | Status: DC

## 2020-08-17 DIAGNOSIS — Z13820 Encounter for screening for osteoporosis: Secondary | ICD-10-CM | POA: Diagnosis not present

## 2020-08-17 DIAGNOSIS — Z6824 Body mass index (BMI) 24.0-24.9, adult: Secondary | ICD-10-CM | POA: Diagnosis not present

## 2020-08-17 DIAGNOSIS — Z1329 Encounter for screening for other suspected endocrine disorder: Secondary | ICD-10-CM | POA: Diagnosis not present

## 2020-08-17 DIAGNOSIS — Z1231 Encounter for screening mammogram for malignant neoplasm of breast: Secondary | ICD-10-CM | POA: Diagnosis not present

## 2020-08-17 DIAGNOSIS — Z01419 Encounter for gynecological examination (general) (routine) without abnormal findings: Secondary | ICD-10-CM | POA: Diagnosis not present

## 2020-08-17 DIAGNOSIS — Z1321 Encounter for screening for nutritional disorder: Secondary | ICD-10-CM | POA: Diagnosis not present

## 2020-08-17 DIAGNOSIS — Z13220 Encounter for screening for lipoid disorders: Secondary | ICD-10-CM | POA: Diagnosis not present

## 2020-08-17 DIAGNOSIS — Z13228 Encounter for screening for other metabolic disorders: Secondary | ICD-10-CM | POA: Diagnosis not present

## 2020-08-28 ENCOUNTER — Telehealth: Payer: Self-pay | Admitting: Family Medicine

## 2020-08-28 NOTE — Telephone Encounter (Signed)
Pharmacy requesting refill on Fluticasone 50 mcg nasal spray. Shake liquid and use 2 sprays in each nostril daily. Pt last seen 05/07/20. Please advise. Thank you

## 2020-08-29 ENCOUNTER — Other Ambulatory Visit: Payer: Self-pay | Admitting: Family Medicine

## 2020-08-29 MED ORDER — FLUTICASONE PROPIONATE 50 MCG/ACT NA SUSP: 2.0000 | Freq: Every day | NASAL | 3 refills | Status: AC

## 2020-08-29 NOTE — Telephone Encounter (Signed)
Refills sent to pharmacy. 

## 2020-08-29 NOTE — Addendum Note (Signed)
Addended by: Marlowe Shores on: 08/29/2020 10:20 AM   Modules accepted: Orders

## 2020-08-29 NOTE — Telephone Encounter (Signed)
Yes, pls give with 3 refills. Thx. Dr. Ladona Ridgel

## 2020-09-11 DIAGNOSIS — N951 Menopausal and female climacteric states: Secondary | ICD-10-CM | POA: Diagnosis not present

## 2020-09-11 DIAGNOSIS — R350 Frequency of micturition: Secondary | ICD-10-CM | POA: Diagnosis not present

## 2020-09-11 DIAGNOSIS — Z1152 Encounter for screening for COVID-19: Secondary | ICD-10-CM | POA: Diagnosis not present

## 2020-10-03 DIAGNOSIS — K219 Gastro-esophageal reflux disease without esophagitis: Secondary | ICD-10-CM | POA: Diagnosis not present

## 2020-10-03 DIAGNOSIS — K580 Irritable bowel syndrome with diarrhea: Secondary | ICD-10-CM | POA: Diagnosis not present

## 2020-10-03 DIAGNOSIS — Z1211 Encounter for screening for malignant neoplasm of colon: Secondary | ICD-10-CM | POA: Diagnosis not present

## 2020-10-03 DIAGNOSIS — R140 Abdominal distension (gaseous): Secondary | ICD-10-CM | POA: Diagnosis not present

## 2020-10-09 ENCOUNTER — Other Ambulatory Visit: Payer: Self-pay | Admitting: Cardiology

## 2020-10-09 ENCOUNTER — Telehealth: Payer: Self-pay | Admitting: Family Medicine

## 2020-10-09 MED ORDER — DILTIAZEM HCL 30 MG PO TABS: 30.0000 mg | ORAL_TABLET | Freq: Three times a day (TID) | ORAL | 1 refills | Status: DC

## 2020-10-09 NOTE — Telephone Encounter (Signed)
Okay thank you

## 2020-10-09 NOTE — Telephone Encounter (Signed)
Pt never started diltiazem 120 mg - wanted to refill dilt 30 mg tid as pt was previously taking - f/u scheduled for July with Dr Diona Browner for past due f/u and will forward to Nena Polio, NP as Lorain Childes

## 2020-10-09 NOTE — Telephone Encounter (Signed)
New message      *STAT* If patient is at the pharmacy, call can be transferred to refill team.   1. Which medications need to be refilled? (please list name of each medication and dose if known) diltiazem 30 mg 3x a day -  Pt states she has never taken the 120 mg   2. Which pharmacy/location (including street and city if local pharmacy) is medication to be sent to? [facility] [address]   3. Do they need a 30 day or 90 day supply? 90

## 2020-11-01 DIAGNOSIS — R350 Frequency of micturition: Secondary | ICD-10-CM | POA: Diagnosis not present

## 2020-11-01 DIAGNOSIS — N952 Postmenopausal atrophic vaginitis: Secondary | ICD-10-CM | POA: Diagnosis not present

## 2020-11-01 DIAGNOSIS — N3281 Overactive bladder: Secondary | ICD-10-CM | POA: Diagnosis not present

## 2020-11-06 ENCOUNTER — Other Ambulatory Visit: Payer: Self-pay | Admitting: Family Medicine

## 2020-11-06 DIAGNOSIS — N952 Postmenopausal atrophic vaginitis: Secondary | ICD-10-CM | POA: Diagnosis not present

## 2020-11-06 DIAGNOSIS — N760 Acute vaginitis: Secondary | ICD-10-CM | POA: Diagnosis not present

## 2020-11-06 DIAGNOSIS — N390 Urinary tract infection, site not specified: Secondary | ICD-10-CM | POA: Diagnosis not present

## 2020-11-10 ENCOUNTER — Other Ambulatory Visit: Payer: Self-pay | Admitting: Family Medicine

## 2020-11-12 NOTE — Telephone Encounter (Signed)
LM to schedule appointment

## 2020-11-12 NOTE — Telephone Encounter (Signed)
Needs appt then route back for refill  

## 2020-11-13 DIAGNOSIS — R350 Frequency of micturition: Secondary | ICD-10-CM | POA: Diagnosis not present

## 2020-11-22 NOTE — Telephone Encounter (Signed)
Called twice not wanting to schedule right now. Will call back

## 2020-11-23 NOTE — Progress Notes (Deleted)
Office Visit Note  Patient: Victoria [name]             Date of Birth: [birth year]           MRN: [id number]             PCP: Annalee Genta, DO Referring: [name], MD Visit Date: 12/04/2020 Occupation: @GUAROCC @  Subjective:  No chief complaint on file.   History of Present Illness: Victoria [name] is a 60 y.o. female ***   Activities of Daily Living:  Patient reports morning stiffness for *** {minute/hour:19697}.   Patient {ACTIONS;DENIES/REPORTS:21021675::"Denies"} nocturnal pain.  Difficulty dressing/grooming: {ACTIONS;DENIES/REPORTS:21021675::"Denies"} Difficulty climbing stairs: {ACTIONS;DENIES/REPORTS:21021675::"Denies"} Difficulty getting out of chair: {ACTIONS;DENIES/REPORTS:21021675::"Denies"} Difficulty using hands for taps, buttons, cutlery, and/or writing: {ACTIONS;DENIES/REPORTS:21021675::"Denies"}  No Rheumatology ROS completed.   PMFS History:  Patient Active Problem List   Diagnosis Date Noted  . Essential hypertension 06/09/2018  . Cough 10/03/2016  . Solitary pulmonary nodule 10/03/2016  . Anxiety state 10/01/2016  . Asthma 09/30/2016  . Dizziness 08/20/2016  . Other fatigue 08/20/2016  . 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 Palpitations 07/21/2014  . Tachycardia 07/21/2014  . Irritable bowel syndrome 09/11/2013  . Insomnia 09/11/2013  . Generalized anxiety disorder 09/11/2013  . Esophageal reflux 09/11/2013  . Allergic rhinitis 09/11/2013    Past Medical History:  Diagnosis Date  . Asthma   . GERD (gastroesophageal reflux disease)   . GERD (gastroesophageal reflux disease)   . Headache   . Hypertension   . Scoliosis   . Vertigo     Family History  Problem Relation Age of Onset  . Heart attack Father   . Stroke Father    Past Surgical History:  Procedure Laterality Date  . COLONOSCOPY     Social History   Social History Narrative  . Not on file   Immunization History  Administered Date(s) Administered  .  Influenza,inj,quad, With Preservative 07/04/2019  . Influenza-Unspecified 06/17/2013, 07/07/2016, 06/30/2017, 06/11/2018, 06/29/2020  . Td 09/08/2000  . Tdap 01/28/2016     Objective: Vital Signs: There were no vitals taken for this visit.   Physical Exam   Musculoskeletal Exam: ***  CDAI Exam: CDAI Score: -- Patient Global: --; Provider Global: -- Swollen: --; Tender: -- Joint Exam 12/04/2020   No joint exam has been documented for this visit   There is currently no information documented on the homunculus. Go to the Rheumatology activity and complete the homunculus joint exam.  Investigation: No additional findings.  Imaging: No results found.  Recent Labs: Lab Results  Component Value Date   WBC 5.0 10/21/2016   HGB 14.3 10/21/2016   PLT 203 10/21/2016   NA 139 07/24/2016   K 4.4 07/24/2016   CL 104 07/24/2016   CO2 25 07/24/2016   GLUCOSE 88 07/24/2016   BUN 10 07/24/2016   CREATININE 0.83 07/24/2016   BILITOT 0.9 07/24/2016   ALKPHOS 31 (L) 07/24/2016   AST 18 07/24/2016   ALT 17 07/24/2016   PROT 6.8 07/24/2016   ALBUMIN 4.2 07/24/2016   CALCIUM 9.3 07/24/2016    Speciality Comments: No specialty comments available.  Procedures:  No procedures performed Allergies: Patient has no known allergies.   Assessment / Plan:     Visit Diagnoses: Other fatigue  Body aches  Pain of left hip  Essential hypertension  History of asthma  History of IBS  History of gastroesophageal reflux (GERD)  Verbal fluency disorder  Solitary pulmonary nodule  Palpitations  Generalized anxiety disorder  Orders: No orders of the defined types were placed in this encounter.  No orders of the defined types were placed in this encounter.   Face-to-face time spent with patient was *** minutes. Greater than 50% of time was spent in counseling and coordination of care.  Follow-Up Instructions: No follow-ups on file.   Gearldine Bienenstock, PA-C  Note - This  record has been created using Dragon software.  Chart creation errors have been sought, but may not always  have been located. Such creation errors do not reflect on  the standard of medical care.

## 2020-12-04 ENCOUNTER — Ambulatory Visit: Payer: BC Managed Care – PPO | Admitting: Rheumatology

## 2020-12-04 DIAGNOSIS — Z8709 Personal history of other diseases of the respiratory system: Secondary | ICD-10-CM

## 2020-12-04 DIAGNOSIS — R5383 Other fatigue: Secondary | ICD-10-CM

## 2020-12-04 DIAGNOSIS — R911 Solitary pulmonary nodule: Secondary | ICD-10-CM

## 2020-12-04 DIAGNOSIS — I10 Essential (primary) hypertension: Secondary | ICD-10-CM

## 2020-12-04 DIAGNOSIS — R52 Pain, unspecified: Secondary | ICD-10-CM

## 2020-12-04 DIAGNOSIS — M25552 Pain in left hip: Secondary | ICD-10-CM

## 2020-12-04 DIAGNOSIS — F411 Generalized anxiety disorder: Secondary | ICD-10-CM

## 2020-12-04 DIAGNOSIS — R4789 Other speech disturbances: Secondary | ICD-10-CM

## 2020-12-04 DIAGNOSIS — Z8719 Personal history of other diseases of the digestive system: Secondary | ICD-10-CM

## 2020-12-04 DIAGNOSIS — R002 Palpitations: Secondary | ICD-10-CM

## 2021-01-10 ENCOUNTER — Ambulatory Visit: Payer: BC Managed Care – PPO | Admitting: Rheumatology

## 2021-01-18 NOTE — Progress Notes (Deleted)
Office Visit Note  Patient: Victoria [name]             Date of Birth: Aug 03, 1961           MRN: [id number]             PCP: Annalee Genta, DO Referring: [name], MD Visit Date: 01/30/2021 Occupation: @GUAROCC @  Subjective:  No chief complaint on file.   History of Present Illness: Victoria [name] is a 60 y.o. female ***   Activities of Daily Living:  Patient reports morning stiffness for *** {minute/hour:19697}.   Patient {ACTIONS;DENIES/REPORTS:21021675::"Denies"} nocturnal pain.  Difficulty dressing/grooming: {ACTIONS;DENIES/REPORTS:21021675::"Denies"} Difficulty climbing stairs: {ACTIONS;DENIES/REPORTS:21021675::"Denies"} Difficulty getting out of chair: {ACTIONS;DENIES/REPORTS:21021675::"Denies"} Difficulty using hands for taps, buttons, cutlery, and/or writing: {ACTIONS;DENIES/REPORTS:21021675::"Denies"}  No Rheumatology ROS completed.   PMFS History:  Patient Active Problem List   Diagnosis Date Noted  . Essential hypertension 06/09/2018  . Cough 10/03/2016  . Solitary pulmonary nodule 10/03/2016  . Anxiety state 10/01/2016  . Asthma 09/30/2016  . Dizziness 08/20/2016  . Other fatigue 08/20/2016  . 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 Palpitations 07/21/2014  . Tachycardia 07/21/2014  . Irritable bowel syndrome 09/11/2013  . Insomnia 09/11/2013  . Generalized anxiety disorder 09/11/2013  . Esophageal reflux 09/11/2013  . Allergic rhinitis 09/11/2013    Past Medical History:  Diagnosis Date  . Asthma   . GERD (gastroesophageal reflux disease)   . GERD (gastroesophageal reflux disease)   . Headache   . Hypertension   . Scoliosis   . Vertigo     Family History  Problem Relation Age of Onset  . Heart attack Father   . Stroke Father    Past Surgical History:  Procedure Laterality Date  . COLONOSCOPY     Social History   Social History Narrative  . Not on file   Immunization History  Administered Date(s) Administered  .  Influenza,inj,quad, With Preservative 07/04/2019  . Influenza-Unspecified 06/17/2013, 07/07/2016, 06/30/2017, 06/11/2018, 06/29/2020  . Td 09/08/2000  . Tdap 01/28/2016     Objective: Vital Signs: There were no vitals taken for this visit.   Physical Exam   Musculoskeletal Exam: ***  CDAI Exam: CDAI Score: -- Patient Global: --; Provider Global: -- Swollen: --; Tender: -- Joint Exam 01/30/2021   No joint exam has been documented for this visit   There is currently no information documented on the homunculus. Go to the Rheumatology activity and complete the homunculus joint exam.  Investigation: No additional findings.  Imaging: No results found.  Recent Labs: Lab Results  Component Value Date   WBC 5.0 10/21/2016   HGB 14.3 10/21/2016   PLT 203 10/21/2016   NA 139 07/24/2016   K 4.4 07/24/2016   CL 104 07/24/2016   CO2 25 07/24/2016   GLUCOSE 88 07/24/2016   BUN 10 07/24/2016   CREATININE 0.83 07/24/2016   BILITOT 0.9 07/24/2016   ALKPHOS 31 (L) 07/24/2016   AST 18 07/24/2016   ALT 17 07/24/2016   PROT 6.8 07/24/2016   ALBUMIN 4.2 07/24/2016   CALCIUM 9.3 07/24/2016    Speciality Comments: No specialty comments available.  Procedures:  No procedures performed Allergies: Patient has no known allergies.   Assessment / Plan:     Visit Diagnoses: Body aches  Other fatigue  Pain in left hip  Essential hypertension  History of asthma  History of gastroesophageal reflux (GERD)  History of IBS  Palpitations  GAD (generalized anxiety disorder)  Other insomnia  Verbal fluency disorder  Solitary pulmonary nodule  Orders: No orders of the defined types were placed in this encounter.  No orders of the defined types were placed in this encounter.   Face-to-face time spent with patient was *** minutes. Greater than 50% of time was spent in counseling and coordination of care.  Follow-Up Instructions: No follow-ups on file.   Gearldine Bienenstock,  PA-C  Note - This record has been created using Dragon software.  Chart creation errors have been sought, but may not always  have been located. Such creation errors do not reflect on  the standard of medical care.

## 2021-01-29 DIAGNOSIS — R12 Heartburn: Secondary | ICD-10-CM | POA: Diagnosis not present

## 2021-01-29 DIAGNOSIS — K219 Gastro-esophageal reflux disease without esophagitis: Secondary | ICD-10-CM | POA: Diagnosis not present

## 2021-01-29 DIAGNOSIS — Z1211 Encounter for screening for malignant neoplasm of colon: Secondary | ICD-10-CM | POA: Diagnosis not present

## 2021-01-29 DIAGNOSIS — K5730 Diverticulosis of large intestine without perforation or abscess without bleeding: Secondary | ICD-10-CM | POA: Diagnosis not present

## 2021-01-29 HISTORY — PX: COLONOSCOPY: SHX174

## 2021-01-30 ENCOUNTER — Ambulatory Visit: Payer: BC Managed Care – PPO | Admitting: Rheumatology

## 2021-02-01 ENCOUNTER — Other Ambulatory Visit: Payer: Self-pay | Admitting: Family Medicine

## 2021-02-28 ENCOUNTER — Ambulatory Visit: Payer: BC Managed Care – PPO | Admitting: Rheumatology

## 2021-03-18 ENCOUNTER — Encounter: Payer: Self-pay | Admitting: Cardiology

## 2021-03-18 ENCOUNTER — Other Ambulatory Visit: Payer: Self-pay

## 2021-03-18 ENCOUNTER — Ambulatory Visit: Payer: BC Managed Care – PPO | Admitting: Cardiology

## 2021-03-18 VITALS — BP 156/96 | HR 102 | Ht 63.5 in | Wt 147.0 lb

## 2021-03-18 DIAGNOSIS — I493 Ventricular premature depolarization: Secondary | ICD-10-CM

## 2021-03-18 DIAGNOSIS — R002 Palpitations: Secondary | ICD-10-CM | POA: Diagnosis not present

## 2021-03-18 DIAGNOSIS — Z8679 Personal history of other diseases of the circulatory system: Secondary | ICD-10-CM

## 2021-03-18 NOTE — Progress Notes (Signed)
Cardiology Office Note  Date: 03/18/2021   ID: Jovie, Swanner [birth year], MRN [id number]  PCP:  Annalee Genta, DO  Cardiologist:  Nona Dell, MD Electrophysiologist:  None   Chief Complaint  Patient presents with   Cardiac follow-up     History of Present Illness: Victoria [name] is a 60 y.o. female former patient of Dr. Purvis Sheffield now presenting to establish follow-up with me.  I reviewed her records and updated the chart.  She was last seen in September 2021 by Mr. Vincenza Hews NP.  She has a history of palpitations, cardiac monitor in 2017 showed occasional PVCs that correlated with symptoms.  She also has a more remote history of PSVT but no obvious significant recurrences in several years.  She was initially treated with metoprolol, however had trouble with bronchospasm and this was converted to diltiazem.  She has preferred to stay on the short acting twice to 3 times a day dosing plan, did consider switching to Cardizem CD 120 mg daily after the last visit, but never made the conversion.  Past Medical History:  Diagnosis Date   Asthma    GERD (gastroesophageal reflux disease)    Headache    Hypertension    Palpitations    PSVT (paroxysmal supraventricular tachycardia) (HCC)    Scoliosis    Vertigo     Past Surgical History:  Procedure Laterality Date   COLONOSCOPY      Current Outpatient Medications  Medication Sig Dispense Refill   cyclobenzaprine (FLEXERIL) 10 MG tablet Take 1 tablet (10 mg total) by mouth 3 (three) times daily as needed for muscle spasms. 270 tablet 1   DEXILANT 60 MG capsule TAKE 1 CAPSULE BY MOUTH EVERY DAY 90 capsule 0   dicyclomine (BENTYL) 10 MG capsule TAKE 1 CAPSULE(10 MG) BY MOUTH THREE TIMES DAILY 90 capsule 3   diltiazem (CARDIZEM) 30 MG tablet Take 1 tablet (30 mg total) by mouth 3 (three) times daily. 270 tablet 1   fluticasone (FLONASE) 50 MCG/ACT nasal spray Place 2 sprays into both nostrils daily. 16 g 3   Magnesium 400 MG  TABS Take 1 tablet by mouth daily.     meloxicam (MOBIC) 7.5 MG tablet TAKE 1 TABLET(7.5 MG) BY MOUTH DAILY 90 tablet 0   metroNIDAZOLE (METROGEL) 1 % gel APPLY EXTERNALLY TO FACE EVERY NIGHT AT qhs prn. Needs appt for more refills. 55 g 0   Multiple Vitamin (MULTIVITAMIN) tablet Take 1 tablet by mouth daily.     polyethylene glycol (MIRALAX / GLYCOLAX) packet Take 17 g by mouth daily.     PREVIDENT 5000 SENSITIVE 1.1-5 % PSTE   5   psyllium (METAMUCIL) 58.6 % powder Take 1 packet by mouth daily.      RESTASIS 0.05 % ophthalmic emulsion INSTILL 1 DROP IN EACH EYE Q 12 H  3   Calcium Carb-Cholecalciferol (CALCIUM 1000 + D PO) Take by mouth 2 (two) times daily.     fexofenadine (ALLEGRA) 180 MG tablet Take 180 mg by mouth daily. (Patient not taking: Reported on 03/18/2021)     levalbuterol (XOPENEX HFA) 45 MCG/ACT inhaler Inhale 2 puffs into the lungs every 6 (six) hours as needed for wheezing. (Patient not taking: Reported on 03/18/2021) 1 Inhaler 5   mometasone (NASONEX) 50 MCG/ACT nasal spray SHAKE LQ AND U 2 SPRAYS IEN QD (Patient not taking: Reported on 03/18/2021)  0   montelukast (SINGULAIR) 10 MG tablet TAKE 1 TABLET(10 MG) BY MOUTH AT BEDTIME (Patient  not taking: Reported on 03/18/2021) 30 tablet 2   No current facility-administered medications for this visit.   Allergies:  Patient has no known allergies.   ROS: No syncope.  Physical Exam: VS:  BP (!) 156/96   Pulse (!) 102   Ht 5' 3.5" (1.613 m)   Wt 147 lb (66.7 kg)   SpO2 98%   BMI 25.63 kg/m , BMI Body mass index is 25.63 kg/m.  Wt Readings from Last 3 Encounters:  03/18/21 147 lb (66.7 kg)  05/31/20 138 lb (62.6 kg)  06/28/19 150 lb (68 kg)    General: Patient appears comfortable at rest. HEENT: Conjunctiva and lids normal, wearing a mask. Neck: No elevated JVP. Lungs: Clear to auscultation, nonlabored breathing at rest. Cardiac: Regular rate and rhythm, no S3 or significant systolic murmur, no pericardial rub.  ECG:   An ECG dated 05/31/2020 was personally reviewed today and demonstrated:  Sinus rhythm.  Recent Labwork:  No recent lab work for review.  Other Studies Reviewed Today:  Echocardiogram 08/04/2016: - Left ventricle: The cavity size was normal. Wall thickness was    normal. Systolic function was normal. The estimated ejection    fraction was in the range of 55% to 60%. Wall motion was normal;    there were no regional wall motion abnormalities. Doppler    parameters are consistent with abnormal left ventricular    relaxation (grade 1 diastolic dysfunction).  - Mitral valve: There was trivial regurgitation.  - Right atrium: Central venous pressure (est): 3 mm Hg.  - Pulmonary arteries: Systolic pressure could not be accurately    estimated.  - Pericardium, extracardiac: There was no pericardial effusion.  Assessment and Plan:  1.  History of palpitations with previously documented PVCs corresponding to symptoms and also a more remote history of PSVT.  Her most recent symptoms are consistent with PVCs based on description, she reports good symptom control on short acting Cardizem which we will continue for now.  Next step could be considering a trial of Cardizem CD 120 mg daily, or potentially beta-blocker such as bisoprolol.  2.  Elevated blood pressure with probable whitecoat hypertension.  I have encouraged her to check her blood pressure at home with cuff periodically and continue to follow with PCP.  Medication Adjustments/Labs and Tests Ordered: Current medicines are reviewed at length with the patient today.  Concerns regarding medicines are outlined above.   Tests Ordered: No orders of the defined types were placed in this encounter.   Medication Changes: No orders of the defined types were placed in this encounter.   Disposition:  Follow up  1 year.  Signed, Jonelle Sidle, MD, [facility] 03/18/2021 1:42 PM    [facility] at [facility] [address]  [place], [place], [place] [address] Phone: [phone]; Fax: [phone]

## 2021-03-18 NOTE — Patient Instructions (Signed)
Medication Instructions:   Your physician recommends that you continue on your current medications as directed. Please refer to the Current Medication list given to you today.  *If you need a refill on your cardiac medications before your next appointment, please call your pharmacy*   Lab Work: None today  If you have labs (blood work) drawn today and your tests are completely normal, you will receive your results only by: . MyChart Message (if you have MyChart) OR . A paper copy in the mail If you have any lab test that is abnormal or we need to change your treatment, we will call you to review the results.   Testing/Procedures: None today   Follow-Up: At [facility], you and your health needs are our priority.  As part of our continuing mission to provide you with exceptional heart care, we have created designated Provider Care Teams.  These Care Teams include your primary Cardiologist (physician) and Advanced Practice Providers (APPs -  Physician Assistants and Nurse Practitioners) who all work together to provide you with the care you need, when you need it.  We recommend signing up for the patient portal called "MyChart".  Sign up information is provided on this After Visit Summary.  MyChart is used to connect with patients for Virtual Visits (Telemedicine).  Patients are able to view lab/test results, encounter notes, upcoming appointments, etc.  Non-urgent messages can be sent to your provider as well.   To learn more about what you can do with MyChart, go to [email or web address].    Your next appointment:   12 month(s)  The format for your next appointment:   In Person  Provider:   Samuel McDowell, MD   Other Instructions None   

## 2021-03-27 ENCOUNTER — Ambulatory Visit: Payer: BC Managed Care – PPO | Admitting: Rheumatology

## 2021-04-18 ENCOUNTER — Ambulatory Visit: Payer: BC Managed Care – PPO | Admitting: Rheumatology

## 2021-04-23 ENCOUNTER — Ambulatory Visit: Payer: BC Managed Care – PPO | Admitting: Rheumatology

## 2021-04-26 ENCOUNTER — Other Ambulatory Visit: Payer: Self-pay | Admitting: Family Medicine

## 2021-07-12 ENCOUNTER — Emergency Department (HOSPITAL_COMMUNITY): Payer: BC Managed Care – PPO

## 2021-07-12 ENCOUNTER — Telehealth: Payer: Self-pay | Admitting: Emergency Medicine

## 2021-07-12 ENCOUNTER — Emergency Department (HOSPITAL_COMMUNITY)
Admission: EM | Admit: 2021-07-12 | Discharge: 2021-07-12 | Disposition: A | Discharge status: Home / Self Care | Payer: BC Managed Care – PPO | Attending: Emergency Medicine | Admitting: Emergency Medicine

## 2021-07-12 DIAGNOSIS — R0989 Other specified symptoms and signs involving the circulatory and respiratory systems: Secondary | ICD-10-CM | POA: Insufficient documentation

## 2021-07-12 DIAGNOSIS — R0602 Shortness of breath: Secondary | ICD-10-CM | POA: Diagnosis not present

## 2021-07-12 DIAGNOSIS — R000 Tachycardia, unspecified: Secondary | ICD-10-CM | POA: Diagnosis not present

## 2021-07-12 DIAGNOSIS — I10 Essential (primary) hypertension: Secondary | ICD-10-CM | POA: Insufficient documentation

## 2021-07-12 DIAGNOSIS — R0789 Other chest pain: Secondary | ICD-10-CM | POA: Diagnosis not present

## 2021-07-12 DIAGNOSIS — J45909 Unspecified asthma, uncomplicated: Secondary | ICD-10-CM | POA: Insufficient documentation

## 2021-07-12 DIAGNOSIS — F419 Anxiety disorder, unspecified: Secondary | ICD-10-CM | POA: Diagnosis not present

## 2021-07-12 MED ORDER — DEXAMETHASONE 4 MG PO TABS
10.0000 mg | ORAL_TABLET | Freq: Once | ORAL | Status: AC
  Administered 2021-07-12: 10 mg via ORAL
  Filled 2021-07-12: qty 2

## 2021-07-12 MED ORDER — ALBUTEROL SULFATE (2.5 MG/3ML) 0.083% IN NEBU
2.5000 mg | INHALATION_SOLUTION | Freq: Once | RESPIRATORY_TRACT | Status: AC
  Administered 2021-07-12: 2.5 mg via RESPIRATORY_TRACT
  Filled 2021-07-12: qty 3

## 2021-07-12 NOTE — ED Triage Notes (Signed)
Patient arrives from home with report of pill stuck in throat. Pt states she took half a zinc tablet around 1115 pm and since has been having trouble breathing and feels as though the pill is stuck.

## 2021-07-12 NOTE — ED Notes (Signed)
Patient still feels like she has gurgling when she takes a deep breath. Patient also states she did not feel any relief after having the neb treatment.

## 2021-07-12 NOTE — ED Notes (Signed)
Patient wast started on the neb treatment.

## 2021-07-12 NOTE — Telephone Encounter (Signed)
Patient last seen 11/2016 for asthma. I made patient aware that she is considered a new patient at this time and a consult is needed.   Patient stated that she was seen at ED after choking on a pill.  She is experiencing sob, chest tightness and wheezing since choking . Appt scheduled for 07/16/2021  at 3:30. Advised patient to go to ED or UC for sx worsen over the weekend.  Nothing further needed at this time.

## 2021-07-12 NOTE — ED Provider Notes (Signed)
[facility]EMERGENCY DEPT Provider Note   CSN: [id number] Arrival date & time: 07/12/21  0205     History Chief Complaint  Patient presents with   Swallowed Foreign Body    Victoria [name] Victoria [name] is a 60 y.o. female.  The history is provided by the patient.  Swallowed Foreign Body Victoria [name] is a 60 y.o. female who presents to the Emergency Department complaining of checked on a pill. She presents the emergency department for evaluation of difficulty breathing. Around 11 PM she was swallowing her evening medications. She had half a zinc tablet that she was trying to swallow but felt like it got stuck in her throat. She then took a big swig of water and attempted to swallow it but felt like she choked right when she was attempting to swallow the pill. She felt like he got stuck on the right side of her throat and down in her lungs. She feels wheezing just on the right side. She feels short of breath. No prior similar symptoms.     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     Family History  Problem Relation Age of Onset   Heart attack Father    Stroke Father      Social History   Tobacco Use   Smoking status: Never   Smokeless tobacco: Never  Vaping Use   Vaping Use: Never used  Substance Use Topics   Alcohol use: No    Alcohol/week: 0.0 standard drinks   Drug use: No    Home Medications Prior to Admission medications   Medication Sig Start Date End Date Taking? Authorizing Provider  Calcium Carb-Cholecalciferol (CALCIUM 1000 + D PO) Take by mouth 2 (two) times daily.    [provider]  cyclobenzaprine (FLEXERIL) 10 MG tablet Take 1 tablet (10 mg total) by mouth 3 (three) times daily as needed for muscle spasms. 06/30/19   [name], MD  DEXILANT 60 MG capsule TAKE 1 CAPSULE BY MOUTH EVERY DAY 02/03/20   Ladona Ridgel, Malena M, DO  dicyclomine (BENTYL) 10 MG capsule TAKE 1 CAPSULE(10 MG) BY MOUTH THREE TIMES DAILY 06/03/16   Setzer, Brand Males, NP  diltiazem (CARDIZEM) 30 MG tablet TAKE 1 TABLET(30 MG) BY MOUTH THREE TIMES DAILY 04/26/21   Jonelle Sidle, MD  fexofenadine (ALLEGRA) 180 MG tablet Take 180 mg by mouth daily. Patient not taking: Reported on 03/18/2021    [provider]  fluticasone (FLONASE) 50 MCG/ACT nasal spray Place 2 sprays into both nostrils daily. 08/29/20   Laroy Apple M, DO  levalbuterol [facility] HFA) 45 MCG/ACT inhaler  Inhale 2 puffs into the lungs every 6 (six) hours as needed for wheezing. Patient not taking: Reported on 03/18/2021 06/29/19   [name], MD  Magnesium 400 MG TABS Take 1 tablet by mouth daily.    [provider]  meloxicam (MOBIC) 7.5 MG tablet TAKE 1 TABLET(7.5 MG) BY MOUTH DAILY 11/06/20   Ladona Ridgel, Malena M, DO  metroNIDAZOLE (METROGEL) 1 % gel APPLY EXTERNALLY TO FACE EVERY NIGHT AT qhs prn. Needs appt for more refills. 08/29/20   Ladona Ridgel, Malena M, DO  mometasone (NASONEX) 50 MCG/ACT nasal spray SHAKE LQ AND U 2 SPRAYS IEN QD Patient not taking: Reported on 03/18/2021 06/03/18   [provider]  montelukast (SINGULAIR) 10 MG tablet TAKE 1 TABLET(10 MG)  BY MOUTH AT BEDTIME Patient not taking: Reported on 03/18/2021 11/23/20   Laroy Apple M, DO  Multiple Vitamin (MULTIVITAMIN) tablet Take 1 tablet by mouth daily.    [provider]  polyethylene glycol (MIRALAX / GLYCOLAX) packet Take 17 g by mouth daily.    [provider]  PREVIDENT 5000 SENSITIVE 1.1-5 % PSTE  05/26/18   [provider]  psyllium (METAMUCIL) 58.6 % powder Take 1 packet by mouth daily.     [provider]  RESTASIS 0.05 % ophthalmic emulsion INSTILL 1 DROP IN [facility] EYE Q 12 H 12/25/15   [provider]    Allergies    Sulfamethoxazole-trimethoprim  Review of Systems   Review of Systems  All other systems reviewed and are negative.  Physical Exam Updated Vital Signs BP (!) 143/91   Pulse (!) 101   Temp 98.4 F (36.9 C) (Oral)   Resp (!) 25   SpO2 96%   Physical Exam Vitals and nursing note reviewed.  Constitutional:      Appearance: She is well-developed.  HENT:     Head: Normocephalic and atraumatic.     Mouth/Throat:     Pharynx: No posterior oropharyngeal erythema.  Cardiovascular:     Rate and Rhythm: Regular rhythm. Tachycardia present.     Heart sounds: No murmur heard. Pulmonary:     Effort: Pulmonary effort is normal. No respiratory distress.     Breath sounds: No stridor.     Comments: Occasional transmitted upper airway noises  Abdominal:     Palpations: Abdomen is soft.     Tenderness: There is no abdominal tenderness. There is no guarding or rebound.  Musculoskeletal:        General: No tenderness.  Skin:    General: Skin is warm and dry.  Neurological:     Mental Status: She is alert and oriented to person, place, and time.  Psychiatric:     Comments: Anxious appearing    ED Results / Procedures / Treatments   Labs (all labs ordered are listed, but only abnormal results are displayed) Labs Reviewed - No data to display  EKG None  Radiology DG Neck Soft Tissue  Result Date:  07/12/2021 CLINICAL DATA:  60 year old female reports choking on a pill, with sensation of pill stuck in throat. EXAM: NECK SOFT TISSUES - 1+ VIEW COMPARISON:  No priors. FINDINGS: There is no evidence of retropharyngeal soft tissue swelling or epiglottic enlargement. The cervical airway is unremarkable. Two small densities are noted adjacent to the posterior aspect of the airway overlying the anterior aspect of the esophagus on the lateral projection at approximately the level of the cords. IMPRESSION: 1. Small densities projecting over the neck noted only on the lateral projection  at approximately the level of vocal cords, favored to be atherosclerotic calcifications near the level of the carotid bifurcation. No definite acute findings are otherwise noted. Specifically, no pill shaped radiopaque foreign body. Electronically Signed   By: Trudie Reed M.D.   On: 07/12/2021 06:08   DG Chest 2 View  Result Date: 07/12/2021 CLINICAL DATA:  60 year old female reports choking on a pill yesterday. EXAM: CHEST - 2 VIEW COMPARISON:  Chest x-ray 09/30/2016. FINDINGS: Lung volumes are normal. No consolidative airspace disease. No pleural effusions. No pneumothorax. No pulmonary nodule or mass noted. Pulmonary vasculature and the cardiomediastinal silhouette are within normal limits. No unexpected radiopaque foreign body. IMPRESSION: 1.  No radiographic evidence of acute cardiopulmonary disease. Electronically Signed   By: Trudie Reed M.D.   On: 07/12/2021 06:02    Procedures Procedures   Medications Ordered in ED Medications  dexamethasone (DECADRON) tablet 10 mg (has no administration in time range)  albuterol (PROVENTIL) (2.5 MG/3ML) 0.083% nebulizer solution 2.5 mg (2.5 mg Nebulization Given 07/12/21 0507)    ED Course  I have reviewed the triage vital signs and the nursing notes.  Pertinent labs & imaging results that were available during my care of the patient were reviewed by me and considered  in my medical decision making (see chart for details).    MDM Rules/Calculators/A&P                          patient here for evaluation of right-sided chest discomfort and difficulty breathing after having a choking episode and feeling like a zinc pill got temporarily lodged in her throat. She no longer has a foreign body sensation in her throat but has persistent sensation like she cannot breathe well but there is something in her lungs. She does have transmitted upper airway sounds on her lung exam without respiratory distress. She is anxious appearing. No stridor. No evidence of foreign body on images or evaluation. She was treated with a one-time dose of Decadron for sensation of throat discomfort. Offered lidocaine and she declines. Low index of suspicion of true aspiration event or bronchial foreign body. Discussed pulmonary follow-up as needed as well as return precautions if she has new or concerning symptoms.  Final Clinical Impression(s) / ED Diagnoses Final diagnoses:  Choking episode    Rx / DC Orders ED Discharge Orders     None        Tilden Fossa, MD 07/12/21 [phone]

## 2021-07-12 NOTE — Telephone Encounter (Signed)
Pt spent night at [facility] ED due to choking episode. Did not find the pill that she swallowed. Was advised f/u with RB but pt hasnt been seen since 2018. Pt has questions regarding "noise coming from the back of her throat." She is rattley even while speakig on the phone. Chest is tight as well. Was given 10 mg steroid pill to help with inflammation and thinks this may be just a reaction to aspirating the water. Pt is severely SOB while speaking on the phone. Pt has done nebulizer treatment but it has not helped her much. Worried this may develop into something worse. Please advise.

## 2021-07-12 NOTE — ED Notes (Signed)
Patient was able to tolerate water and graham crackers.

## 2021-07-16 ENCOUNTER — Institutional Professional Consult (permissible substitution): Payer: BC Managed Care – PPO | Admitting: Pulmonary Disease

## 2021-08-23 DIAGNOSIS — Z01419 Encounter for gynecological examination (general) (routine) without abnormal findings: Secondary | ICD-10-CM | POA: Diagnosis not present

## 2021-08-23 DIAGNOSIS — Z6825 Body mass index (BMI) 25.0-25.9, adult: Secondary | ICD-10-CM | POA: Diagnosis not present

## 2021-08-23 DIAGNOSIS — Z1231 Encounter for screening mammogram for malignant neoplasm of breast: Secondary | ICD-10-CM | POA: Diagnosis not present

## 2021-09-16 DIAGNOSIS — L290 Pruritus ani: Secondary | ICD-10-CM | POA: Diagnosis not present

## 2021-09-16 DIAGNOSIS — B3789 Other sites of candidiasis: Secondary | ICD-10-CM | POA: Diagnosis not present

## 2021-09-16 DIAGNOSIS — K219 Gastro-esophageal reflux disease without esophagitis: Secondary | ICD-10-CM | POA: Diagnosis not present

## 2022-02-13 ENCOUNTER — Other Ambulatory Visit: Payer: Self-pay | Admitting: Cardiology

## 2022-05-28 DIAGNOSIS — R140 Abdominal distension (gaseous): Secondary | ICD-10-CM | POA: Diagnosis not present

## 2022-05-28 DIAGNOSIS — R110 Nausea: Secondary | ICD-10-CM | POA: Diagnosis not present

## 2022-05-28 DIAGNOSIS — K219 Gastro-esophageal reflux disease without esophagitis: Secondary | ICD-10-CM | POA: Diagnosis not present

## 2022-08-28 DIAGNOSIS — Z13820 Encounter for screening for osteoporosis: Secondary | ICD-10-CM | POA: Diagnosis not present

## 2022-08-28 DIAGNOSIS — Z13228 Encounter for screening for other metabolic disorders: Secondary | ICD-10-CM | POA: Diagnosis not present

## 2022-08-28 DIAGNOSIS — Z1329 Encounter for screening for other suspected endocrine disorder: Secondary | ICD-10-CM | POA: Diagnosis not present

## 2022-08-28 DIAGNOSIS — Z6825 Body mass index (BMI) 25.0-25.9, adult: Secondary | ICD-10-CM | POA: Diagnosis not present

## 2022-08-28 DIAGNOSIS — Z1231 Encounter for screening mammogram for malignant neoplasm of breast: Secondary | ICD-10-CM | POA: Diagnosis not present

## 2022-08-28 DIAGNOSIS — Z130 Encounter for screening for diseases of the blood and blood-forming organs and certain disorders involving the immune mechanism: Secondary | ICD-10-CM | POA: Diagnosis not present

## 2022-08-28 DIAGNOSIS — Z01419 Encounter for gynecological examination (general) (routine) without abnormal findings: Secondary | ICD-10-CM | POA: Diagnosis not present

## 2022-08-28 DIAGNOSIS — Z131 Encounter for screening for diabetes mellitus: Secondary | ICD-10-CM | POA: Diagnosis not present

## 2022-10-30 ENCOUNTER — Encounter: Payer: Self-pay | Admitting: Cardiology

## 2022-11-21 DIAGNOSIS — M25511 Pain in right shoulder: Secondary | ICD-10-CM | POA: Diagnosis not present

## 2023-01-18 ENCOUNTER — Other Ambulatory Visit: Payer: Self-pay | Admitting: Cardiology

## 2023-05-14 ENCOUNTER — Other Ambulatory Visit: Payer: Self-pay | Admitting: Cardiology

## 2023-06-03 ENCOUNTER — Encounter: Payer: Self-pay | Admitting: Cardiology

## 2023-06-03 ENCOUNTER — Ambulatory Visit: Payer: BC Managed Care – PPO | Attending: Cardiology | Admitting: Cardiology

## 2023-06-03 VITALS — BP 118/82 | HR 103 | Ht 63.0 in | Wt 146.6 lb

## 2023-06-03 DIAGNOSIS — R002 Palpitations: Secondary | ICD-10-CM

## 2023-06-03 MED ORDER — DILTIAZEM HCL 30 MG PO TABS: 30.0000 mg | ORAL_TABLET | Freq: Three times a day (TID) | ORAL | 3 refills | Status: DC

## 2023-06-03 NOTE — Patient Instructions (Addendum)
Medication Instructions:  Your physician recommends that you continue on your current medications as directed. Please refer to the Current Medication list given to you today.  Labwork: none  Testing/Procedures: none  Follow-Up: Your physician recommends that you schedule a follow-up appointment in: 2 years. You will receive a reminder call in about 20 months reminding you to schedule your appointment. If you don't receive this call, please contact our office.  Any Other Special Instructions Will Be Listed Below (If Applicable).  If you need a refill on your cardiac medications before your next appointment, please call your pharmacy.

## 2023-06-03 NOTE — Progress Notes (Signed)
Cardiology Office Note  Date: 06/03/2023   ID: Amiley, Cuccia 1961/07/20, MRN [id number]  History of Present Illness: Victoria [name] is a 62 y.o. female last seen in July 2022.  She is here for a routine visit.  Reports no significant palpitations and continues on short acting Cardizem as before.  On most days she takes this twice a day instead of 3 times a day.  She remains active, walks up to 5 miles a day and reports no exertional symptoms.  She does report intermittent problems with dizziness/vertigo.  Seems to bother her mostly when she gets out of bed and turns her head.  I did tell her about possible PT for treatment of benign positional vertigo, she states that her symptoms are not to the point that she wants to pursue that as yet.  ECG today shows sinus tachycardia.   Physical Exam: VS:  BP 118/82   Pulse (!) 103   Ht 5\' 3"  (1.6 m)   Wt 146 lb 9.6 oz (66.5 kg)   SpO2 97%   BMI 25.97 kg/m , BMI Body mass index is 25.97 kg/m.  Wt Readings from Last 3 Encounters:  06/03/23 146 lb 9.6 oz (66.5 kg)  03/18/21 147 lb (66.7 kg)  05/31/20 138 lb (62.6 kg)    General: Patient appears comfortable at rest. HEENT: Conjunctiva and lids normal. Neck: Supple, no elevated JVP or carotid bruits. Lungs: Clear to auscultation, nonlabored breathing at rest. Cardiac: Regular rate and rhythm, no S3 or significant systolic murmur.  ECG:  An ECG dated 05/31/2020 was personally reviewed today and demonstrated:  Sinus rhythm.  Labwork:  December 2023: TSH 3.1, hemoglobin 14, platelets 247, BUN 17, creatinine 0.96, potassium 4.5, AST 20, ALT 22, hemoglobin A1c 5.5%  Other Studies Reviewed Today:  No interval cardiac testing for review today.  Assessment and Plan:  1.  History of palpitations with previously documented PVCs corresponding to symptoms and also a more remote history of PSVT.  She does not describe any progressive symptoms and continues on Cardizem 30 mg p.o. twice daily  to 3 times daily.  She has not wanted to pursue Cardizem CD formulation as yet.  Refill provided.  Continue observation.   2.  Intermittent dizziness and vertigo, possibly benign positional vertigo.  We talked about possible PT referral if necessary.  Disposition:  Follow up  2 years at patient request.  Signed, Jonelle Sidle, M.D., F.A.C.C. [facility] at [facility]

## 2023-07-29 ENCOUNTER — Other Ambulatory Visit (HOSPITAL_COMMUNITY): Payer: Self-pay | Admitting: Gastroenterology

## 2023-07-29 DIAGNOSIS — R110 Nausea: Secondary | ICD-10-CM

## 2023-07-31 ENCOUNTER — Other Ambulatory Visit: Payer: Self-pay | Admitting: Gastroenterology

## 2023-07-31 DIAGNOSIS — R110 Nausea: Secondary | ICD-10-CM

## 2023-09-15 ENCOUNTER — Other Ambulatory Visit: Payer: Self-pay

## 2023-09-15 ENCOUNTER — Ambulatory Visit: Payer: BC Managed Care – PPO | Admitting: Allergy and Immunology

## 2023-09-15 ENCOUNTER — Encounter: Payer: Self-pay | Admitting: Allergy and Immunology

## 2023-09-15 VITALS — BP 136/86 | HR 99 | Temp 98.6°F | Resp 16 | Ht 62.6 in | Wt 139.7 lb

## 2023-09-15 DIAGNOSIS — T781XXD Other adverse food reactions, not elsewhere classified, subsequent encounter: Secondary | ICD-10-CM | POA: Diagnosis not present

## 2023-09-15 DIAGNOSIS — T781XXA Other adverse food reactions, not elsewhere classified, initial encounter: Secondary | ICD-10-CM

## 2023-09-15 DIAGNOSIS — R110 Nausea: Secondary | ICD-10-CM

## 2023-09-15 DIAGNOSIS — R1084 Generalized abdominal pain: Secondary | ICD-10-CM

## 2023-09-15 DIAGNOSIS — R140 Abdominal distension (gaseous): Secondary | ICD-10-CM

## 2023-09-15 NOTE — Patient Instructions (Addendum)
  1.  Allergen avoidance measures???  2. Blood - celiac screen with IgA, CBC w/D, CMP, alpha-gal panel  3. Stool antigen for H. Pylori  4. Can try a gluten free diet for 3-4 weeks  5. Can try a cow milk free diet for 3-4 weeks  6. Minimize any caffeine / chocolate consumption  7. Further evaluation and treatment???  8. Contact clinic with update in 6-8 weeks

## 2023-09-15 NOTE — Progress Notes (Signed)
 [place] - [place] - [place] - [place] - [place]    NEW PATIENT NOTE  Referring Provider: No ref. provider found Primary Provider: Pcp, No Date of office visit: 09/15/2023    Subjective:   Chief Complaint:  Victoria [name] (DOB: 08/21/1961) is a 63 y.o. female who presents to the clinic on 09/15/2023 with a chief complaint of Allergic Reaction (Wants to rule out food allergies. Nausea to unknown foods. Possible dairy and tomatoes. Also has issues with bloating. ) .     HPI: Victoria [name] presents to this clinic in evaluation of possible food hypersensitivity.  Her history dates back to about 2 years ago at which point time she developed significant bloating and gas after eating.  Then approximately 6 months ago she developed some intermittent nausea along with her bloating after eating and sometimes this would occur for hours although it could be very intermittent and sporadic.  She does have a history of IBS with multiple bowel movements per day but without any frank diarrhea.  Sometimes she gets colon burning and she will take a dicyclomine .  She started a probiotic about 3 months ago which has definitely helped some of her issues but she still has nausea and bloating.  She does not really note any obvious food giving rise to this problem.  She did attempt to go dairy free at about 75% efficiency and this may not have really helped very much.  She went tomato free as well which really did not help very much as well.  Her gastroenterologist, Dr. Phebe, has performed a colonoscopy and upper endoscopy in 2022 but that is the extent of the evaluation that she has had regarding her GI team.  She does have a history of reflux that is under excellent control while using Dexilant  on a consistent basis.  She does not really have any other associated atopic symptoms or atopic disease.  Past Medical History:  Diagnosis Date   Asthma    GERD (gastroesophageal reflux disease)    Headache     Hypertension    Palpitations    PSVT (paroxysmal supraventricular tachycardia) (HCC)    Scoliosis    Vertigo     Past Surgical History:  Procedure Laterality Date   COLONOSCOPY  01/29/2021    Allergies as of 09/15/2023       Reactions   Sulfamethoxazole-trimethoprim Anaphylaxis   Face, mouth, gums, swelling and on fire        Medication List    ALPRAZolam  0.5 MG tablet Commonly known as: XANAX  Take 0.5 mg by mouth at bedtime as needed.   AZO Cranberry 250-30 MG Tabs 2 tablets daily as needed.   CALCIUM  1000 + D PO Take by mouth 2 (two) times daily.   cyclobenzaprine  10 MG tablet Commonly known as: FLEXERIL  Take 1 tablet (10 mg total) by mouth 3 (three) times daily as needed for muscle spasms.   Dexilant  60 MG capsule Generic drug: dexlansoprazole  TAKE 1 CAPSULE BY MOUTH EVERY DAY   dicyclomine  10 MG capsule Commonly known as: BENTYL  TAKE 1 CAPSULE(10 MG) BY MOUTH THREE TIMES DAILY   diltiazem  30 MG tablet Commonly known as: CARDIZEM  Take 1 tablet (30 mg total) by mouth 3 (three) times daily.   fluticasone  50 MCG/ACT nasal spray Commonly known as: FLONASE  Place 2 sprays into both nostrils daily.   Magnesium 400 MG Tabs Take 1 tablet by mouth daily.   metroNIDAZOLE  1 % gel Commonly known as: METROGEL  APPLY EXTERNALLY TO FACE EVERY NIGHT  AT qhs prn. Needs appt for more refills.   multivitamin tablet Take 1 tablet by mouth daily.   polyethylene glycol 17 g packet Commonly known as: MIRALAX / GLYCOLAX Take 17 g by mouth daily.   PreviDent 5000 Sensitive 1.1-5 % Pste Generic drug: Sod Fluoride-Potassium Nitrate   psyllium 58.6 % powder Commonly known as: METAMUCIL Take 1 packet by mouth daily.   Restasis  0.05 % ophthalmic emulsion Generic drug: cycloSPORINE  INSTILL 1 DROP IN EACH EYE Q 12 H    Review of systems negative except as noted in HPI / PMHx or noted below:  Review of Systems  Constitutional: Negative.   HENT: Negative.    Eyes:  Negative.   Respiratory: Negative.    Cardiovascular: Negative.   Gastrointestinal: Negative.   Genitourinary: Negative.   Musculoskeletal: Negative.   Skin: Negative.   Neurological: Negative.   Endo/Heme/Allergies: Negative.   Psychiatric/Behavioral: Negative.      Family History  Problem Relation Age of Onset   Urticaria Mother    Allergic rhinitis Mother    Allergic rhinitis Father    Urticaria Father    Heart attack Father    Stroke Father     Social History   Socioeconomic History   Marital status: Married    Spouse name: Not on file   Number of children: Not on file   Years of education: Not on file   Highest education level: Not on file  Occupational History   Not on file  Tobacco Use   Smoking status: Never    Passive exposure: Past   Smokeless tobacco: Never  Vaping Use   Vaping status: Never Used  Substance and Sexual Activity   Alcohol use: No    Alcohol/week: 0.0 standard drinks of alcohol   Drug use: No   Sexual activity: Yes    Partners: Male  Other Topics Concern   Not on file  Social History Narrative   Not on file   Objective:   Vitals:   09/15/23 1355  BP: 136/86  Pulse: 99  Resp: 16  Temp: 98.6 F (37 C)  SpO2: 99%   Height: 5' 2.6 (159 cm) Weight: 139 lb 11.2 oz (63.4 kg)  Physical Exam Constitutional:      Appearance: She is not diaphoretic.  HENT:     Head: Normocephalic.     Right Ear: Tympanic membrane, ear canal and external ear normal.     Left Ear: Tympanic membrane, ear canal and external ear normal.     Nose: Nose normal. No mucosal edema or rhinorrhea.     Mouth/Throat:     Pharynx: Uvula midline. No oropharyngeal exudate.  Eyes:     Conjunctiva/sclera: Conjunctivae normal.  Neck:     Thyroid : No thyromegaly.     Trachea: Trachea normal. No tracheal tenderness or tracheal deviation.  Cardiovascular:     Rate and Rhythm: Normal rate and regular rhythm.     Heart sounds: Normal heart sounds, S1 normal and S2  normal. No murmur heard. Pulmonary:     Effort: No respiratory distress.     Breath sounds: Normal breath sounds. No stridor. No wheezing or rales.  Lymphadenopathy:     Head:     Right side of head: No tonsillar adenopathy.     Left side of head: No tonsillar adenopathy.     Cervical: No cervical adenopathy.  Skin:    Findings: No erythema or rash.     Nails: There is no clubbing.  Neurological:  Mental Status: She is alert.     Diagnostics: Allergy  skin tests were performed.  She did not demonstrate any hypersensitivity against a screening panel of foods.  Assessment and Plan:    1. Abdominal bloating   2. Generalized abdominal pain   3. Nausea   4. Adverse food reaction, initial encounter    1.  Allergen avoidance measures???  2. Blood - celiac screen with IgA, CBC w/D, CMP, alpha-gal panel  3. Stool antigen for H. Pylori  4. Can try a gluten free diet for 3-4 weeks  5. Can try a cow milk free diet for 3-4 weeks  6. Minimize any caffeine / chocolate consumption  7. Further evaluation and treatment???  8. Contact clinic with update in 6-8 weeks  Victoria [name] appears to have some degree of gut dysfunction and uncoordinated peristaltic activity and it is not entirely clear what triggers are setting off this issue.  We will have her perform some food restriction and have her try a complete gluten-free diet for a few weeks and then she can try complete cow milk free diet for several weeks and make a determination about whether or not either 1 of these 2 food restrictions gives rise to improvement.  Will check a few blood tests as noted above and a stool antigen study for Helicobacter pylori and further investigation of her issue.  Based upon her response to empiric restrictive diet and the results of her diagnostic studies will make a determination about further evaluation and treatment.  Victoria DOROTHA Denis, MD Allergy  / Immunology [facility]

## 2023-09-16 ENCOUNTER — Encounter: Payer: Self-pay | Admitting: Allergy and Immunology

## 2023-09-16 DIAGNOSIS — T781XXA Other adverse food reactions, not elsewhere classified, initial encounter: Secondary | ICD-10-CM | POA: Diagnosis not present

## 2023-09-16 DIAGNOSIS — R140 Abdominal distension (gaseous): Secondary | ICD-10-CM | POA: Diagnosis not present

## 2023-09-16 DIAGNOSIS — R1084 Generalized abdominal pain: Secondary | ICD-10-CM | POA: Diagnosis not present

## 2023-09-16 DIAGNOSIS — R110 Nausea: Secondary | ICD-10-CM | POA: Diagnosis not present

## 2023-09-17 LAB — COMPREHENSIVE METABOLIC PANEL
ALT: 25 [IU]/L (ref 0–32)
AST: 20 [IU]/L (ref 0–40)
Albumin: 4.7 g/dL (ref 3.8–4.9)
Alkaline Phosphatase: 80 [IU]/L (ref 44–121)
BUN/Creatinine Ratio: 15 (ref 12–28)
BUN: 10 mg/dL (ref 8–27)
Bilirubin Total: 0.4 mg/dL (ref 0.0–1.2)
CO2: 26 mmol/L (ref 20–29)
Calcium: 9.5 mg/dL (ref 8.7–10.3)
Chloride: 101 mmol/L (ref 96–106)
Creatinine, Ser: 0.66 mg/dL (ref 0.57–1.00)
Globulin, Total: 2.2 g/dL (ref 1.5–4.5)
Glucose: 98 mg/dL (ref 70–99)
Potassium: 4.3 mmol/L (ref 3.5–5.2)
Sodium: 141 mmol/L (ref 134–144)
Total Protein: 6.9 g/dL (ref 6.0–8.5)
eGFR: 100 mL/min/{1.73_m2} (ref >59)

## 2023-09-17 LAB — CBC WITH DIFFERENTIAL/PLATELET
Basophils Absolute: 0 10*3/uL (ref 0.0–0.2)
Basos: 0 %
EOS (ABSOLUTE): 0 10*3/uL (ref 0.0–0.4)
Eos: 1 %
Hematocrit: 45.2 % (ref 34.0–46.6)
Hemoglobin: 15.4 g/dL (ref 11.1–15.9)
Immature Grans (Abs): 0 10*3/uL (ref 0.0–0.1)
Immature Granulocytes: 0 %
Lymphocytes Absolute: 1.7 10*3/uL (ref 0.7–3.1)
Lymphs: 25 %
MCH: 33.8 pg — ABNORMAL HIGH (ref 26.6–33.0)
MCHC: 34.1 g/dL (ref 31.5–35.7)
MCV: 99 fL — ABNORMAL HIGH (ref 79–97)
Monocytes Absolute: 0.6 10*3/uL (ref 0.1–0.9)
Monocytes: 9 %
Neutrophils Absolute: 4.3 10*3/uL (ref 1.4–7.0)
Neutrophils: 65 %
Platelets: 292 10*3/uL (ref 150–450)
RBC: 4.55 x10E6/uL (ref 3.77–5.28)
RDW: 11.4 % — ABNORMAL LOW (ref 11.7–15.4)
WBC: 6.7 10*3/uL (ref 3.4–10.8)

## 2023-09-17 LAB — ALPHA-GAL PANEL: IgE (Immunoglobulin E), Serum: 60 [IU]/mL (ref 6–495)

## 2023-09-17 LAB — CELIAC DISEASE PANEL
IgA/Immunoglobulin A, Serum: 207 mg/dL (ref 87–352)
Transglutaminase IgA: <2 U/mL (ref 0–3)

## 2023-09-17 LAB — IGA: IgA/Immunoglobulin A, Serum: 194 mg/dL (ref 87–352)

## 2023-09-18 DIAGNOSIS — Z6824 Body mass index (BMI) 24.0-24.9, adult: Secondary | ICD-10-CM | POA: Diagnosis not present

## 2023-09-18 DIAGNOSIS — Z131 Encounter for screening for diabetes mellitus: Secondary | ICD-10-CM | POA: Diagnosis not present

## 2023-09-18 DIAGNOSIS — Z13220 Encounter for screening for lipoid disorders: Secondary | ICD-10-CM | POA: Diagnosis not present

## 2023-09-18 DIAGNOSIS — Z13228 Encounter for screening for other metabolic disorders: Secondary | ICD-10-CM | POA: Diagnosis not present

## 2023-09-18 DIAGNOSIS — Z124 Encounter for screening for malignant neoplasm of cervix: Secondary | ICD-10-CM | POA: Diagnosis not present

## 2023-09-18 DIAGNOSIS — Z130 Encounter for screening for diseases of the blood and blood-forming organs and certain disorders involving the immune mechanism: Secondary | ICD-10-CM | POA: Diagnosis not present

## 2023-09-18 DIAGNOSIS — Z1231 Encounter for screening mammogram for malignant neoplasm of breast: Secondary | ICD-10-CM | POA: Diagnosis not present

## 2023-09-18 DIAGNOSIS — Z1329 Encounter for screening for other suspected endocrine disorder: Secondary | ICD-10-CM | POA: Diagnosis not present

## 2023-09-18 DIAGNOSIS — Z01419 Encounter for gynecological examination (general) (routine) without abnormal findings: Secondary | ICD-10-CM | POA: Diagnosis not present

## 2023-09-18 LAB — H. PYLORI ANTIGEN, STOOL: H pylori Ag, Stl: NEGATIVE

## 2023-09-21 ENCOUNTER — Telehealth: Payer: Self-pay

## 2023-09-21 NOTE — Telephone Encounter (Signed)
 Pt was wondering if with the blood work you tested her to see if she has enough digestive enzymes ?  If everything on testing is negative she said you stated theres other steps you can still do to help her?

## 2023-09-21 NOTE — Telephone Encounter (Signed)
 Pt informed and stated she will try that now and see how she does

## 2023-09-22 ENCOUNTER — Ambulatory Visit: Payer: BC Managed Care – PPO | Admitting: Family Medicine

## 2023-09-29 DIAGNOSIS — H11433 Conjunctival hyperemia, bilateral: Secondary | ICD-10-CM | POA: Diagnosis not present

## 2023-09-30 ENCOUNTER — Ambulatory Visit: Payer: BC Managed Care – PPO | Admitting: Family Medicine

## 2023-10-08 DIAGNOSIS — H04123 Dry eye syndrome of bilateral lacrimal glands: Secondary | ICD-10-CM | POA: Diagnosis not present

## 2023-10-13 ENCOUNTER — Encounter: Payer: Self-pay | Admitting: Internal Medicine

## 2023-10-13 ENCOUNTER — Ambulatory Visit: Payer: BC Managed Care – PPO | Admitting: Internal Medicine

## 2023-10-13 ENCOUNTER — Other Ambulatory Visit: Payer: BC Managed Care – PPO

## 2023-10-13 VITALS — BP 128/84 | HR 84 | Ht 63.0 in | Wt 138.4 lb

## 2023-10-13 DIAGNOSIS — R109 Unspecified abdominal pain: Secondary | ICD-10-CM | POA: Diagnosis not present

## 2023-10-13 DIAGNOSIS — K219 Gastro-esophageal reflux disease without esophagitis: Secondary | ICD-10-CM | POA: Diagnosis not present

## 2023-10-13 DIAGNOSIS — R140 Abdominal distension (gaseous): Secondary | ICD-10-CM

## 2023-10-13 DIAGNOSIS — R1084 Generalized abdominal pain: Secondary | ICD-10-CM

## 2023-10-13 DIAGNOSIS — K559 Vascular disorder of intestine, unspecified: Secondary | ICD-10-CM

## 2023-10-13 MED ORDER — DICYCLOMINE HCL 20 MG PO TABS: 20.0000 mg | ORAL_TABLET | Freq: Three times a day (TID) | ORAL | 11 refills | Status: DC

## 2023-10-13 NOTE — Progress Notes (Signed)
 Victoria [name]

## 2023-10-13 NOTE — Progress Notes (Signed)
 Patient ID: Victoria [name], female   DOB: 1961-07-03, 63 y.o.   MRN: [id number] HPI: Discussed the use of AI scribe software for clinical note transcription with the patient, who gave verbal consent to proceed.  Victoria [name] is a 63 year old female with diverticulosis who presents with gastrointestinal symptoms and recent severe abdominal pain with rectal bleeding. She is accompanied by her husband. She was referred by Dr. Tomlin for gastrointestinal evaluation.  She has GI history with Dr. Golda and most recently Dr. Rollin.  She had EGD/colon with Dr. Rollin in May 2022.  Colonoscopy May 2022 with a good prep to the cecum revealed scattered sigmoid diverticulosis and was otherwise normal.  Recommend repeat colonoscopy in 10 years. EGD performed the same day was reportedly normal although I do not have this formal report.  She recently experienced a severe episode of abdominal pain that began on last Friday night, waking her with excruciating pain and resulting in diarrhea and the passage of blood and mucus. She described the pain as feeling like being hit with a bat. She took seven-year-old Flagyl , which she believes helped alleviate the symptoms. The pain was accompanied by a significant amount of red blood and mucus on Saturday, and the symptoms gradually resolved over the following days.  She has been experiencing gastrointestinal symptoms for the past one to two years, with a notable worsening over the past six months. She reports frequent bowel movements, averaging four to five times daily, which are mostly soft and sometimes thin during exacerbations. During exacerbations, she experiences burning sensations in her colon, chills, bloating, and gas. The bloating is severe, causing her abdomen to protrude significantly. She experiences mild cramping and significant burning, which is alleviated by dicyclomine . Her weight fluctuates by two to four pounds during these episodes, which last one to two  days.  She has tracked her food intake and bowel movements for the past four to five months, noting 16 great days, six mild days, and four rotten days out of the last 26 days. She has undergone extensive testing, including skin testing for food allergies, stool sample for H. pylori, and blood tests for celiac disease, all of which were negative. She has tried elimination diets, including dairy and gluten, and a low FODMAP diet, without significant improvement.  She takes Dexilant  for GERD, which she believes is effective, and magnesium glycinate, which she reduced from 400 mg to 200 mg due to concerns about gastrointestinal effects. She also takes Miralax several times a week to aid bowel movements, as she finds it difficult to pass stools without it. She takes Metamucil at night to help with stool formation.  Her family history includes her mother having gastrointestinal issues and undergoing abdominal surgery. Her mother also passed away suddenly from a stomach rupture.     Past Medical History:  Diagnosis Date   Asthma    Diverticulosis    GERD (gastroesophageal reflux disease)    Headache    Hypertension    IBS (irritable bowel syndrome)    Palpitations    PSVT (paroxysmal supraventricular tachycardia) (HCC)    Scoliosis    Vertigo     Past Surgical History:  Procedure Laterality Date   abdominal hernia     ? umbilical   COLONOSCOPY  01/29/2021   SHOULDER ARTHROSCOPY Right    TONSILLECTOMY     VEIN SURGERY Bilateral 1994    Outpatient Medications Prior to Visit  Medication Sig Dispense Refill   ALPRAZolam  (XANAX ) 0.5  MG tablet Take 0.5 mg by mouth at bedtime as needed.     AMBULATORY NON FORMULARY MEDICATION Ultra Flora IB probiotic Take 1 capsule by mouth daily     Calcium  Carb-Cholecalciferol (CALCIUM  1000 + D PO) Take by mouth 2 (two) times daily.     Cranberry-Vitamin C-Probiotic (AZO CRANBERRY) 250-30 MG TABS 2 tablets daily as needed.     cyclobenzaprine  (FLEXERIL ) 10  MG tablet Take 1 tablet (10 mg total) by mouth 3 (three) times daily as needed for muscle spasms. 270 tablet 1   DEXILANT  60 MG capsule TAKE 1 CAPSULE BY MOUTH EVERY DAY 90 capsule 0   diltiazem  (CARDIZEM ) 30 MG tablet Take 1 tablet (30 mg total) by mouth 3 (three) times daily. 270 tablet 3   fluticasone  (FLONASE ) 50 MCG/ACT nasal spray Place 2 sprays into both nostrils daily. 16 g 3   Magnesium 200 MG TABS Take 1 tablet by mouth daily.     metroNIDAZOLE  (METROGEL ) 1 % gel APPLY EXTERNALLY TO FACE EVERY NIGHT AT qhs prn. Needs appt for more refills. 55 g 0   Multiple Vitamin (MULTIVITAMIN) tablet Take 1 tablet by mouth daily.     polyethylene glycol (MIRALAX / GLYCOLAX) packet Take 17 g by mouth daily.     PREVIDENT 5000 SENSITIVE 1.1-5 % PSTE   5   psyllium (METAMUCIL) 58.6 % powder Take 1 packet by mouth daily.      RESTASIS  0.05 % ophthalmic emulsion INSTILL 1 DROP IN EACH EYE Q 12 H  3   dicyclomine  (BENTYL ) 10 MG capsule TAKE 1 CAPSULE(10 MG) BY MOUTH THREE TIMES DAILY 90 capsule 3   COVID-19 At Home Antigen Test (BINAXNOW COVID-19 AG HOME TEST) KIT 1 kit by Other route as needed.     Magnesium 400 MG TABS Take 1 tablet by mouth daily.     No facility-administered medications prior to visit.    Allergies  Allergen Reactions   Sulfamethoxazole-Trimethoprim Anaphylaxis    Face, mouth, gums, swelling and on fire    Family History  Problem Relation Age of Onset   Urticaria Mother    Allergic rhinitis Mother    Hypertension Mother    GER disease Mother    Hiatal hernia Mother    Gallbladder disease Mother    Heart attack Mother    Heart disease Mother    Allergic rhinitis Father    Urticaria Father    Heart attack Father    Stroke Father    Scoliosis Father    Heart disease Father    Cancer Maternal Grandmother        type unknown   Heart attack Maternal Grandfather    Rectal cancer Paternal Grandmother    Heart attack Paternal Grandfather    Asthma Daughter    Allergies  Daughter    Asthma Son    Allergies Son     Social History   Tobacco Use   Smoking status: Never    Passive exposure: Past   Smokeless tobacco: Never  Vaping Use   Vaping status: Never Used  Substance Use Topics   Alcohol use: Yes    Comment: occasional   Drug use: No    ROS: As per history of present illness, otherwise negative  BP 128/84 (BP Location: Left Arm, Patient Position: Sitting, Cuff Size: Normal)   Pulse 84   Ht 5' 3 (1.6 m)   Wt 138 lb 6 oz (62.8 kg)   BMI 24.51 kg/m  Gen: awake, alert, NAD  HEENT: anicteric  CV: RRR, no mrg Pulm: CTA b/l Abd: soft, mild diffuse tenderness without rebound or guarding, no palpable masses or hepatosplenomegaly, nondistended today, +BS throughout Ext: no c/c/e Neuro: nonfocal   RELEVANT LABS AND IMAGING: CBC    Component Value Date/Time   WBC 6.7 09/15/2023 1537   WBC 5.0 10/21/2016 1156   RBC 4.55 09/15/2023 1537   RBC 4.20 10/21/2016 1156   HGB 15.4 09/15/2023 1537   HCT 45.2 09/15/2023 1537   PLT 292 09/15/2023 1537   MCV 99 (H) 09/15/2023 1537   MCH 33.8 (H) 09/15/2023 1537   MCH 34.0 (H) 10/21/2016 1156   MCHC 34.1 09/15/2023 1537   MCHC 33.6 10/21/2016 1156   RDW 11.4 (L) 09/15/2023 1537   LYMPHSABS 1.7 09/15/2023 1537   MONOABS 650 10/21/2016 1156   EOSABS 0.0 09/15/2023 1537   BASOSABS 0.0 09/15/2023 1537    CMP     Component Value Date/Time   NA 141 09/15/2023 1537   K 4.3 09/15/2023 1537   CL 101 09/15/2023 1537   CO2 26 09/15/2023 1537   GLUCOSE 98 09/15/2023 1537   GLUCOSE 88 07/24/2016 1127   BUN 10 09/15/2023 1537   CREATININE 0.66 09/15/2023 1537   CREATININE 0.83 07/24/2016 1127   CALCIUM  9.5 09/15/2023 1537   PROT 6.9 09/15/2023 1537   ALBUMIN 4.7 09/15/2023 1537   AST 20 09/15/2023 1537   ALT 25 09/15/2023 1537   ALKPHOS 80 09/15/2023 1537   BILITOT 0.4 09/15/2023 1537    Results   LABS Skin testing: Negative for 72 foods Stool sample for H. pylori: Negative Celiac  serology: Negative Alpha-gal: Negative Fasting blood panel: All results within normal limits  DIAGNOSTIC Colonoscopy: Diverticulosis in sigmoid colon (2022)      ASSESSMENT/PLAN: 63 year old female with a history of GERD, diverticulosis, chronic abdominal bloating and discomfort, possible IBS, hypertension and PSVT who is here to establish care.  Recent acute abdominal pain with diarrhea and bleeding/presumed Ischemic Colitis Recent episode of abdominal pain, bloody diarrhea, and mucus consistent with ischemic colitis. Currently on Flagyl .  We discussed how antibiotics are often not helpful for ischemic colitis.  I recommend the following: -Discontinue Flagyl . -Increase Bentyl  to 20mg  three times a day as needed for pain. -Order fecal calprotectin in two weeks to assess for inflammation. -Order MRI plus MRA of abdomen and pelvis to evaluate mesenteric vessels given recent intestinal ischemia (ordered over CTA because of her exacerbation of paroxysmal SVT after her last CT with contrast)  2. Chronic increased bowel frequency/abdominal bloating and cramping Chronic in nature over 2 years but quite distressing for her.  She takes frequent Gas-X during these attacks.  This could be consistent with IBS but also SIBO.  I cannot exclude that she has an internal hernia or adhesive disease from prior umbilical hernia repair causing altered bowel function/trapped gas.  Celiac disease excluded, alpha gal excluded, full lactose and gluten-free trial not helpful.  Interestingly FODMAP diet did not help either. -Continue current regimen low dose Miralax, and psyllium. -Continue Gas-X as needed for bloating and gas. -Hold probiotic for now as it has not proven beneficial symptomatically -Order testing for small intestinal bacterial overgrowth (SIBO) after Flagyl  washout period x 14 days. -Consider pill camera to evaluate for inflammation if fecal calprotectin positive.  3. GERD Managed with  Dexilant . -Continue Dexilant  60 mg daily.  4.  Colon cancer screening --normal colonoscopy with Dr. Rollin as above. -- Surveillance exam would be recommended May 2032  Follow-up in May with interim communication as test results become available.      65 minutes total spent today including patient facing time, coordination of care, reviewing medical history/procedures/pertinent radiology studies, and documentation of the encounter.   Rr:Unfaopw, [place], Md [address] [place],  [place] [address]

## 2023-10-13 NOTE — Patient Instructions (Addendum)
 Your provider has requested that you go to the basement level for lab work before leaving today. Press B on the elevator. The lab is located at the first door on the left as you exit the elevator.  Increase your bentyl  to 20 mg three times a day before meals. A new prescription has been sent to your pharmacy.   Stop taking Flagyl . Please be off of the Flagyl  x 2 weeks before submitting the breath test.  You have been given a testing kit to check for small intestine bacterial overgrowth (SIBO) which is completed by a company named [facility]. Make sure to return your test in the mail using the return mailing label given to you along with the kit. The test order, your demographic and insurance information have all already been sent to the company. [facility] will collect an upfront charge of $99.74 for commercial insurance plans and $209.74 if you are paying cash. Make sure to discuss with [facility] PRIOR to having the test to see if they have gotten information from your insurance company as to how much your testing will cost out of pocket, if any. Please contact [facility] at phone number [phone] to get instructions regarding how to perform the test as our office is unable to give specific testing instructions.   Remain on Dexilant , Miralax and psyllium and current doses.  You have been scheduled for an MRI/MRA at [facility]. They will contact you directly with an appointment.  _______________________________________________________  If your blood pressure at your visit was 140/90 or greater, please contact your primary care physician to follow up on this.  _______________________________________________________  If you are age 59 or older, your body mass index should be between 23-30. Your Body mass index is 24.51 kg/m. If this is out of the aforementioned range listed, please consider follow up with your Primary Care Provider.  If you are age 51 or  younger, your body mass index should be between 19-25. Your Body mass index is 24.51 kg/m. If this is out of the aformentioned range listed, please consider follow up with your Primary Care Provider.   ________________________________________________________  The [facility] providers would like to encourage you to use MYCHART to communicate with providers for non-urgent requests or questions.  Due to long hold times on the telephone, sending your provider a message by [facility] may be a faster and more efficient way to get a response.  Please allow 48 business hours for a response.  Please remember that this is for non-urgent requests.  _______________________________________________________

## 2023-10-14 ENCOUNTER — Other Ambulatory Visit: Payer: BC Managed Care – PPO

## 2023-10-14 ENCOUNTER — Telehealth: Payer: Self-pay | Admitting: Internal Medicine

## 2023-10-14 DIAGNOSIS — R1084 Generalized abdominal pain: Secondary | ICD-10-CM

## 2023-10-14 DIAGNOSIS — K559 Vascular disorder of intestine, unspecified: Secondary | ICD-10-CM

## 2023-10-14 DIAGNOSIS — R140 Abdominal distension (gaseous): Secondary | ICD-10-CM

## 2023-10-14 NOTE — Addendum Note (Signed)
Addended by: Illene Bolus on: 10/14/2023 12:42 PM   Modules accepted: Orders

## 2023-10-14 NOTE — Telephone Encounter (Signed)
 Inbound call from patient requesting for imaging orders to be sent to [facility]. States Victoria [name] has availability for this Saturday for imaging appointment. Requesting to have orders sent as soon as possible to be able to make appointment. Please advise, thank you.

## 2023-10-14 NOTE — Addendum Note (Signed)
 Addended by: MADAN, Lamari Beckles L on: 10/14/2023 02:52 PM   Modules accepted: Orders

## 2023-10-14 NOTE — Telephone Encounter (Signed)
 Peggy states the patient contacted her to schedule the MRI/MRA. Winton states she questioned whether these tests needed to be scheduled together or separate. Peggy then called Lonell at the [facility] radiology department.  Lonell and Winton both called me to ask more questions such as the reason for the MRI. Lonell states per her protocol the reasons for the MRI did not seem within the standard protocol of reasons. After consulting with Dr. Albertus, the test was switched to MR entero abd/pelvis w/w/o contrast and to keep the MRA.    Patient states she prefers her tests be scheduled at [facility] and not [facility]. Patient reports they wanted her to go in the ED entrance to check in for her tests. Patient reports she did not want to do that. Called [facility] and scheduled MR enterography on 11/03/23 at 12:40pm, arriving 10:40am. Patient cannot have anything to eat or drink 6 hours prior to test.   Patient is still scheduled for her MR angiogram for 10/29/23 at 2:20pm, arriving 30 minutes prior with nothing to eat or drink 4 hours prior to the test.  Patient informed of all test dates and times. Patient verbalized understanding.

## 2023-10-14 NOTE — Telephone Encounter (Signed)
 Returned patient's call and she states she is on the phone currently with [facility] to cancel her appt with them. Patient states she wants her MRI/MRA rescheduled to [facility] since they have sooner availability. Informed patient she can still use the order that is in the computer to reschedule at [facility] hospital. Provided the phone for central scheduling at [facility] to patient. Patient verbalized understanding and is contacting them to reschedule.

## 2023-10-16 ENCOUNTER — Encounter: Payer: Self-pay | Admitting: Internal Medicine

## 2023-10-16 ENCOUNTER — Other Ambulatory Visit: Payer: Self-pay

## 2023-10-16 DIAGNOSIS — K559 Vascular disorder of intestine, unspecified: Secondary | ICD-10-CM

## 2023-10-16 LAB — CALPROTECTIN, FECAL: Calprotectin, Fecal: 85 ug/g (ref 0–120)

## 2023-10-17 ENCOUNTER — Ambulatory Visit (HOSPITAL_COMMUNITY): Payer: BC Managed Care – PPO

## 2023-10-19 NOTE — Telephone Encounter (Signed)
 I think we need to wait and get the imaging done, not sure if MRI/MRA could be done sooner? That would be my only current recommendation

## 2023-10-20 ENCOUNTER — Other Ambulatory Visit (HOSPITAL_COMMUNITY): Payer: BC Managed Care – PPO

## 2023-10-23 ENCOUNTER — Other Ambulatory Visit: Payer: BC Managed Care – PPO

## 2023-10-23 ENCOUNTER — Other Ambulatory Visit: Payer: Self-pay

## 2023-10-27 ENCOUNTER — Ambulatory Visit
Admission: RE | Admit: 2023-10-27 | Discharge: 2023-10-27 | Disposition: A | Discharge status: Home / Self Care | Payer: BC Managed Care – PPO | Source: Ambulatory Visit | Attending: Internal Medicine | Admitting: Internal Medicine

## 2023-10-27 DIAGNOSIS — R140 Abdominal distension (gaseous): Secondary | ICD-10-CM | POA: Diagnosis not present

## 2023-10-27 DIAGNOSIS — K559 Vascular disorder of intestine, unspecified: Secondary | ICD-10-CM

## 2023-10-27 DIAGNOSIS — R1084 Generalized abdominal pain: Secondary | ICD-10-CM

## 2023-10-27 MED ORDER — GADOPICLENOL 0.5 MMOL/ML IV SOLN
7.5000 mL | Freq: Once | INTRAVENOUS | Status: AC | PRN
  Administered 2023-10-27: 7 mL via INTRAVENOUS

## 2023-10-29 ENCOUNTER — Other Ambulatory Visit: Payer: BC Managed Care – PPO

## 2023-10-29 ENCOUNTER — Inpatient Hospital Stay: Admission: RE | Admit: 2023-10-29 | Payer: BC Managed Care – PPO | Source: Ambulatory Visit

## 2023-10-30 ENCOUNTER — Encounter: Payer: Self-pay | Admitting: Internal Medicine

## 2023-10-30 ENCOUNTER — Other Ambulatory Visit: Payer: BC Managed Care – PPO

## 2023-10-30 DIAGNOSIS — K559 Vascular disorder of intestine, unspecified: Secondary | ICD-10-CM | POA: Diagnosis not present

## 2023-11-02 ENCOUNTER — Telehealth: Payer: Self-pay | Admitting: Internal Medicine

## 2023-11-02 DIAGNOSIS — R140 Abdominal distension (gaseous): Secondary | ICD-10-CM | POA: Diagnosis not present

## 2023-11-02 NOTE — Telephone Encounter (Signed)
 Requesting f/u call to discuss symptoms from a cibo test she took today. Please advise.

## 2023-11-02 NOTE — Telephone Encounter (Signed)
 Patient called to follow up on MyChart message she stated she is not feeling well and has two MRI appts for tomorrow.

## 2023-11-03 ENCOUNTER — Other Ambulatory Visit: Payer: BC Managed Care – PPO

## 2023-11-03 ENCOUNTER — Ambulatory Visit
Admission: RE | Admit: 2023-11-03 | Discharge: 2023-11-03 | Disposition: A | Discharge status: Home / Self Care | Payer: BC Managed Care – PPO | Source: Ambulatory Visit | Attending: Internal Medicine | Admitting: Internal Medicine

## 2023-11-03 DIAGNOSIS — K5730 Diverticulosis of large intestine without perforation or abscess without bleeding: Secondary | ICD-10-CM | POA: Diagnosis not present

## 2023-11-03 DIAGNOSIS — N83292 Other ovarian cyst, left side: Secondary | ICD-10-CM | POA: Diagnosis not present

## 2023-11-03 DIAGNOSIS — R1084 Generalized abdominal pain: Secondary | ICD-10-CM

## 2023-11-03 DIAGNOSIS — K219 Gastro-esophageal reflux disease without esophagitis: Secondary | ICD-10-CM

## 2023-11-03 DIAGNOSIS — R140 Abdominal distension (gaseous): Secondary | ICD-10-CM

## 2023-11-03 DIAGNOSIS — K559 Vascular disorder of intestine, unspecified: Secondary | ICD-10-CM

## 2023-11-03 LAB — CALPROTECTIN, FECAL: Calprotectin, Fecal: 47 ug/g (ref 0–120)

## 2023-11-03 MED ORDER — GADOPICLENOL 0.5 MMOL/ML IV SOLN
6.0000 mL | Freq: Once | INTRAVENOUS | Status: AC | PRN
  Administered 2023-11-03: 6 mL via INTRAVENOUS

## 2023-11-03 NOTE — Telephone Encounter (Signed)
 See my chart message

## 2023-11-06 ENCOUNTER — Telehealth: Payer: Self-pay

## 2023-11-06 ENCOUNTER — Other Ambulatory Visit: Payer: BC Managed Care – PPO

## 2023-11-06 NOTE — Telephone Encounter (Signed)
 Received fax from [facility] lab with SIBO (small intestine bacteria overgrowth) breath test results. Analysis of the data suggests bacterial growth is suspected. Results placed in your office for review. Please advise Dr. Rhea Belton.

## 2023-11-06 NOTE — Telephone Encounter (Signed)
 Inbound call from patient wanting to also advise that she would like to proceed with scheduling colonoscopy as soon as possible. Advise patient of next available. Requesting to speak to a nurse about possibility of sooner appointment along with notes below. Please advise, thank you.

## 2023-11-09 ENCOUNTER — Other Ambulatory Visit: Payer: Self-pay

## 2023-11-09 DIAGNOSIS — R1084 Generalized abdominal pain: Secondary | ICD-10-CM

## 2023-11-09 DIAGNOSIS — R933 Abnormal findings on diagnostic imaging of other parts of digestive tract: Secondary | ICD-10-CM

## 2023-11-09 MED ORDER — CLENPIQ 10-3.5-12 MG-GM -GM/160ML PO SOLN: 1.0000 | ORAL | 0 refills | Status: DC

## 2023-11-09 NOTE — Telephone Encounter (Signed)
 Informed patient of her positive SIBO results and explained Dr. Lauro Franklin recommendations of starting xifaxan 550 mg 3 times daily x 14 days. Informed patient I have samples I can leave at the front desk for her to pick up. Patient states she will come and pick them up tomorrow. Patient scheduled her colonoscopy on 12/15/23 at 3pm. Informed patient I will put her colonoscopy instructions in with her samples and to call our office if she has any questions. Patient verbalized understanding. Patient requested her prep be Clenpiq. Prep sent to patient's pharmacy.

## 2023-11-09 NOTE — Telephone Encounter (Signed)
 Patient's SIBO test is positive  Please treat with rifaximin 550 mg 3 times daily x 14 days Okay for samples if necessary

## 2023-11-09 NOTE — Telephone Encounter (Signed)
 FYI We are treating for SIBO and colonoscopy should be scheduled JMP

## 2023-11-10 NOTE — Telephone Encounter (Signed)
 Patient called stated she would like the prep sample offered. Asking if you have any further info regarding SIBO that can be placed at the front desk for her. Please advise.

## 2023-11-10 NOTE — Telephone Encounter (Signed)
 Returned patient's call and informed her that I did find a Clenpiq sample that I can leave at the front desk along with the xifaxan samples. Patient verbalized understanding. Also, patient asked if we had information on SIBO that we can provide for her. I informed patient we do not have any pamphlets on SIBO but she can google some more information. Patient states she has been doing that and will continue to do that for more information.

## 2023-11-19 ENCOUNTER — Telehealth: Payer: Self-pay | Admitting: Allergy and Immunology

## 2023-11-19 NOTE — Telephone Encounter (Signed)
 Patient called and stated that if she could have a blood work for food intolerance but for specially at Lactose intolerance. She asked if she can come in for blood work before the appointment.  She stated she thought he order the bloodwork for her.   Best Contact: [phone]

## 2023-11-19 NOTE — Telephone Encounter (Signed)
 Per Provider:  Please inform Victoria [name] that there is no blood test for lactose intolerance.  Which foods which he would like to be tested for on the blood test?   Patient stated her GI had her take a SIBO (Small Intestinal Bacteria Test) Breath Test - tested Positive; Rx Xifaxan (Rifaximin) 550 mg TID x 14 days.  Patient stated her colonoscopy is scheduled for 12/15/23.  Patient wanted to know since no blood test can be done if she needs to keep her appt on 11/24/23.  Patient advised updated message would be forwarded to provider to see whether or not appt needs to kept.  Patient verbalized understanding to all, no further questions.

## 2023-11-23 DIAGNOSIS — N83201 Unspecified ovarian cyst, right side: Secondary | ICD-10-CM | POA: Diagnosis not present

## 2023-11-23 DIAGNOSIS — F419 Anxiety disorder, unspecified: Secondary | ICD-10-CM | POA: Diagnosis not present

## 2023-11-23 DIAGNOSIS — N83202 Unspecified ovarian cyst, left side: Secondary | ICD-10-CM | POA: Diagnosis not present

## 2023-11-24 ENCOUNTER — Ambulatory Visit: Payer: BC Managed Care – PPO | Admitting: Allergy and Immunology

## 2023-11-25 ENCOUNTER — Telehealth: Payer: Self-pay

## 2023-11-25 NOTE — Telephone Encounter (Signed)
 Spoke with Victoria [name] regarding her referral to GYN oncology. She has an appointment scheduled with Dr. Pricilla Holm on 12/31/23 at 11:15. Patient agrees to date and time. She has been provided with office address and location. She is also aware of our mask and visitor policy. Patient verbalized understanding and will call with any questions.

## 2023-11-26 NOTE — Telephone Encounter (Signed)
 Per Provider:  No return visit needed. Are there specific foods she would like to check antibodies against to look at possible allergy?   Called patient - DOB verified - stated she did not have any specific foods she wanted to check antibodies for possible allergy.  Forwarding updated message to provider.

## 2023-11-26 NOTE — Telephone Encounter (Addendum)
 Patient called stating she is wanting food sensitivity testing. Patient is also wanting to know what foods can we test for food sensitivity. Patient states she does not want an allergy test. Patient would like a call back at [phone].

## 2023-11-26 NOTE — Telephone Encounter (Signed)
 Per Provider:  We can perform IgE testing directed against specific foods but will need to know which food she would like to be tested against.  IgE testing is the only reliable and validated test looking at food hypersensitivity.   Called patient - DOB/DPR verified - LMOVM regarding provider notation above. Patient advised she can send myChart message as well.  If/When patient call back - please advise of above provider notation.

## 2023-11-27 ENCOUNTER — Ambulatory Visit: Payer: BC Managed Care – PPO | Admitting: Family Medicine

## 2023-11-27 ENCOUNTER — Encounter: Payer: Self-pay | Admitting: Psychiatry

## 2023-11-30 ENCOUNTER — Inpatient Hospital Stay (HOSPITAL_BASED_OUTPATIENT_CLINIC_OR_DEPARTMENT_OTHER): Admitting: Gynecologic Oncology

## 2023-11-30 ENCOUNTER — Telehealth: Payer: Self-pay | Admitting: Oncology

## 2023-11-30 ENCOUNTER — Inpatient Hospital Stay

## 2023-11-30 ENCOUNTER — Encounter: Payer: Self-pay | Admitting: Psychiatry

## 2023-11-30 ENCOUNTER — Inpatient Hospital Stay: Attending: Psychiatry | Admitting: Psychiatry

## 2023-11-30 VITALS — BP 140/84 | HR 85 | Temp 99.2°F | Resp 19 | Ht 63.0 in | Wt 136.0 lb

## 2023-11-30 DIAGNOSIS — Z800 Family history of malignant neoplasm of digestive organs: Secondary | ICD-10-CM | POA: Insufficient documentation

## 2023-11-30 DIAGNOSIS — R1900 Intra-abdominal and pelvic swelling, mass and lump, unspecified site: Secondary | ICD-10-CM | POA: Diagnosis not present

## 2023-11-30 DIAGNOSIS — R102 Pelvic and perineal pain: Secondary | ICD-10-CM | POA: Diagnosis not present

## 2023-11-30 DIAGNOSIS — Z79899 Other long term (current) drug therapy: Secondary | ICD-10-CM | POA: Diagnosis not present

## 2023-11-30 DIAGNOSIS — K219 Gastro-esophageal reflux disease without esophagitis: Secondary | ICD-10-CM | POA: Diagnosis not present

## 2023-11-30 DIAGNOSIS — M419 Scoliosis, unspecified: Secondary | ICD-10-CM | POA: Insufficient documentation

## 2023-11-30 DIAGNOSIS — J45909 Unspecified asthma, uncomplicated: Secondary | ICD-10-CM | POA: Insufficient documentation

## 2023-11-30 DIAGNOSIS — I471 Supraventricular tachycardia, unspecified: Secondary | ICD-10-CM | POA: Diagnosis not present

## 2023-11-30 DIAGNOSIS — R634 Abnormal weight loss: Secondary | ICD-10-CM | POA: Insufficient documentation

## 2023-11-30 DIAGNOSIS — K589 Irritable bowel syndrome without diarrhea: Secondary | ICD-10-CM | POA: Diagnosis not present

## 2023-11-30 DIAGNOSIS — I10 Essential (primary) hypertension: Secondary | ICD-10-CM | POA: Insufficient documentation

## 2023-11-30 DIAGNOSIS — R6881 Early satiety: Secondary | ICD-10-CM | POA: Diagnosis not present

## 2023-11-30 LAB — CEA (ACCESS): CEA (CHCC): <1 ng/mL (ref 0.00–5.00)

## 2023-11-30 NOTE — Telephone Encounter (Signed)
 Pt had PV today and has colon scheduled for April 8 at 3pm. Let her know recommendations via mychart. Also let her know we will add her to the cancellation list.

## 2023-11-30 NOTE — Patient Instructions (Addendum)
 Preparing for your Surgery  Plan for surgery on January 05, 2024 with Dr. [name] at [facility]. You will be scheduled for robotic assisted laparoscopic bilateral salpingo-oophorectomy (removal of both ovaries and fallopian tubes), possible robotic assisted total hysterectomy (removal of the uterus and cervix), possible staging if a precancer or cancer is seen.   Pre-operative Testing -You will receive a phone call from presurgical testing at [facility] to arrange for a pre-operative appointment and lab work.  -Bring your insurance card, copy of an advanced directive if applicable, medication list  -At that visit, you will be asked to sign a consent for a possible blood transfusion in case a transfusion becomes necessary during surgery.  The need for a blood transfusion is rare but having consent is a necessary part of your care.     -You should not be taking blood thinners or aspirin at least ten days prior to surgery unless instructed by your surgeon.  -Do not take supplements such as fish oil (omega 3), red yeast rice, turmeric before your surgery. STOP TAKING AT LEAST 10 DAYS BEFORE SURGERY. You want to avoid medications with aspirin in them including headache powders such as BC or Goody's), Excedrin migraine.  Day Before Surgery at Home -You will be asked to take in a light diet the day before surgery. You will be advised you can have clear liquids up until 3 hours before your surgery.    Eat a light diet the day before surgery.  Examples including soups, broths, toast, yogurt, mashed potatoes.  AVOID GAS PRODUCING FOODS AND BEVERAGES. Things to avoid include carbonated beverages (fizzy beverages, sodas), raw fruits and raw vegetables (uncooked), or beans.   If your bowels are filled with gas, your surgeon will have difficulty visualizing your pelvic organs which increases your surgical risks.  Your role in recovery Your role is to become active as soon as  directed by your doctor, while still giving yourself time to heal.  Rest when you feel tired. You will be asked to do the following in order to speed your recovery:  - Cough and breathe deeply. This helps to clear and expand your lungs and can prevent pneumonia after surgery.  - STAY ACTIVE WHEN YOU GET HOME. Do mild physical activity. Walking or moving your legs help your circulation and body functions return to normal. Do not try to get up or walk alone the first time after surgery.   -If you develop swelling on one leg or the other, pain in the back of your leg, redness/warmth in one of your legs, please call the office or go to the Emergency Room to have a doppler to rule out a blood clot. For shortness of breath, chest pain-seek care in the Emergency Room as soon as possible. - Actively manage your pain. Managing your pain lets you move in comfort. We will ask you to rate your pain on a scale of zero to 10. It is your responsibility to tell your doctor or nurse where and how much you hurt so your pain can be treated.  Special Considerations -If you are diabetic, you may be placed on insulin after surgery to have closer control over your blood sugars to promote healing and recovery.  This does not mean that you will be discharged on insulin.  If applicable, your oral antidiabetics will be resumed when you are tolerating a solid diet.  -Your final pathology results from surgery should be available around one week after  surgery and the results will be relayed to you when available.  -Dr. Antionette Char is the surgeon that assists your GYN Oncologist with surgery.  If you end up staying the night, the next day after your surgery you will either see Dr. Pricilla Holm, Dr. Alvester Morin, or Dr. Antionette Char.  -FMLA forms can be faxed to [phone] and please allow 5-7 business days for completion.  Pain Management After Surgery -You will be prescribed your pain medication and bowel regimen medications  before surgery so that you can have these available when you are discharged from the hospital. The pain medication is for use ONLY AFTER surgery and a new prescription will not be given.   -Make sure that you have Tylenol and Ibuprofen IF YOU ARE ABLE TO TAKE THESE MEDICATIONS at home to use on a regular basis after surgery for pain control. We recommend alternating the medications every hour to six hours since they work differently and are processed in the body differently for pain relief.  -Review the attached handout on narcotic use and their risks and side effects.   Bowel Regimen -You can continue with your daily miralax or we can prescribe Sennakot-S to take nightly to prevent constipation especially if you are taking the narcotic pain medication intermittently.  It is important to prevent constipation and drink adequate amounts of liquids.   Risks of Surgery Risks of surgery are low but include bleeding, infection, damage to surrounding structures, re-operation, blood clots, and very rarely death.   Blood Transfusion Information (For the consent to be signed before surgery)  We will be checking your blood type before surgery so in case of emergencies, we will know what type of blood you would need.                                            WHAT IS A BLOOD TRANSFUSION?  A transfusion is the replacement of blood or some of its parts. Blood is made up of multiple cells which provide different functions. Red blood cells carry oxygen and are used for blood loss replacement. White blood cells fight against infection. Platelets control bleeding. Plasma helps clot blood. Other blood products are available for specialized needs, such as hemophilia or other clotting disorders. BEFORE THE TRANSFUSION  Who gives blood for transfusions?  You may be able to donate blood to be used at a later date on yourself (autologous donation). Relatives can be asked to donate blood. This is generally not  any safer than if you have received blood from a stranger. The same precautions are taken to ensure safety when a relative's blood is donated. Healthy volunteers who are fully evaluated to make sure their blood is safe. This is blood bank blood. Transfusion therapy is the safest it has ever been in the practice of medicine. Before blood is taken from a donor, a complete history is taken to make sure that person has no history of diseases nor engages in risky social behavior (examples are intravenous drug use or sexual activity with multiple partners). The donor's travel history is screened to minimize risk of transmitting infections, such as malaria. The donated blood is tested for signs of infectious diseases, such as HIV and hepatitis. The blood is then tested to be sure it is compatible with you in order to minimize the chance of a transfusion reaction. If you or a relative  donates blood, this is often done in anticipation of surgery and is not appropriate for emergency situations. It takes many days to process the donated blood. RISKS AND COMPLICATIONS Although transfusion therapy is very safe and saves many lives, the main dangers of transfusion include:  Getting an infectious disease. Developing a transfusion reaction. This is an allergic reaction to something in the blood you were given. Every precaution is taken to prevent this. The decision to have a blood transfusion has been considered carefully by your caregiver before blood is given. Blood is not given unless the benefits outweigh the risks.  AFTER SURGERY INSTRUCTIONS  Return to work: 4-6 weeks if applicable  Activity: 1. Be up and out of the bed during the day.  Take a nap if needed.  You may walk up steps but be careful and use the hand rail.  Stair climbing will tire you more than you think, you may need to stop part way and rest.   2. No lifting or straining for 6 weeks over 10 pounds. No pushing, pulling, straining for 6  weeks.  3. No driving for 2-44 days when the following criteria have been met: Do not drive if you are taking narcotic pain medicine and make sure that your reaction time has returned.   4. You can shower as soon as the next day after surgery. Shower daily.  Use your regular soap and water (not directly on the incision) and pat your incision(s) dry afterwards; don't rub.  No tub baths or submerging your body in water until cleared by your surgeon. If you have the soap that was given to you by pre-surgical testing that was used before surgery, you do not need to use it afterwards because this can irritate your incisions.   5. No sexual activity and nothing in the vagina for 6 weeks, 12 weeks if you have a hysterectomy (removal of the uterus and cervix).  6. You may experience a small amount of clear drainage from your incisions, which is normal.  If the drainage persists, increases, or changes color please call the office.  7. Do not use creams, lotions, or ointments such as neosporin on your incisions after surgery until advised by your surgeon because they can cause removal of the dermabond glue on your incisions.    8. You may experience vaginal spotting after surgery or when the stitches at the top of the vagina begin to dissolve (if you have a hysterectomy).  The spotting is normal but if you experience heavy bleeding, call our office.  9. Take Tylenol or ibuprofen first for pain if you are able to take these medications and only use narcotic pain medication for severe pain not relieved by the Tylenol or Ibuprofen.  Monitor your Tylenol intake to a max of 4,000 mg in a 24 hour period. You can alternate these medications after surgery.  Diet: 1. Low sodium Heart Healthy Diet is recommended but you are cleared to resume your normal (before surgery) diet after your procedure.  2. It is safe to use a laxative, such as Miralax or Colace, if you have difficulty moving your bowels before surgery. You  have been prescribed Sennakot-S to take at bedtime every evening after surgery to keep bowel movements regular and to prevent constipation.    Wound Care: 1. Keep clean and dry.  Shower daily.  Reasons to call the Doctor: Fever - Oral temperature greater than 100.4 degrees Fahrenheit Foul-smelling vaginal discharge Difficulty urinating Nausea and vomiting Increased  pain at the site of the incision that is unrelieved with pain medicine. Difficulty breathing with or without chest pain New calf pain especially if only on one side Sudden, continuing increased vaginal bleeding with or without clots.   Contacts: For questions or concerns you should contact:  Dr. [name] at [phone]  Warner Mccreedy, NP at [phone]  After Hours: call [phone] and have the GYN Oncologist paged/contacted (after 5 pm or on the weekends). You will speak with an after hours RN and let he or she know you have had surgery.  Messages sent via mychart are for non-urgent matters and are not responded to after hours so for urgent needs, please call the after hours number.

## 2023-11-30 NOTE — Telephone Encounter (Signed)
 PT finished antibiotics and still feels bad. She has a lot of bloating, gas buildup and now nausea. Please advise.

## 2023-11-30 NOTE — Progress Notes (Signed)
 GYNECOLOGIC ONCOLOGY NEW PATIENT CONSULTATION  Date of Service: 11/30/2023 Referring Provider: [name], MD   ASSESSMENT AND PLAN: Victoria [name] is a 63 y.o. woman with a 7.3cm simple left ovarian cyst.  We reviewed that the exact etiology of the pelvic mass is unclear, but could include a benign, borderline, or malignant process. Based on the available information, I suspect this is most likely a benign process. However given her symptoms and to make a definitive diagnosis, reasonable to proceed with surgical excision.     Because the mass is relatively small, we feel that a minimally invasive approach is feasible, using robotic assistance.   In the event of malignancy or borderline tumor on frozen section, we will perform indicated staging procedures. We discussed that these procedures may include hysterectomy, omentectomy pelvic and/or para-aortic lymphadenectomy, peritoneal biopsies. We would also remove any tissue concerning for metastatic disease which could require additional procedures including bowel surgery.  Patient was consented for: Robotic assisted bilateral salpingo-oophorectomy, possible hysterectomy, possible staging on 01/05/24.  The risks of surgery were discussed in detail and she understands these to including but not limited to bleeding requiring a blood transfusion, infection, injury to adjacent organs (including but not limited to the bowels, bladder, ureters, nerves, blood vessels), thromboembolic events, wound separation, hernia, vaginal cuff separation, possible risk of lymphedema and lymphocyst if lymphadenectomy performed, unforseen complication, and possible need for re-exploration.  If the patient experiences any of these events, she understands that her hospitalization or recovery may be prolonged and that she may need to take additional medications for a prolonged period. The patient will receive DVT and antibiotic prophylaxis as indicated. She voiced a clear  understanding. She had the opportunity to ask questions and informed consent was obtained today. She wishes to proceed.  She will proceed to the lab today for CA125, CEA, CA 19-9. She does not require preoperative clearance. Her METs are >4.  All preoperative instructions were reviewed. Postoperative expectations were also reviewed. Written handouts were provided to the patient.   A copy of this note was sent to the patient's referring provider.  [name], MD Gynecologic Oncology   Medical Decision Making I personally spent  TOTAL 60 minutes face-to-face and non-face-to-face in the care of this patient, which includes all pre, intra, and post visit time on the date of service.   ------------  CC: Cystic adnexal mass  HISTORY OF PRESENT ILLNESS:  Victoria [name] is a 63 y.o. woman who is seen in consultation at the request of Victoria Hedge, MD for evaluation of cystic adnexal mass.  Patient was seen by her OB/GYN on 11/23/2023 for follow-up of adnexal mass seen on MRI and workup of GI symptoms.  Patient has had about a year of bloating, GI upset for which she is undergoing a workup with gastroenterology.  She notes left lower quadrant pressure and discomfort for few weeks.  MRI on 11/03/2023 noted a simple left ovarian cyst measuring 6.3 x 5.8 cm and a small right ovarian cyst 2.2 x 1.4 cm which were deemed to be most likely benign per MRI read.  She subsequently underwent pelvic ultrasound on 11/23/2023 with her OB/GYN which noted a 1.6 x 1.4 and 1.4 x 1.2 cm simple right ovarian cysts and a 7.3 x 5.1 x 6.1 cm simple left ovarian cyst.  Today patient presents with her husband.  Patient reports that she has had GI symptoms for the past few years for which she follows with Dr. Rhea Belton.  She  reports recently being diagnosed with SIBO and completing antibiotic therapy with rifaximin x 2 weeks.  More recently she notes left lower quadrant pain for the past week that is pulling, sharp and  stabbing in nature and worse with walking.  She has been experiencing bloating for the past 1 to 2 years but noticed some increased bloating over the weekend alongside the pain.  She also has had a 12 pound weight loss in the past 8 months but she attributes this to diet changes.  She also has had concurrent early satiety.  She has bowel movements 4-8 times a day.  No change in bladder habits.   PAST MEDICAL HISTORY: Past Medical History:  Diagnosis Date   Asthma    Diverticulosis    GERD (gastroesophageal reflux disease)    Headache    Hypertension    IBS (irritable bowel syndrome)    Palpitations    PSVT (paroxysmal supraventricular tachycardia) (HCC)    Scoliosis    Vertigo     PAST SURGICAL HISTORY: Past Surgical History:  Procedure Laterality Date   abdominal hernia     ? umbilical   COLONOSCOPY  01/29/2021   SHOULDER ARTHROSCOPY Right    TONSILLECTOMY     VEIN SURGERY Bilateral 1994    OB/GYN HISTORY: OB History  Gravida Para Term Preterm AB Living  2 2 2   2   SAB IAB Ectopic Multiple Live Births      2    # Outcome Date GA Lbr Len/2nd Weight Sex Type Anes PTL Lv  2 Term      Vag-Spont   LIV  1 Term      Vag-Spont   LIV      Age at menarche: 60 Age at menopause: around mid 64s Hx of HRT: Hormone patch, pills for several years in her 4s Hx of STI: no Last pap: 09/18/23 NILM History of abnormal pap smears: no  SCREENING STUDIES:  Last mammogram: 09/24/23 Last colonoscopy: 2022  MEDICATIONS:  Current Outpatient Medications:    ALPRAZolam (XANAX) 0.5 MG tablet, Take 0.5 mg by mouth at bedtime as needed., Disp: , Rfl:    Calcium Carb-Cholecalciferol (CALCIUM 1000 + D PO), Take by mouth 2 (two) times daily., Disp: , Rfl:    Cranberry-Vitamin C-Probiotic (AZO CRANBERRY) 250-30 MG TABS, 2 tablets daily as needed., Disp: , Rfl:    cyclobenzaprine (FLEXERIL) 10 MG tablet, Take 1 tablet (10 mg total) by mouth 3 (three) times daily as needed for muscle spasms.,  Disp: 270 tablet, Rfl: 1   DEXILANT 60 MG capsule, TAKE 1 CAPSULE BY MOUTH EVERY DAY, Disp: 90 capsule, Rfl: 0   dicyclomine (BENTYL) 20 MG tablet, Take 1 tablet (20 mg total) by mouth 3 (three) times daily before meals., Disp: 90 tablet, Rfl: 11   diltiazem (CARDIZEM) 30 MG tablet, Take 1 tablet (30 mg total) by mouth 3 (three) times daily. (Patient taking differently: Take 30 mg by mouth 2 (two) times daily.), Disp: 270 tablet, Rfl: 3   fluticasone (FLONASE) 50 MCG/ACT nasal spray, Place 2 sprays into both nostrils daily., Disp: 16 g, Rfl: 3   Magnesium 200 MG TABS, Take 1 tablet by mouth daily., Disp: , Rfl:    metroNIDAZOLE (METROGEL) 1 % gel, APPLY EXTERNALLY TO FACE EVERY NIGHT AT qhs prn. Needs appt for more refills., Disp: 55 g, Rfl: 0   Multiple Vitamin (MULTIVITAMIN) tablet, Take 1 tablet by mouth daily., Disp: , Rfl:    polyethylene glycol (MIRALAX / GLYCOLAX)  packet, Take 17 g by mouth daily., Disp: , Rfl:    PREVIDENT 5000 SENSITIVE 1.1-5 % PSTE, , Disp: , Rfl: 5   psyllium (METAMUCIL) 58.6 % powder, Take 1 packet by mouth daily. , Disp: , Rfl:    RESTASIS 0.05 % ophthalmic emulsion, INSTILL 1 DROP IN EACH EYE Q 12 H, Disp: , Rfl: 3   Sod Picosulfate-Mag Ox-Cit Acd (CLENPIQ) 10-3.5-12 MG-GM -GM/160ML SOLN, Take 1 kit by mouth as directed., Disp: 320 mL, Rfl: 0  ALLERGIES: Allergies  Allergen Reactions   Sulfamethoxazole-Trimethoprim Anaphylaxis    Face, mouth, gums, swelling and on fire    FAMILY HISTORY: Family History  Problem Relation Age of Onset   Urticaria Mother    Allergic rhinitis Mother    Hypertension Mother    GER disease Mother    Hiatal hernia Mother    Gallbladder disease Mother    Heart attack Mother    Heart disease Mother    Allergic rhinitis Father    Urticaria Father    Heart attack Father    Stroke Father    Scoliosis Father    Heart disease Father    Cancer Maternal Grandmother        type unknown   Heart attack Maternal Grandfather     Rectal cancer Paternal Grandmother    Heart attack Paternal Grandfather    Asthma Daughter    Allergies Daughter    Asthma Son    Allergies Son    Breast cancer Neg Hx    Ovarian cancer Neg Hx    Uterine cancer Neg Hx     SOCIAL HISTORY: Social History   Socioeconomic History   Marital status: Married    Spouse name: Not on file   Number of children: 2   Years of education: Not on file   Highest education level: Not on file  Occupational History   Occupation: retired  Tobacco Use   Smoking status: Never    Passive exposure: Past   Smokeless tobacco: Never  Vaping Use   Vaping status: Never Used  Substance and Sexual Activity   Alcohol use: Yes    Comment: occasional   Drug use: No   Sexual activity: Yes    Partners: Male  Other Topics Concern   Not on file  Social History Narrative   Not on file   Social Drivers of Health   Financial Resource Strain: Not on file  Food Insecurity: No Food Insecurity (11/27/2023)   Hunger Vital Sign    Worried About Running Out of Food in the Last Year: Never true    Ran Out of Food in the Last Year: Never true  Transportation Needs: No Transportation Needs (11/27/2023)   PRAPARE - Administrator, Civil Service (Medical): No    Lack of Transportation (Non-Medical): No  Physical Activity: Not on file  Stress: Not on file  Social Connections: Not on file  Intimate Partner Violence: Not At Risk (11/27/2023)   Humiliation, Afraid, Rape, and Kick questionnaire    Fear of Current or Ex-Partner: No    Emotionally Abused: No    Physically Abused: No    Sexually Abused: No    REVIEW OF SYSTEMS: New patient intake form was reviewed.  Complete 10-system review is negative except for the following: Distention, pelvic pain, anxiety, abdominal pain, fatigue, bloating, nausea  PHYSICAL EXAM: BP (!) 145/76 (BP Location: Left Arm, Patient Position: Sitting) Comment: Notified RN  Pulse 85   Temp 99.2 F (  37.3 C) (Oral)   Resp  19   Ht 5\' 3"  (1.6 m)   Wt 136 lb (61.7 kg)   SpO2 100%   BMI 24.09 kg/m  Constitutional: No acute distress. Neuro/Psych: Alert, oriented.  Head and Neck: Normocephalic, atraumatic. Neck symmetric without masses. Sclera anicteric.  Respiratory: Normal work of breathing. Clear to auscultation bilaterally. Cardiovascular: Regular rate and rhythm, no murmurs, rubs, or gallops. Abdomen: Normoactive bowel sounds. Soft, non-distended, non-tender to palpation. No masses appreciated. Well healed periumbilical incision. Extremities: Grossly normal range of motion. Warm, well perfused. No edema bilaterally. Skin: No rashes or lesions. Lymphatic: No cervical, supraclavicular, or inguinal adenopathy. Genitourinary: External genitalia without lesions. Urethral meatus without lesions or prolapse. On speculum exam, vagina and cervix without lesions. Bimanual exam reveals small mobile uterus. Mobile left adnexal mass with TTP.  Exam chaperoned by Warner Mccreedy, NP   LABORATORY AND RADIOLOGIC DATA: Outside medical records were reviewed to synthesize the above history, along with the history and physical obtained during the visit.  Outside laboratory, and imaging reports were reviewed, with pertinent results below.  I personally reviewed the outside images.  WBC  Date Value Ref Range Status  09/15/2023 6.7 3.4 - 10.8 x10E3/uL Final  10/21/2016 5.0 3.8 - 10.8 K/uL Final   Hemoglobin  Date Value Ref Range Status  09/15/2023 15.4 11.1 - 15.9 g/dL Final   Hematocrit  Date Value Ref Range Status  09/15/2023 45.2 34.0 - 46.6 % Final   Platelets  Date Value Ref Range Status  09/15/2023 292 150 - 450 x10E3/uL Final   Magnesium  Date Value Ref Range Status  07/24/2016 2.1 1.5 - 2.5 mg/dL Final   Creat  Date Value Ref Range Status  07/24/2016 0.83 0.50 - 1.05 mg/dL Final    Comment:      For patients > or = 61 years of age: The upper reference limit for Creatinine is approximately 13% higher for  people identified as African-American.      Creatinine, Ser  Date Value Ref Range Status  09/15/2023 0.66 0.57 - 1.00 mg/dL Final   AST  Date Value Ref Range Status  09/15/2023 20 0 - 40 IU/L Final   ALT  Date Value Ref Range Status  09/15/2023 25 0 - 32 IU/L Final    MR ENTERO PELVIS W WO CONTRAST 11/03/2023  Narrative CLINICAL DATA:  Query small-bowel obstruction, colitis, enteritis  EXAM: MR ABDOMEN AND PELVIS WITHOUT AND WITH CONTRAST (MR ENTEROGRAPHY)  TECHNIQUE: Multiplanar, multisequence MRI of the abdomen and pelvis was performed both before and during bolus administration of intravenous contrast. Negative oral contrast VoLumen was given.  CONTRAST:  6 mL Vueway gadolinium contrast IV  COMPARISON:  MR abdomen, 10/27/2023  FINDINGS: COMBINED FINDINGS FOR BOTH MR ABDOMEN AND PELVIS  Examination is significantly limited by pervasive breath and peristaltic bowel motion artifact throughout, which degrades all sequences submitted for review, particularly multiphasic contrast enhanced sequences.  Lower chest: No acute abnormality.  Hepatobiliary: No solid liver abnormality is seen. Unchanged benign hemangioma in the left lobe requiring no further follow-up or characterization (series 5, image 6). No gallstones, gallbladder wall thickening, or biliary dilatation.  Pancreas: Unremarkable. No pancreatic ductal dilatation or surrounding inflammatory changes.  Spleen: Normal in size without significant abnormality.  Adrenals/Urinary Tract: Adrenal glands are unremarkable. Kidneys are normal, without renal calculi, solid lesion, or hydronephrosis. Bladder is unremarkable.  Stomach/Bowel: Stomach is within normal limits. Appendix appears normal. No small bowel distention or other abnormality within the exam  limitations. Colon is fluid-filled to nearly the rectum, and there is diffuse mucosal hyperenhancement of the colon, particularly conspicuous in the  sigmoid (series 13, image 75). Descending and sigmoid diverticulosis.  Vascular/Lymphatic: No significant vascular findings are present. No enlarged abdominal or pelvic lymph nodes.  Reproductive: No solid mass. Simple appearing left ovarian cyst measuring 6.3 x 5.8 cm (series 6, image 16). Small, thinly septated right ovarian cyst measuring 2.2 x 1.4 cm (series 6, image 12). Normal uterus.  Other: No abdominal wall hernia or abnormality. No ascites.  Musculoskeletal: No acute or significant osseous findings.  IMPRESSION: 1. Examination is significantly limited by pervasive breath and peristaltic bowel motion artifact throughout, which degrades all sequences submitted for review, particularly multiphasic contrast enhanced sequences. 2. Within this limitation, colon is fluid-filled to nearly the rectum, suggesting diarrheal illness although possibly exacerbated by administration of negative oral enteric contrast, and there is diffuse mucosal hyperenhancement of the colon, particularly conspicuous in the sigmoid. Findings suggest nonspecific infectious or inflammatory colitis. 3. Descending and sigmoid diverticulosis without associated wall thickening or specific findings of acute diverticulitis. 4. No obstruction, distention nor inflammatory findings of the small bowel. 5. Simple appearing left ovarian cyst measuring 6.3 x 5.8 cm. Small, thinly septated right ovarian cyst measuring 2.2 x 1.4 cm. These are almost certainly benign, however given size of the left ovarian cyst, recommend follow-up ultrasound in 6-12 months to ensure stability. Note: This recommendation does not apply to premenarchal patients and to those with increased risk (genetic, family history, elevated tumor markers or other high-risk factors) of ovarian cancer. Reference: JACR 2020 Feb; 17(2):248-254   Electronically Signed By: Jearld Lesch M.D. On: 11/03/2023 16:46  Pelvic ultrasound (11/23/23) Uterus  within normal limits Right ovary with simple appearing cyst measuring 1.6 x 1.4 cm and 1.4 x 1.2 cm Left ovary with simple appearing cyst measuring 7.3 x 5.1 x 6.1 cm No free fluid seen

## 2023-11-30 NOTE — Telephone Encounter (Signed)
 Called Victoria [name] and let her know that Dr. Alvester Morin would like her try tylenol and ibuprofen to see if that helps with her pain before trying narcotic pain medication (Tramadol).  Tallyn said she had talked with Dr. Alvester Morin about this and will try Tylenol - she can't take ibuprofen due to her GI issues.  Advised her to call back if the tylenol does not help.

## 2023-11-30 NOTE — Progress Notes (Unsigned)
 Patient here to see Dr. Alvester Morin for a consult and for a pre-operative appointment prior to her scheduled surgery on 01/05/2024. She is scheduled for a robotic assisted laparoscopic bilateral salpingo-oophorectomy, possible robotic assisted total hysterectomy, possible staging if a precancer or cancer is seen. The surgery was discussed in detail.  See after visit summary for additional details.      Discussed post-op pain management in detail including the aspects of the enhanced recovery pathway.  Advised her that a new prescription would be sent in and it is only to be used for after her upcoming surgery.  We discussed the use of tylenol post-op and to monitor for a maximum of 4,000 mg in a 24 hour period.  Also prescribed sennakot to be used after surgery and to hold if having loose stools.  Discussed bowel regimen in detail.     Discussed the use of SCDs and measures to take at home to prevent DVT including frequent mobility.  Reportable signs and symptoms of DVT discussed. Post-operative instructions discussed and expectations for after surgery. Incisional care discussed as well including reportable signs and symptoms including erythema, drainage, wound separation.     30 minutes spent with the patient.  Verbalizing understanding of material discussed. No needs or concerns voiced at the end of the visit.   Advised patient to call for any needs.  Advised that her post-operative medications had been prescribed and could be picked up at any time.    This appointment is included in the global surgical bundle as pre-operative teaching and has no charge.

## 2023-11-30 NOTE — Telephone Encounter (Signed)
 Pt states she has been off of the xifaxan for about a week. Reports this weekend she states she had extreme bloat, trapped gas and was very uncomfortable. States she has had nausea on and off. PT is very discouraged, wants to know if any other tests or meds may be of benefit. Please advise.

## 2023-11-30 NOTE — Telephone Encounter (Signed)
 Given persistent symptoms I would recommend we repeat colonoscopy

## 2023-12-01 LAB — CANCER ANTIGEN 19-9: CA 19-9: 10 U/mL (ref 0–35)

## 2023-12-01 LAB — CA 125: Cancer Antigen (CA) 125: 18.1 U/mL (ref 0.0–38.1)

## 2023-12-01 NOTE — Telephone Encounter (Signed)
 Called and left a voicemail asking for a return call to discuss.  ?

## 2023-12-03 ENCOUNTER — Telehealth: Payer: Self-pay | Admitting: Cardiology

## 2023-12-03 NOTE — Telephone Encounter (Signed)
 Pt requesting that Dr. Diona Browner look at lab results that pt had done with PCP on 1/7. Pt really like for MCV count to be looked at especially. Please advise

## 2023-12-04 ENCOUNTER — Telehealth: Payer: Self-pay | Admitting: *Deleted

## 2023-12-04 ENCOUNTER — Encounter: Payer: Self-pay | Admitting: Cardiology

## 2023-12-04 MED ORDER — TRAMADOL HCL 50 MG PO TABS
50.0000 mg | ORAL_TABLET | Freq: Four times a day (QID) | ORAL | 0 refills | Status: DC | PRN
Start: 2023-12-04 — End: 2024-02-05

## 2023-12-04 MED ORDER — SENNOSIDES-DOCUSATE SODIUM 8.6-50 MG PO TABS: 2.0000 | ORAL_TABLET | Freq: Every day | ORAL | 0 refills | Status: AC

## 2023-12-04 NOTE — Telephone Encounter (Signed)
 Patient informed and verbalized understanding of plan. Patient states she is going to call Dr.Lukings office and reestablish care there if possible for primary care. Patient states at last visit with Dr.McDowell depending on her lab work specifically her MCV and cholesterol would determine if she may be a candidate for the self pay cardiac calcium scoring testing.  She would like Dr.McDowell to take a look at this portion to see if this is now a option for her.

## 2023-12-04 NOTE — Telephone Encounter (Signed)
 Patient scheduled appointment with Toni Amend Grooms PA to establish care

## 2023-12-04 NOTE — Patient Instructions (Signed)
 Preparing for your Surgery  Plan for surgery on January 05, 2024 with Dr. [name] at [facility]. You will be scheduled for robotic assisted laparoscopic bilateral salpingo-oophorectomy (removal of both ovaries and fallopian tubes), possible robotic assisted total hysterectomy (removal of the uterus and cervix), possible staging if a precancer or cancer is seen.   Pre-operative Testing -You will receive a phone call from presurgical testing at [facility] to arrange for a pre-operative appointment and lab work.  -Bring your insurance card, copy of an advanced directive if applicable, medication list  -At that visit, you will be asked to sign a consent for a possible blood transfusion in case a transfusion becomes necessary during surgery.  The need for a blood transfusion is rare but having consent is a necessary part of your care.     -You should not be taking blood thinners or aspirin at least ten days prior to surgery unless instructed by your surgeon.  -Do not take supplements such as fish oil (omega 3), red yeast rice, turmeric before your surgery. STOP TAKING AT LEAST 10 DAYS BEFORE SURGERY. You want to avoid medications with aspirin in them including headache powders such as BC or Goody's), Excedrin migraine.  Day Before Surgery at Home -You will be asked to take in a light diet the day before surgery. You will be advised you can have clear liquids up until 3 hours before your surgery.    Eat a light diet the day before surgery.  Examples including soups, broths, toast, yogurt, mashed potatoes.  AVOID GAS PRODUCING FOODS AND BEVERAGES. Things to avoid include carbonated beverages (fizzy beverages, sodas), raw fruits and raw vegetables (uncooked), or beans.   If your bowels are filled with gas, your surgeon will have difficulty visualizing your pelvic organs which increases your surgical risks.  Your role in recovery Your role is to become active as soon as  directed by your doctor, while still giving yourself time to heal.  Rest when you feel tired. You will be asked to do the following in order to speed your recovery:  - Cough and breathe deeply. This helps to clear and expand your lungs and can prevent pneumonia after surgery.  - STAY ACTIVE WHEN YOU GET HOME. Do mild physical activity. Walking or moving your legs help your circulation and body functions return to normal. Do not try to get up or walk alone the first time after surgery.   -If you develop swelling on one leg or the other, pain in the back of your leg, redness/warmth in one of your legs, please call the office or go to the Emergency Room to have a doppler to rule out a blood clot. For shortness of breath, chest pain-seek care in the Emergency Room as soon as possible. - Actively manage your pain. Managing your pain lets you move in comfort. We will ask you to rate your pain on a scale of zero to 10. It is your responsibility to tell your doctor or nurse where and how much you hurt so your pain can be treated.  Special Considerations -If you are diabetic, you may be placed on insulin after surgery to have closer control over your blood sugars to promote healing and recovery.  This does not mean that you will be discharged on insulin.  If applicable, your oral antidiabetics will be resumed when you are tolerating a solid diet.  -Your final pathology results from surgery should be available around one week after  surgery and the results will be relayed to you when available.  -Dr. Antionette Char is the surgeon that assists your GYN Oncologist with surgery.  If you end up staying the night, the next day after your surgery you will either see Dr. Pricilla Holm, Dr. Alvester Morin, or Dr. Antionette Char.  -FMLA forms can be faxed to [phone] and please allow 5-7 business days for completion.  Pain Management After Surgery -You will be prescribed your pain medication and bowel regimen medications  before surgery so that you can have these available when you are discharged from the hospital. The pain medication is for use ONLY AFTER surgery and a new prescription will not be given.   -Make sure that you have Tylenol and Ibuprofen IF YOU ARE ABLE TO TAKE THESE MEDICATIONS at home to use on a regular basis after surgery for pain control. We recommend alternating the medications every hour to six hours since they work differently and are processed in the body differently for pain relief.  -Review the attached handout on narcotic use and their risks and side effects.   Bowel Regimen -You can continue with your daily miralax or we can prescribe Sennakot-S to take nightly to prevent constipation especially if you are taking the narcotic pain medication intermittently.  It is important to prevent constipation and drink adequate amounts of liquids.   Risks of Surgery Risks of surgery are low but include bleeding, infection, damage to surrounding structures, re-operation, blood clots, and very rarely death.   Blood Transfusion Information (For the consent to be signed before surgery)  We will be checking your blood type before surgery so in case of emergencies, we will know what type of blood you would need.                                            WHAT IS A BLOOD TRANSFUSION?  A transfusion is the replacement of blood or some of its parts. Blood is made up of multiple cells which provide different functions. Red blood cells carry oxygen and are used for blood loss replacement. White blood cells fight against infection. Platelets control bleeding. Plasma helps clot blood. Other blood products are available for specialized needs, such as hemophilia or other clotting disorders. BEFORE THE TRANSFUSION  Who gives blood for transfusions?  You may be able to donate blood to be used at a later date on yourself (autologous donation). Relatives can be asked to donate blood. This is generally not  any safer than if you have received blood from a stranger. The same precautions are taken to ensure safety when a relative's blood is donated. Healthy volunteers who are fully evaluated to make sure their blood is safe. This is blood bank blood. Transfusion therapy is the safest it has ever been in the practice of medicine. Before blood is taken from a donor, a complete history is taken to make sure that person has no history of diseases nor engages in risky social behavior (examples are intravenous drug use or sexual activity with multiple partners). The donor's travel history is screened to minimize risk of transmitting infections, such as malaria. The donated blood is tested for signs of infectious diseases, such as HIV and hepatitis. The blood is then tested to be sure it is compatible with you in order to minimize the chance of a transfusion reaction. If you or a relative  donates blood, this is often done in anticipation of surgery and is not appropriate for emergency situations. It takes many days to process the donated blood. RISKS AND COMPLICATIONS Although transfusion therapy is very safe and saves many lives, the main dangers of transfusion include:  Getting an infectious disease. Developing a transfusion reaction. This is an allergic reaction to something in the blood you were given. Every precaution is taken to prevent this. The decision to have a blood transfusion has been considered carefully by your caregiver before blood is given. Blood is not given unless the benefits outweigh the risks.  AFTER SURGERY INSTRUCTIONS  Return to work: 4-6 weeks if applicable  Activity: 1. Be up and out of the bed during the day.  Take a nap if needed.  You may walk up steps but be careful and use the hand rail.  Stair climbing will tire you more than you think, you may need to stop part way and rest.   2. No lifting or straining for 6 weeks over 10 pounds. No pushing, pulling, straining for 6  weeks.  3. No driving for 2-44 days when the following criteria have been met: Do not drive if you are taking narcotic pain medicine and make sure that your reaction time has returned.   4. You can shower as soon as the next day after surgery. Shower daily.  Use your regular soap and water (not directly on the incision) and pat your incision(s) dry afterwards; don't rub.  No tub baths or submerging your body in water until cleared by your surgeon. If you have the soap that was given to you by pre-surgical testing that was used before surgery, you do not need to use it afterwards because this can irritate your incisions.   5. No sexual activity and nothing in the vagina for 6 weeks, 12 weeks if you have a hysterectomy (removal of the uterus and cervix).  6. You may experience a small amount of clear drainage from your incisions, which is normal.  If the drainage persists, increases, or changes color please call the office.  7. Do not use creams, lotions, or ointments such as neosporin on your incisions after surgery until advised by your surgeon because they can cause removal of the dermabond glue on your incisions.    8. You may experience vaginal spotting after surgery or when the stitches at the top of the vagina begin to dissolve (if you have a hysterectomy).  The spotting is normal but if you experience heavy bleeding, call our office.  9. Take Tylenol or ibuprofen first for pain if you are able to take these medications and only use narcotic pain medication for severe pain not relieved by the Tylenol or Ibuprofen.  Monitor your Tylenol intake to a max of 4,000 mg in a 24 hour period. You can alternate these medications after surgery.  Diet: 1. Low sodium Heart Healthy Diet is recommended but you are cleared to resume your normal (before surgery) diet after your procedure.  2. It is safe to use a laxative, such as Miralax or Colace, if you have difficulty moving your bowels before surgery. You  have been prescribed Sennakot-S to take at bedtime every evening after surgery to keep bowel movements regular and to prevent constipation.    Wound Care: 1. Keep clean and dry.  Shower daily.  Reasons to call the Doctor: Fever - Oral temperature greater than 100.4 degrees Fahrenheit Foul-smelling vaginal discharge Difficulty urinating Nausea and vomiting Increased  pain at the site of the incision that is unrelieved with pain medicine. Difficulty breathing with or without chest pain New calf pain especially if only on one side Sudden, continuing increased vaginal bleeding with or without clots.   Contacts: For questions or concerns you should contact:  Dr. [name] at [phone]  Warner Mccreedy, NP at [phone]  After Hours: call [phone] and have the GYN Oncologist paged/contacted (after 5 pm or on the weekends). You will speak with an after hours RN and let he or she know you have had surgery.  Messages sent via mychart are for non-urgent matters and are not responded to after hours so for urgent needs, please call the after hours number.

## 2023-12-04 NOTE — Telephone Encounter (Signed)
 MyChart message sent to patient.

## 2023-12-04 NOTE — Telephone Encounter (Signed)
 Spoke with Victoria [name] who called the office stating her labs came back from her appointment with Dr. Alvester Morin on Monday, 3/24. Pt would like a provider to go over the results that she received in MyChart related to her tumor markers. Pt states they appear to be normal but was concerned about one that is elevated within the normal range? Advised patient that her message would be relayed to provider and the office would call back once the provider has reviewed her labs. Pt thanked the office for calling.

## 2023-12-04 NOTE — Telephone Encounter (Signed)
 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  Cardiologist instructed patient to bring this up with primary care.  And also send [place] a copy of that message.  It would be fine to send the patient a message that if she is concerned about the MCV we can see her at the office here discuss it and order appropriate tests.  We have not seen the patient for 3 years for primary care so therefore would need to see her before ordering any tests.  If she defers and decides to go elsewhere for that did not-then that is understood-just document that we offered

## 2023-12-04 NOTE — Telephone Encounter (Signed)
 Spoke with Ms. Lampley and relayed message from Warner Mccreedy, NP that pt's tumor markers have been reviewed and they are all normal. The values can fluctuate a few points at given times. The markers are never at 0. Pt felt reassured and thanked the office for calling.

## 2023-12-07 ENCOUNTER — Encounter: Payer: Self-pay | Admitting: *Deleted

## 2023-12-07 ENCOUNTER — Encounter: Payer: Self-pay | Admitting: Internal Medicine

## 2023-12-07 ENCOUNTER — Encounter: Payer: Self-pay | Admitting: Psychiatry

## 2023-12-07 ENCOUNTER — Encounter: Payer: Self-pay | Admitting: Cardiology

## 2023-12-08 ENCOUNTER — Encounter: Payer: Self-pay | Admitting: Psychiatry

## 2023-12-08 HISTORY — PX: OOPHORECTOMY: SHX86

## 2023-12-14 ENCOUNTER — Encounter: Payer: Self-pay | Admitting: Internal Medicine

## 2023-12-14 ENCOUNTER — Encounter

## 2023-12-15 ENCOUNTER — Encounter: Admitting: Internal Medicine

## 2023-12-15 ENCOUNTER — Ambulatory Visit (AMBULATORY_SURGERY_CENTER): Admitting: Internal Medicine

## 2023-12-15 ENCOUNTER — Encounter: Payer: Self-pay | Admitting: Internal Medicine

## 2023-12-15 VITALS — BP 119/92 | HR 81 | Temp 98.0°F | Resp 13 | Ht 63.0 in | Wt 138.0 lb

## 2023-12-15 DIAGNOSIS — K648 Other hemorrhoids: Secondary | ICD-10-CM | POA: Diagnosis not present

## 2023-12-15 DIAGNOSIS — R194 Change in bowel habit: Secondary | ICD-10-CM

## 2023-12-15 DIAGNOSIS — K5730 Diverticulosis of large intestine without perforation or abscess without bleeding: Secondary | ICD-10-CM

## 2023-12-15 DIAGNOSIS — R1084 Generalized abdominal pain: Secondary | ICD-10-CM

## 2023-12-15 DIAGNOSIS — R140 Abdominal distension (gaseous): Secondary | ICD-10-CM

## 2023-12-15 MED ORDER — SODIUM CHLORIDE 0.9 % IV SOLN: 500.0000 mL | Freq: Once | INTRAVENOUS | Status: DC

## 2023-12-15 MED ORDER — HYDROCORTISONE ACE-PRAMOXINE 1-1 % EX CREA
1.0000 | TOPICAL_CREAM | Freq: Two times a day (BID) | CUTANEOUS | 1 refills | Status: AC
Start: 2023-12-15 — End: ?

## 2023-12-15 NOTE — Op Note (Signed)
 [facility] Patient Name: Victoria [name] Procedure Date: 12/15/2023 3:06 PM MRN: [id number] Endoscopist: Beverley Fiedler , MD, [id number] Age: 63 Referring MD:  Date of Birth: 1961/05/20 Gender: Female Account #: [id number] Procedure:                Colonoscopy Indications:              Change in bowel habits, abdominal bloating,                            abdominal pain, nausea, SIBO + with improvement                            post rifaximin but abdominal symptoms persistent                            and intermittent, planned bilateral oophorectomy                            for later this month Medicines:                Monitored Anesthesia Care Procedure:                Pre-Anesthesia Assessment:                           - Prior to the procedure, a History and Physical                            was performed, and patient medications and                            allergies were reviewed. The patient's tolerance of                            previous anesthesia was also reviewed. The risks                            and benefits of the procedure and the sedation                            options and risks were discussed with the patient.                            All questions were answered, and informed consent                            was obtained. Prior Anticoagulants: The patient has                            taken no anticoagulant or antiplatelet agents. ASA                            Grade Assessment: II - A patient with mild systemic  disease. After reviewing the risks and benefits,                            the patient was deemed in satisfactory condition to                            undergo the procedure.                           After obtaining informed consent, the colonoscope                            was passed under direct vision. Throughout the                            procedure, the patient's blood pressure, pulse, and                             oxygen saturations were monitored continuously. The                            Olympus Scope [id number] was introduced through the                            anus and advanced to the cecum, identified by its                            appearance. The colonoscopy was performed without                            difficulty. The patient tolerated the procedure                            well. The quality of the bowel preparation was                            good. The terminal ileum, ileocecal valve,                            appendiceal orifice, and rectum were photographed. Scope In: 3:31:28 PM Scope Out: 3:46:50 PM Scope Withdrawal Time: 0 hours 12 minutes 25 seconds  Total Procedure Duration: 0 hours 15 minutes 22 seconds  Findings:                 The digital rectal exam was normal.                           The terminal ileum appeared normal.                           Multiple small-mouthed diverticula were found in                            the sigmoid colon and distal descending colon.  Internal hemorrhoids were found during                            retroflexion. The hemorrhoids were medium-sized.                           The exam was otherwise without abnormality. Complications:            No immediate complications. Estimated Blood Loss:     Estimated blood loss: none. Impression:               - The examined portion of the ileum was normal.                           - Mild diverticulosis in the sigmoid colon and in                            the distal descending colon.                           - Internal hemorrhoids.                           - The examination was otherwise normal.                           - No evidence of ileitis or colitis.                           - No specimens collected. Recommendation:           - Patient has a contact number available for                            emergencies. The signs and  symptoms of potential                            delayed complications were discussed with the                            patient. Return to normal activities tomorrow.                            Written discharge instructions were provided to the                            patient.                           - Resume previous diet.                           - Continue present medications.                           - Repeat colonoscopy in 10 years for screening  purposes. Beverley Fiedler, MD 12/15/2023 3:53:33 PM This report has been signed electronically.

## 2023-12-15 NOTE — Progress Notes (Unsigned)
 GASTROENTEROLOGY PROCEDURE H&P NOTE   Primary Care Physician: Babs Sciara, MD    Reason for Procedure:  Change in bowel habits, abd bloating, hx of ischemic colitis, no response to SIBO treatment  Plan:    colonoscopy  Patient is appropriate for endoscopic procedure(s) in the ambulatory ([facility]) setting.  The nature of the procedure, as well as the risks, benefits, and alternatives were carefully and thoroughly reviewed with the patient. Ample time for discussion and questions allowed. The patient understood, was satisfied, and agreed to proceed.     HPI: Victoria [name] is a 63 y.o. female who presents for colonoscopy.  Medical history as below.  Tolerated the prep.  No recent chest pain or shortness of breath.  No abdominal pain today.  Past Medical History:  Diagnosis Date   Arthritis    Asthma    Diverticulosis    GERD (gastroesophageal reflux disease)    Headache    Hypertension    IBS (irritable bowel syndrome)    Osteoporosis    Palpitations    PSVT (paroxysmal supraventricular tachycardia) (HCC)    Scoliosis    Vertigo     Past Surgical History:  Procedure Laterality Date   abdominal hernia     ? umbilical   COLONOSCOPY  01/29/2021   SHOULDER ARTHROSCOPY Right    TONSILLECTOMY     VEIN SURGERY Bilateral 1994    Prior to Admission medications   Medication Sig Start Date End Date Taking? Authorizing Provider  ALPRAZolam Prudy Feeler) 0.5 MG tablet Take 0.5 mg by mouth at bedtime as needed.   Yes [provider]  Calcium Carb-Cholecalciferol (CALCIUM 1000 + D PO) Take by mouth 2 (two) times daily.   Yes [provider]  Cranberry-Vitamin C-Probiotic (AZO CRANBERRY) 250-30 MG TABS 2 tablets daily as needed. 05/10/21  Yes [provider]  DEXILANT 60 MG capsule TAKE 1 CAPSULE BY MOUTH EVERY DAY 02/03/20  Yes Ladona Ridgel, Malena M, DO  dicyclomine (BENTYL) 20 MG tablet Take 1 tablet (20 mg total) by mouth 3 (three) times daily before meals.  10/13/23  Yes Damiya Sandefur, Carie Caddy, MD  fluticasone (FLONASE) 50 MCG/ACT nasal spray Place 2 sprays into both nostrils daily. 08/29/20  Yes Ladona Ridgel, Malena M, DO  Magnesium 200 MG TABS Take 1 tablet by mouth daily.   Yes [provider]  metroNIDAZOLE (METROGEL) 1 % gel APPLY EXTERNALLY TO FACE EVERY NIGHT AT qhs prn. Needs appt for more refills. 08/29/20  Yes Ladona Ridgel, Malena M, DO  Multiple Vitamin (MULTIVITAMIN) tablet Take 1 tablet by mouth daily.   Yes [provider]  polyethylene glycol (MIRALAX / GLYCOLAX) packet Take 17 g by mouth daily.   Yes [provider]  PREVIDENT 5000 SENSITIVE 1.1-5 % PSTE  05/26/18  Yes [provider]  psyllium (METAMUCIL) 58.6 % powder Take 1 packet by mouth daily.    Yes [provider]  cyclobenzaprine (FLEXERIL) 10 MG tablet Take 1 tablet (10 mg total) by mouth 3 (three) times daily as needed for muscle spasms. 06/30/19   [name], MD  senna-docusate (SENOKOT-S) 8.6-50 MG tablet Take 2 tablets by mouth at bedtime. For AFTER surgery, do not take if having diarrhea 12/04/23   Warner Mccreedy D, NP  traMADol (ULTRAM) 50 MG tablet Take 1 tablet (50 mg total) by mouth every 6 (six) hours as needed for severe pain (pain score 7-10). For AFTER surgery only, do not take and drive 12/16/79   Doylene Bode, NP  Current Outpatient Medications  Medication Sig Dispense Refill   ALPRAZolam (XANAX) 0.5 MG tablet Take 0.5 mg by mouth at bedtime as needed.     Calcium Carb-Cholecalciferol (CALCIUM 1000 + D PO) Take by mouth 2 (two) times daily.     Cranberry-Vitamin C-Probiotic (AZO CRANBERRY) 250-30 MG TABS 2 tablets daily as needed.     DEXILANT 60 MG capsule TAKE 1 CAPSULE BY MOUTH EVERY DAY 90 capsule 0   dicyclomine (BENTYL) 20 MG tablet Take 1 tablet (20 mg total) by mouth 3 (three) times daily before meals. 90 tablet 11   fluticasone (FLONASE) 50 MCG/ACT nasal spray Place 2 sprays into both nostrils daily. 16 g 3    Magnesium 200 MG TABS Take 1 tablet by mouth daily.     metroNIDAZOLE (METROGEL) 1 % gel APPLY EXTERNALLY TO FACE EVERY NIGHT AT qhs prn. Needs appt for more refills. 55 g 0   Multiple Vitamin (MULTIVITAMIN) tablet Take 1 tablet by mouth daily.     polyethylene glycol (MIRALAX / GLYCOLAX) packet Take 17 g by mouth daily.     PREVIDENT 5000 SENSITIVE 1.1-5 % PSTE   5   psyllium (METAMUCIL) 58.6 % powder Take 1 packet by mouth daily.      cyclobenzaprine (FLEXERIL) 10 MG tablet Take 1 tablet (10 mg total) by mouth 3 (three) times daily as needed for muscle spasms. 270 tablet 1   senna-docusate (SENOKOT-S) 8.6-50 MG tablet Take 2 tablets by mouth at bedtime. For AFTER surgery, do not take if having diarrhea 30 tablet 0   traMADol (ULTRAM) 50 MG tablet Take 1 tablet (50 mg total) by mouth every 6 (six) hours as needed for severe pain (pain score 7-10). For AFTER surgery only, do not take and drive 10 tablet 0   Current Facility-Administered Medications  Medication Dose Route Frequency Provider Last Rate Last Admin   0.9 %  sodium chloride infusion  500 mL Intravenous Once Mallisa Alameda, Carie Caddy, MD        Allergies as of 12/15/2023 - Review Complete 12/15/2023  Allergen Reaction Noted   Sulfamethoxazole-trimethoprim Anaphylaxis 11/13/2020    Family History  Problem Relation Age of Onset   Urticaria Mother    Allergic rhinitis Mother    Hypertension Mother    GER disease Mother    Hiatal hernia Mother    Gallbladder disease Mother    Heart attack Mother    Heart disease Mother    Allergic rhinitis Father    Urticaria Father    Heart attack Father    Stroke Father    Scoliosis Father    Heart disease Father    Cancer Maternal Grandmother        type unknown   Heart attack Maternal Grandfather    Rectal cancer Paternal Grandmother    Heart attack Paternal Grandfather    Asthma Daughter    Allergies Daughter    Asthma Son    Allergies Son    Breast cancer Neg Hx    Ovarian cancer Neg Hx     Uterine cancer Neg Hx    Colon cancer Neg Hx    Esophageal cancer Neg Hx    Stomach cancer Neg Hx     Social History   Socioeconomic History   Marital status: Married    Spouse name: Not on file   Number of children: 2   Years of education: Not on file   Highest education level: Not on file  Occupational History   Occupation:  retired  Tobacco Use   Smoking status: Never    Passive exposure: Past   Smokeless tobacco: Never  Vaping Use   Vaping status: Never Used  Substance and Sexual Activity   Alcohol use: Yes    Comment: occasional   Drug use: No   Sexual activity: Yes    Partners: Male  Other Topics Concern   Not on file  Social History Narrative   Not on file   Social Drivers of Health   Financial Resource Strain: Not on file  Food Insecurity: No Food Insecurity (11/27/2023)   Hunger Vital Sign    Worried About Running Out of Food in the Last Year: Never true    Ran Out of Food in the Last Year: Never true  Transportation Needs: No Transportation Needs (11/27/2023)   PRAPARE - Administrator, Civil Service (Medical): No    Lack of Transportation (Non-Medical): No  Physical Activity: Not on file  Stress: Not on file  Social Connections: Not on file  Intimate Partner Violence: Not At Risk (11/27/2023)   Humiliation, Afraid, Rape, and Kick questionnaire    Fear of Current or Ex-Partner: No    Emotionally Abused: No    Physically Abused: No    Sexually Abused: No    Physical Exam: Vital signs in last 24 hours: @BP  (!) 154/76   Pulse 80   Temp 98 F (36.7 C) (Temporal)   Ht 5\' 3"  (1.6 m)   Wt 138 lb (62.6 kg)   SpO2 100%   BMI 24.45 kg/m  GEN: NAD EYE: Sclerae anicteric ENT: MMM CV: Non-tachycardic Pulm: CTA b/l GI: Soft, NT/ND NEURO:  Alert & Oriented x 3   Erick Blinks, MD [facility]  12/15/2023 3:12 PM

## 2023-12-15 NOTE — Progress Notes (Unsigned)
 To pacu, VSS. Report to RN.tb

## 2023-12-15 NOTE — Patient Instructions (Addendum)
 Resume previous diet Continue present medications  There were no colon polyps seen today!  You will need another screening colonoscopy in 10 years, you will receive a letter at that time when you are due for the procedure.    Please call us at [phone] if you have a change in bowel habits, change in family history of colo-rectal cancer, rectal bleeding or other GI concern before that time.  Handouts/information given for diverticulosis and hemorrhoids  YOU HAD AN ENDOSCOPIC PROCEDURE TODAY AT THE [facility]:   Refer to the procedure report that was given to you for any specific questions about what was found during the examination.  If the procedure report does not answer your questions, please call your gastroenterologist to clarify.  If you requested that your care partner not be given the details of your procedure findings, then the procedure report has been included in a sealed envelope for you to review at your convenience later.  YOU SHOULD EXPECT: Some feelings of bloating in the abdomen. Passage of more gas than usual.  Walking can help get rid of the air that was put into your GI tract during the procedure and reduce the bloating. If you had a lower endoscopy (such as a colonoscopy or flexible sigmoidoscopy) you may notice spotting of blood in your stool or on the toilet paper. If you underwent a bowel prep for your procedure, you may not have a normal bowel movement for a few days.  Please Note:  You might notice some irritation and congestion in your nose or some drainage.  This is from the oxygen used during your procedure.  There is no need for concern and it should clear up in a day or so.  SYMPTOMS TO REPORT IMMEDIATELY:  Following lower endoscopy (colonoscopy):  Excessive amounts of blood in the stool  Significant tenderness or worsening of abdominal pains  Swelling of the abdomen that is new, acute  Fever of 100F or higher  For urgent or emergent issues, a  gastroenterologist can be reached at any hour by calling [phone]. Do not use MyChart messaging for urgent concerns.   DIET:  We do recommend a small meal at first, but then you may proceed to your regular diet.  Drink plenty of fluids but you should avoid alcoholic beverages for 24 hours.  ACTIVITY:  You should plan to take it easy for the rest of today and you should NOT DRIVE or use heavy machinery until tomorrow (because of the sedation medicines used during the test).    FOLLOW UP: Our staff will call the number listed on your records the next business day following your procedure.  We will call around 7:15- 8:00 am to check on you and address any questions or concerns that you may have regarding the information given to you following your procedure. If we do not reach you, we will leave a message.     SIGNATURES/CONFIDENTIALITY: You and/or your care partner have signed paperwork which will be entered into your electronic medical record.  These signatures attest to the fact that that the information above on your After Visit Summary has been reviewed and is understood.  Full responsibility of the confidentiality of this discharge information lies with you and/or your care-partner.

## 2023-12-16 ENCOUNTER — Telehealth: Payer: Self-pay

## 2023-12-16 NOTE — Telephone Encounter (Signed)
 Left message on machine.

## 2023-12-21 ENCOUNTER — Telehealth: Payer: Self-pay | Admitting: Internal Medicine

## 2023-12-21 NOTE — Telephone Encounter (Signed)
 Patient calls stating that Dr Bridgett Camps told her that he wanted to see her in the office for her continued symptoms before her upcoming surgery. Patient says that she had "a terrible night last night. Awful gas and diarrhea and abdominal pain." Advised that unfortunately, I do not have availability to see Dr Bridgett Camps until June so will have to send him a request for advice if he needs to see her beforehand. Patient says she would like to be seen soon, possibly next week and would like medication for SIBO beforehand if possible. Advised that again, I will have to get orders from Dr Bridgett Camps since I do not see any recommendations currently. Advised patient I will reach back out to her when I hear back from Dr Bridgett Camps. She is aware he is currently out of the office for the week.

## 2023-12-21 NOTE — Telephone Encounter (Signed)
 Patient called and stated that she had spoke with Dr. Bridgett Camps and he advise her to call and make an appointment with him. Patient stated that she was currently having problems with build of of gas and hard and pain trying to pass gas. Patient also stated that she was feeling nauseous and in a lot of pain. Patient stated that she has a surgery coming up on April 29 th. Patient is requesting a call back. Please advise.

## 2023-12-28 MED ORDER — AMOXICILLIN-POT CLAVULANATE 875-125 MG PO TABS: 1.0000 | ORAL_TABLET | Freq: Two times a day (BID) | ORAL | 0 refills | Status: AC

## 2023-12-28 NOTE — Telephone Encounter (Signed)
 I do think she needs to proceed with the ovarian surgery  Reasonable to retreat SIBO given her positive breath test previously Augmentin  875 mg twice daily x 7 days

## 2023-12-28 NOTE — Telephone Encounter (Signed)
 I have spoken to patient to advise of Dr Marietta Shorter recommendations. Patient verbalizes understanding. Has multiple complaints and questions. Wants to know side effects of Augmentin  and why augmentin  instead of xifaxan. She also wonders if this can cause yeast infection. Discusses that sometimes even diflucan does not help her. She is not sure she wants to take the antibiotics prior to surgery in the case she were to get yeast infection. I advised that if she decides to wait until after surgery to take augmentin  course, she should discuss this with her surgeon to make sure okay prior to doing so. She also questions if her  bloating and constipation could be coming from her ovarian issue. Discussed that this is a possibility. She verbalizes understanding.

## 2023-12-30 ENCOUNTER — Telehealth: Payer: Self-pay

## 2023-12-30 ENCOUNTER — Other Ambulatory Visit (HOSPITAL_COMMUNITY): Payer: Self-pay

## 2023-12-30 NOTE — Telephone Encounter (Signed)
 Pharmacy Patient Advocate Encounter   Received notification from CoverMyMeds that prior authorization for Amoxicillin -Pot Clavulanate 875-125MG  tablets is required/requested.   Insurance verification completed.   The patient is insured through [facility] .   Per test claim: No PA required.

## 2023-12-30 NOTE — Progress Notes (Signed)
 COVID Vaccine Completed:  Date of COVID positive in last 90 days:  PCP - Charlotta Cook, MD Cardiologist - Teddie Favre, MD LOV 06/02/22  Chest x-ray -  EKG - 06/03/23 Epic Stress Test -  ECHO - 08/04/16 Epic Cardiac Cath -  Pacemaker/ICD device last checked: Spinal Cord Stimulator:  Bowel Prep -   Sleep Study -  CPAP -   Fasting Blood Sugar -  Checks Blood Sugar _____ times a day  Last dose of GLP1 agonist-  N/A GLP1 instructions:  Hold 7 days before surgery    Last dose of SGLT-2 inhibitors-  N/A SGLT-2 instructions:  Hold 3 days before surgery    Blood Thinner Instructions:  Last dose:   Time: Aspirin Instructions: Last Dose:  Activity level:  Can go up a flight of stairs and perform activities of daily living without stopping and without symptoms of chest pain or shortness of breath.  Able to exercise without symptoms  Unable to go up a flight of stairs without symptoms of     Anesthesia review:   Patient denies shortness of breath, fever, cough and chest pain at PAT appointment  Patient verbalized understanding of instructions that were given to them at the PAT appointment. Patient was also instructed that they will need to review over the PAT instructions again at home before surgery.

## 2023-12-31 ENCOUNTER — Telehealth: Payer: Self-pay | Admitting: Cardiology

## 2023-12-31 ENCOUNTER — Other Ambulatory Visit: Payer: Self-pay

## 2023-12-31 ENCOUNTER — Ambulatory Visit: Admitting: Gynecologic Oncology

## 2023-12-31 ENCOUNTER — Encounter (HOSPITAL_COMMUNITY)
Admission: RE | Admit: 2023-12-31 | Discharge: 2023-12-31 | Disposition: A | Discharge status: Home / Self Care | Source: Ambulatory Visit | Attending: Psychiatry | Admitting: Psychiatry

## 2023-12-31 ENCOUNTER — Encounter: Payer: Self-pay | Admitting: *Deleted

## 2023-12-31 ENCOUNTER — Encounter (HOSPITAL_COMMUNITY): Payer: Self-pay

## 2023-12-31 DIAGNOSIS — N83209 Unspecified ovarian cyst, unspecified side: Secondary | ICD-10-CM | POA: Insufficient documentation

## 2023-12-31 DIAGNOSIS — Z01812 Encounter for preprocedural laboratory examination: Secondary | ICD-10-CM | POA: Insufficient documentation

## 2023-12-31 DIAGNOSIS — Z01818 Encounter for other preprocedural examination: Secondary | ICD-10-CM | POA: Diagnosis not present

## 2023-12-31 HISTORY — DX: Anxiety disorder, unspecified: F41.9

## 2023-12-31 LAB — COMPREHENSIVE METABOLIC PANEL WITH GFR
ALT: 21 U/L (ref 0–44)
AST: 19 U/L (ref 15–41)
Albumin: 4.3 g/dL (ref 3.5–5.0)
Alkaline Phosphatase: 58 U/L (ref 38–126)
Anion gap: 7 (ref 5–15)
BUN: 14 mg/dL (ref 6–20)
CO2: 28 mmol/L (ref 22–32)
Calcium: 9.4 mg/dL (ref 8.9–10.3)
Chloride: 104 mmol/L (ref 98–111)
Creatinine, Ser: 0.75 mg/dL (ref 0.44–1.00)
GFR, Estimated: >60 mL/min (ref >60)
Glucose, Bld: 96 mg/dL (ref 70–99)
Potassium: 3.6 mmol/L (ref 3.5–5.1)
Sodium: 139 mmol/L (ref 135–145)
Total Bilirubin: 1 mg/dL (ref 0.0–1.2)
Total Protein: 7 g/dL (ref 6.5–8.1)

## 2023-12-31 LAB — CBC
HCT: 46.5 % — ABNORMAL HIGH (ref 36.0–46.0)
Hemoglobin: 14.9 g/dL (ref 12.0–15.0)
MCH: 33.4 pg (ref 26.0–34.0)
MCHC: 32 g/dL (ref 30.0–36.0)
MCV: 104.3 fL — ABNORMAL HIGH (ref 80.0–100.0)
Platelets: 248 10*3/uL (ref 150–400)
RBC: 4.46 MIL/uL (ref 3.87–5.11)
RDW: 11.9 % (ref 11.5–15.5)
WBC: 6.4 10*3/uL (ref 4.0–10.5)
nRBC: 0 % (ref 0.0–0.2)

## 2023-12-31 NOTE — Patient Instructions (Signed)
 SURGICAL WAITING ROOM VISITATION  Patients having surgery or a procedure may have no more than 2 support people in the waiting area - these visitors may rotate.    Children under the age of 56 must have an adult with them who is not the patient.  Due to an increase in RSV and influenza rates and associated hospitalizations, children ages 43 and under may not visit patients in [facility] hospitals.  Visitors with respiratory illnesses are discouraged from visiting and should remain at home.  If the patient needs to stay at the hospital during part of their recovery, the visitor guidelines for inpatient rooms apply. Pre-op nurse will coordinate an appropriate time for 1 support person to accompany patient in pre-op.  This support person may not rotate.    Please refer to the [facility] website for the visitor guidelines for Inpatients (after your surgery is over and you are in a regular room).     Your procedure is scheduled on: 01/05/24   Report to [facility] Main Entrance    Report to admitting at 7:45 AM   Call this number if you have problems the morning of surgery [phone]   Do not eat food :After Midnight.   After Midnight you may have the following liquids until 7:00 AM DAY OF SURGERY  Water Non-Citrus Juices (without pulp, NO RED-Apple, White grape, White cranberry) Black Coffee (NO MILK/CREAM OR CREAMERS, sugar ok)  Clear Tea (NO MILK/CREAM OR CREAMERS, sugar ok) regular and decaf                             Plain Jell-O (NO RED)                                           Fruit ices (not with fruit pulp, NO RED)                                     Popsicles (NO RED)                                                               Sports drinks like Gatorade (NO RED)          If you have questions, please contact your surgeon's office.   FOLLOW BOWEL PREP AND ANY ADDITIONAL PRE OP INSTRUCTIONS YOU RECEIVED FROM YOUR SURGEON'S OFFICE!!!     Oral Hygiene is  also important to reduce your risk of infection.                                    Remember - BRUSH YOUR TEETH THE MORNING OF SURGERY WITH YOUR REGULAR TOOTHPASTE  DENTURES WILL BE REMOVED PRIOR TO SURGERY PLEASE DO NOT APPLY "Poly grip" OR ADHESIVES!!!   Stop all vitamins and herbal supplements 7 days before surgery.   Take these medicines the morning of surgery with A SIP OF WATER: Tylenol , Dexilant , Diltiazem , Flonase   You may not have any metal on your body including hair pins, jewelry, and body piercing             Do not wear make-up, lotions, powders, perfumes/cologne, or deodorant  Do not wear nail polish including gel and S&S, artificial/acrylic nails, or any other type of covering on natural nails including finger and toenails. If you have artificial nails, gel coating, etc. that needs to be removed by a nail salon please have this removed prior to surgery or surgery may need to be canceled/ delayed if the surgeon/ anesthesia feels like they are unable to be safely monitored.   Do not shave  48 hours prior to surgery.    Do not bring valuables to the hospital. [facility] IS NOT             RESPONSIBLE   FOR VALUABLES.   Contacts, glasses, dentures or bridgework may not be worn into surgery.  DO NOT BRING YOUR HOME MEDICATIONS TO THE HOSPITAL. PHARMACY WILL DISPENSE MEDICATIONS LISTED ON YOUR MEDICATION LIST TO YOU DURING YOUR ADMISSION IN THE HOSPITAL!    Patients discharged on the day of surgery will not be allowed to drive home.  Someone NEEDS to stay with you for the first 24 hours after anesthesia.              Please read over the following fact sheets you were given: IF YOU HAVE QUESTIONS ABOUT YOUR PRE-OP INSTRUCTIONS PLEASE CALL [name]   If you received a COVID test during your pre-op visit  it is requested that you wear a mask when out in public, stay away from anyone that may not be feeling well and notify your surgeon if  you develop symptoms. If you test positive for Covid or have been in contact with anyone that has tested positive in the last 10 days please notify you surgeon.    [place] - Preparing for Surgery Before surgery, you can play an important role.  Because skin is not sterile, your skin needs to be as free of germs as possible.  You can reduce the number of germs on your skin by washing with CHG (chlorahexidine gluconate) soap before surgery.  CHG is an antiseptic cleaner which kills germs and bonds with the skin to continue killing germs even after washing. Please DO NOT use if you have an allergy  to CHG or antibacterial soaps.  If your skin becomes reddened/irritated stop using the CHG and inform your nurse when you arrive at Short Stay. Do not shave (including legs and underarms) for at least 48 hours prior to the first CHG shower.  You may shave your face/neck.  Please follow these instructions carefully:  1.  Shower with CHG Soap the night before surgery and the  morning of surgery.  2.  If you choose to wash your hair, wash your hair first as usual with your normal  shampoo.  3.  After you shampoo, rinse your hair and body thoroughly to remove the shampoo.                             4.  Use CHG as you would any other liquid soap.  You can apply chg directly to the skin and wash.  Gently with a scrungie or clean washcloth.  5.  Apply the CHG Soap to your body ONLY FROM THE NECK DOWN.   Do   not use on face/  open                           Wound or open sores. Avoid contact with eyes, ears mouth and   genitals (private parts).                       Wash face,  Genitals (private parts) with your normal soap.             6.  Wash thoroughly, paying special attention to the area where your    surgery  will be performed.  7.  Thoroughly rinse your body with warm water from the neck down.  8.  DO NOT shower/wash with your normal soap after using and rinsing off the CHG Soap.                9.  Pat  yourself dry with a clean towel.            10.  Wear clean pajamas.            11.  Place clean sheets on your bed the night of your first shower and do not  sleep with pets. Day of Surgery : Do not apply any lotions/deodorants the morning of surgery.  Please wear clean clothes to the hospital/surgery center.  FAILURE TO FOLLOW THESE INSTRUCTIONS MAY RESULT IN THE CANCELLATION OF YOUR SURGERY  PATIENT SIGNATURE_________________________________  NURSE SIGNATURE__________________________________  ________________________________________________________________________ WHAT IS A BLOOD TRANSFUSION? Blood Transfusion Information  A transfusion is the replacement of blood or some of its parts. Blood is made up of multiple cells which provide different functions. Red blood cells carry oxygen and are used for blood loss replacement. White blood cells fight against infection. Platelets control bleeding. Plasma helps clot blood. Other blood products are available for specialized needs, such as hemophilia or other clotting disorders. BEFORE THE TRANSFUSION  Who gives blood for transfusions?  Healthy volunteers who are fully evaluated to make sure their blood is safe. This is blood bank blood. Transfusion therapy is the safest it has ever been in the practice of medicine. Before blood is taken from a donor, a complete history is taken to make sure that person has no history of diseases nor engages in risky social behavior (examples are intravenous drug use or sexual activity with multiple partners). The donor's travel history is screened to minimize risk of transmitting infections, such as malaria. The donated blood is tested for signs of infectious diseases, such as HIV and hepatitis. The blood is then tested to be sure it is compatible with you in order to minimize the chance of a transfusion reaction. If you or a relative donates blood, this is often done in anticipation of surgery and is not  appropriate for emergency situations. It takes many days to process the donated blood. RISKS AND COMPLICATIONS Although transfusion therapy is very safe and saves many lives, the main dangers of transfusion include:  Getting an infectious disease. Developing a transfusion reaction. This is an allergic reaction to something in the blood you were given. Every precaution is taken to prevent this. The decision to have a blood transfusion has been considered carefully by your caregiver before blood is given. Blood is not given unless the benefits outweigh the risks. AFTER THE TRANSFUSION Right after receiving a blood transfusion, you will usually feel much better and more energetic. This is especially true if your red blood cells have gotten low (  anemic). The transfusion raises the level of the red blood cells which carry oxygen, and this usually causes an energy increase. The nurse administering the transfusion will monitor you carefully for complications. HOME CARE INSTRUCTIONS  No special instructions are needed after a transfusion. You may find your energy is better. Speak with your caregiver about any limitations on activity for underlying diseases you may have. SEEK MEDICAL CARE IF:  Your condition is not improving after your transfusion. You develop redness or irritation at the intravenous (IV) site. SEEK IMMEDIATE MEDICAL CARE IF:  Any of the following symptoms occur over the next 12 hours: Shaking chills. You have a temperature by mouth above 102 F (38.9 C), not controlled by medicine. Chest, back, or muscle pain. People around you feel you are not acting correctly or are confused. Shortness of breath or difficulty breathing. Dizziness and fainting. You get a rash or develop hives. You have a decrease in urine output. Your urine turns a dark color or changes to pink, red, or brown. Any of the following symptoms occur over the next 10 days: You have a temperature by mouth above 102 F  (38.9 C), not controlled by medicine. Shortness of breath. Weakness after normal activity. The white part of the eye turns yellow (jaundice). You have a decrease in the amount of urine or are urinating less often. Your urine turns a dark color or changes to pink, red, or brown. Document Released: 08/22/2000 Document Revised: 11/17/2011 Document Reviewed: 04/10/2008 [facility] Patient Information 2014 [place], [place].  _______________________________________________________________________

## 2023-12-31 NOTE — Telephone Encounter (Signed)
 New Message:    Patient called and wanted Dr McDowelll to know that she is scheduled to have  Laparoscopic Bilateral Salpingo- Oophorectomy on 01-05-24

## 2024-01-04 ENCOUNTER — Telehealth: Payer: Self-pay | Admitting: *Deleted

## 2024-01-04 NOTE — Telephone Encounter (Signed)
 Telephone call to check on pre-operative status.  Patient compliant with pre-operative instructions.  Reinforced nothing to eat after midnight. Clear liquids until 0415. Patient to arrive at 0515.  No questions or concerns voiced.  Instructed to call for any needs.

## 2024-01-04 NOTE — Telephone Encounter (Signed)
 Attempted to reach patient for pre-op call. Left voicemail requesting call back.

## 2024-01-04 NOTE — Anesthesia Preprocedure Evaluation (Signed)
 Anesthesia Evaluation  Patient identified by MRN, date of birth, ID band Patient awake    Reviewed: Allergy  & Precautions, NPO status , Patient's Chart, lab work & pertinent test results  History of Anesthesia Complications Negative for: history of anesthetic complications  Airway Mallampati: II  TM Distance: >3 FB Neck ROM: Full    Dental no notable dental hx. (+) Teeth Intact, Dental Advisory Given   Pulmonary asthma    Pulmonary exam normal breath sounds clear to auscultation       Cardiovascular hypertension, Pt. on medications (-) angina (-) Past MI Normal cardiovascular exam Rhythm:Regular Rate:Normal  Nl Echo 2017   Neuro/Psych  PSYCHIATRIC DISORDERS Anxiety        GI/Hepatic ,GERD  Medicated and Controlled,,  Endo/Other    Renal/GU Lab Results      Component                Value               Date                      NA                       139                 12/31/2023                CL                       104                 12/31/2023                K                        3.6                 12/31/2023                CO2                      28                  12/31/2023                BUN                      14                  12/31/2023                CREATININE               0.75                12/31/2023                GFRNONAA                 >60                 12/31/2023                CALCIUM                  9.4  12/31/2023                ALBUMIN                  4.3                 12/31/2023                GLUCOSE                  96                  12/31/2023              7 cm L ovarian mass    Musculoskeletal  (+) Arthritis ,    Abdominal   Peds  Hematology Lab Results      Component                Value               Date                      WBC                      6.4                 12/31/2023                HGB                      14.9                 12/31/2023                HCT                      46.5 (H)            12/31/2023                MCV                      104.3 (H)           12/31/2023                PLT                      248                 12/31/2023              Anesthesia Other Findings All: sulfa  Reproductive/Obstetrics                             Anesthesia Physical Anesthesia Plan  ASA: 3  Anesthesia Plan: General   Post-op Pain Management: Lidocaine  infusion*, Tylenol  PO (pre-op)*, Precedex and Toradol IV (intra-op)*   Induction: Intravenous  PONV Risk Score and Plan: 3 and 4 or greater and Treatment may vary due to age or medical condition, Ondansetron, Midazolam, Dexamethasone , Scopolamine patch - Pre-op, TIVA and Propofol infusion  Airway Management Planned: Oral ETT  Additional Equipment: None  Intra-op Plan:   Post-operative Plan: Extubation in OR  Informed Consent: I have reviewed the patients History and Physical, chart, labs and discussed the procedure including the risks,  benefits and alternatives for the proposed anesthesia with the patient or authorized representative who has indicated his/her understanding and acceptance.     Dental advisory given  Plan Discussed with: CRNA and Surgeon  Anesthesia Plan Comments: (TIVA GA)       Anesthesia Quick Evaluation

## 2024-01-05 ENCOUNTER — Ambulatory Visit (HOSPITAL_COMMUNITY)
Admission: RE | Admit: 2024-01-05 | Discharge: 2024-01-05 | Disposition: A | Discharge status: Home / Self Care | Attending: Psychiatry | Admitting: Psychiatry

## 2024-01-05 ENCOUNTER — Encounter (HOSPITAL_COMMUNITY)
Admission: RE | Disposition: A | Discharge status: Home / Self Care | Payer: Self-pay | Source: Home / Self Care | Attending: Psychiatry

## 2024-01-05 ENCOUNTER — Encounter (HOSPITAL_COMMUNITY): Payer: Self-pay | Admitting: Psychiatry

## 2024-01-05 ENCOUNTER — Other Ambulatory Visit: Payer: Self-pay

## 2024-01-05 ENCOUNTER — Ambulatory Visit (HOSPITAL_COMMUNITY): Payer: Self-pay | Admitting: Anesthesiology

## 2024-01-05 DIAGNOSIS — D271 Benign neoplasm of left ovary: Secondary | ICD-10-CM | POA: Insufficient documentation

## 2024-01-05 DIAGNOSIS — I10 Essential (primary) hypertension: Secondary | ICD-10-CM | POA: Insufficient documentation

## 2024-01-05 DIAGNOSIS — D270 Benign neoplasm of right ovary: Secondary | ICD-10-CM | POA: Insufficient documentation

## 2024-01-05 DIAGNOSIS — N83202 Unspecified ovarian cyst, left side: Secondary | ICD-10-CM | POA: Diagnosis not present

## 2024-01-05 DIAGNOSIS — K219 Gastro-esophageal reflux disease without esophagitis: Secondary | ICD-10-CM | POA: Insufficient documentation

## 2024-01-05 DIAGNOSIS — N83209 Unspecified ovarian cyst, unspecified side: Secondary | ICD-10-CM

## 2024-01-05 DIAGNOSIS — J45909 Unspecified asthma, uncomplicated: Secondary | ICD-10-CM | POA: Diagnosis not present

## 2024-01-05 HISTORY — PX: ROBOTIC ASSISTED BILATERAL SALPINGO OOPHERECTOMY: SHX6078

## 2024-01-05 LAB — TYPE AND SCREEN
ABO/RH(D): A POS
Antibody Screen: NEGATIVE

## 2024-01-05 LAB — ABO/RH: ABO/RH(D): A POS

## 2024-01-05 SURGERY — SALPINGO-OOPHORECTOMY, BILATERAL, ROBOT-ASSISTED
Anesthesia: General | Laterality: Bilateral

## 2024-01-05 MED ORDER — LACTATED RINGERS IV SOLN: INTRAVENOUS | Status: DC | PRN

## 2024-01-05 MED ORDER — PROPOFOL 500 MG/50ML IV EMUL
INTRAVENOUS | Status: DC | PRN
  Administered 2024-01-05: 125 ug/kg/min via INTRAVENOUS

## 2024-01-05 MED ORDER — FENTANYL CITRATE (PF) 100 MCG/2ML IJ SOLN
INTRAMUSCULAR | Status: DC | PRN
  Administered 2024-01-05 (×3): 50 ug via INTRAVENOUS

## 2024-01-05 MED ORDER — DEXAMETHASONE SODIUM PHOSPHATE 10 MG/ML IJ SOLN: 4.0000 mg | INTRAMUSCULAR | Status: DC

## 2024-01-05 MED ORDER — LIDOCAINE HCL (CARDIAC) PF 100 MG/5ML IV SOSY
PREFILLED_SYRINGE | INTRAVENOUS | Status: DC | PRN
  Administered 2024-01-05: 100 mg via INTRAVENOUS

## 2024-01-05 MED ORDER — LIDOCAINE HCL (PF) 2 % IJ SOLN
INTRAMUSCULAR | Status: AC
  Filled 2024-01-05: qty 10

## 2024-01-05 MED ORDER — STERILE WATER FOR IRRIGATION IR SOLN
Status: DC | PRN
  Administered 2024-01-05: 1000 mL

## 2024-01-05 MED ORDER — KETOROLAC TROMETHAMINE 30 MG/ML IJ SOLN
INTRAMUSCULAR | Status: AC
  Filled 2024-01-05: qty 1

## 2024-01-05 MED ORDER — PROPOFOL 10 MG/ML IV BOLUS
INTRAVENOUS | Status: DC | PRN
  Administered 2024-01-05: 130 mg via INTRAVENOUS

## 2024-01-05 MED ORDER — LACTATED RINGERS IV SOLN: INTRAVENOUS | Status: DC

## 2024-01-05 MED ORDER — BUPIVACAINE HCL (PF) 0.25 % IJ SOLN
INTRAMUSCULAR | Status: DC | PRN
  Administered 2024-01-05: 25 mL

## 2024-01-05 MED ORDER — SUGAMMADEX SODIUM 200 MG/2ML IV SOLN
INTRAVENOUS | Status: DC | PRN
  Administered 2024-01-05: 120 mg via INTRAVENOUS

## 2024-01-05 MED ORDER — LIDOCAINE HCL (PF) 2 % IJ SOLN
INTRAMUSCULAR | Status: DC | PRN
  Administered 2024-01-05: 1.5 mg/kg/h via INTRADERMAL

## 2024-01-05 MED ORDER — LIDOCAINE HCL (PF) 2 % IJ SOLN
INTRAMUSCULAR | Status: AC
  Filled 2024-01-05: qty 5

## 2024-01-05 MED ORDER — PROPOFOL 500 MG/50ML IV EMUL
INTRAVENOUS | Status: AC
  Filled 2024-01-05: qty 50

## 2024-01-05 MED ORDER — ACETAMINOPHEN 500 MG PO TABS
1000.0000 mg | ORAL_TABLET | ORAL | Status: AC
  Administered 2024-01-05: 1000 mg via ORAL
  Filled 2024-01-05: qty 2

## 2024-01-05 MED ORDER — DEXMEDETOMIDINE HCL IN NACL 80 MCG/20ML IV SOLN
INTRAVENOUS | Status: DC | PRN
Start: 2024-01-05 — End: 2024-01-05
  Administered 2024-01-05 (×2): 8 ug via INTRAVENOUS

## 2024-01-05 MED ORDER — OXYCODONE HCL 5 MG PO TABS
ORAL_TABLET | ORAL | Status: AC
  Filled 2024-01-05: qty 1

## 2024-01-05 MED ORDER — ORAL CARE MOUTH RINSE: 15.0000 mL | Freq: Once | OROMUCOSAL | Status: AC

## 2024-01-05 MED ORDER — DEXAMETHASONE SODIUM PHOSPHATE 10 MG/ML IJ SOLN
INTRAMUSCULAR | Status: DC | PRN
  Administered 2024-01-05: 6 mg via INTRAVENOUS

## 2024-01-05 MED ORDER — FENTANYL CITRATE (PF) 250 MCG/5ML IJ SOLN
INTRAMUSCULAR | Status: AC
  Filled 2024-01-05: qty 5

## 2024-01-05 MED ORDER — ROCURONIUM BROMIDE 10 MG/ML (PF) SYRINGE
PREFILLED_SYRINGE | INTRAVENOUS | Status: DC | PRN
  Administered 2024-01-05: 50 mg via INTRAVENOUS
  Administered 2024-01-05 (×2): 10 mg via INTRAVENOUS

## 2024-01-05 MED ORDER — HYDROMORPHONE HCL 1 MG/ML IJ SOLN: 0.2500 mg | INTRAMUSCULAR | Status: DC | PRN

## 2024-01-05 MED ORDER — OXYCODONE HCL 5 MG PO TABS
5.0000 mg | ORAL_TABLET | Freq: Once | ORAL | Status: AC | PRN
  Administered 2024-01-05: 5 mg via ORAL

## 2024-01-05 MED ORDER — PHENYLEPHRINE 80 MCG/ML (10ML) SYRINGE FOR IV PUSH (FOR BLOOD PRESSURE SUPPORT)
PREFILLED_SYRINGE | INTRAVENOUS | Status: DC | PRN
  Administered 2024-01-05: 160 ug via INTRAVENOUS

## 2024-01-05 MED ORDER — BUPIVACAINE HCL (PF) 0.25 % IJ SOLN
INTRAMUSCULAR | Status: AC
  Filled 2024-01-05: qty 30

## 2024-01-05 MED ORDER — LACTATED RINGERS IR SOLN
Status: DC | PRN
  Administered 2024-01-05: 1

## 2024-01-05 MED ORDER — SODIUM CHLORIDE (PF) 0.9 % IJ SOLN
INTRAMUSCULAR | Status: AC
  Filled 2024-01-05: qty 20

## 2024-01-05 MED ORDER — MIDAZOLAM HCL 2 MG/2ML IJ SOLN
INTRAMUSCULAR | Status: DC | PRN
  Administered 2024-01-05: 2 mg via INTRAVENOUS

## 2024-01-05 MED ORDER — SCOPOLAMINE 1 MG/3DAYS TD PT72
1.0000 | MEDICATED_PATCH | TRANSDERMAL | Status: DC
  Administered 2024-01-05: 1.5 mg via TRANSDERMAL
  Filled 2024-01-05: qty 1

## 2024-01-05 MED ORDER — PROPOFOL 1000 MG/100ML IV EMUL
INTRAVENOUS | Status: AC
  Filled 2024-01-05: qty 100

## 2024-01-05 MED ORDER — ONDANSETRON HCL 4 MG/2ML IJ SOLN
INTRAMUSCULAR | Status: DC | PRN
  Administered 2024-01-05: 4 mg via INTRAVENOUS

## 2024-01-05 MED ORDER — DEXAMETHASONE SODIUM PHOSPHATE 10 MG/ML IJ SOLN
INTRAMUSCULAR | Status: AC
  Filled 2024-01-05: qty 1

## 2024-01-05 MED ORDER — OXYCODONE HCL 5 MG/5ML PO SOLN: 5.0000 mg | Freq: Once | ORAL | Status: AC | PRN

## 2024-01-05 MED ORDER — HEPARIN SODIUM (PORCINE) 5000 UNIT/ML IJ SOLN
5000.0000 [IU] | INTRAMUSCULAR | Status: AC
  Administered 2024-01-05: 5000 [IU] via SUBCUTANEOUS
  Filled 2024-01-05: qty 1

## 2024-01-05 MED ORDER — PHENYLEPHRINE 80 MCG/ML (10ML) SYRINGE FOR IV PUSH (FOR BLOOD PRESSURE SUPPORT)
PREFILLED_SYRINGE | INTRAVENOUS | Status: AC
  Filled 2024-01-05: qty 10

## 2024-01-05 MED ORDER — MIDAZOLAM HCL 2 MG/2ML IJ SOLN
INTRAMUSCULAR | Status: AC
  Filled 2024-01-05: qty 2

## 2024-01-05 MED ORDER — ROCURONIUM BROMIDE 10 MG/ML (PF) SYRINGE
PREFILLED_SYRINGE | INTRAVENOUS | Status: AC
  Filled 2024-01-05: qty 10

## 2024-01-05 MED ORDER — ONDANSETRON HCL 4 MG/2ML IJ SOLN: 4.0000 mg | Freq: Once | INTRAMUSCULAR | Status: DC | PRN

## 2024-01-05 MED ORDER — DEXMEDETOMIDINE HCL IN NACL 80 MCG/20ML IV SOLN
INTRAVENOUS | Status: AC
  Filled 2024-01-05: qty 20

## 2024-01-05 MED ORDER — KETOROLAC TROMETHAMINE 30 MG/ML IJ SOLN
30.0000 mg | Freq: Once | INTRAMUSCULAR | Status: AC | PRN
  Administered 2024-01-05: 30 mg via INTRAVENOUS

## 2024-01-05 MED ORDER — CHLORHEXIDINE GLUCONATE 0.12 % MT SOLN
15.0000 mL | Freq: Once | OROMUCOSAL | Status: AC
  Administered 2024-01-05: 15 mL via OROMUCOSAL

## 2024-01-05 MED ORDER — PROPOFOL 10 MG/ML IV BOLUS
INTRAVENOUS | Status: AC
  Filled 2024-01-05: qty 20

## 2024-01-05 MED ORDER — ONDANSETRON HCL 4 MG/2ML IJ SOLN
INTRAMUSCULAR | Status: AC
  Filled 2024-01-05: qty 2

## 2024-01-05 SURGICAL SUPPLY — 107 items
APPLICATOR SURGIFLO ENDO (HEMOSTASIS) IMPLANT
ATTRACTOMAT 16X20 MAGNETIC DRP (DRAPES) IMPLANT
BAG COUNTER SPONGE SURGICOUNT (BAG) IMPLANT
BAG LAPAROSCOPIC 12 15 PORT 16 (BASKET) IMPLANT
BLADE EXTENDED COATED 6.5IN (ELECTRODE) ×1 IMPLANT
BLADE SURG SZ10 CARB STEEL (BLADE) IMPLANT
CELLS DAT CNTRL 66122 CELL SVR (MISCELLANEOUS) IMPLANT
CHLORAPREP W/TINT 26 (MISCELLANEOUS) ×2 IMPLANT
CLIP TI LARGE 6 (CLIP) ×1 IMPLANT
CLIP TI MEDIUM 6 (CLIP) ×1 IMPLANT
CLIP TI MEDIUM LARGE 6 (CLIP) ×1 IMPLANT
CNTNR URN SCR LID CUP LEK RST (MISCELLANEOUS) IMPLANT
COVER BACK TABLE 60X90IN (DRAPES) ×2 IMPLANT
COVER SURGICAL LIGHT HANDLE (MISCELLANEOUS) ×2 IMPLANT
COVER TIP SHEARS 8 DVNC (MISCELLANEOUS) ×2 IMPLANT
DERMABOND ADVANCED .7 DNX12 (GAUZE/BANDAGES/DRESSINGS) ×2 IMPLANT
DRAPE ARM DVNC X/XI (DISPOSABLE) ×8 IMPLANT
DRAPE COLUMN DVNC XI (DISPOSABLE) ×2 IMPLANT
DRAPE INCISE IOBAN 66X45 STRL (DRAPES) IMPLANT
DRAPE SHEET LG 3/4 BI-LAMINATE (DRAPES) ×2 IMPLANT
DRAPE SURG IRRIG POUCH 19X23 (DRAPES) ×2 IMPLANT
DRAPE WARM FLUID 44X44 (DRAPES) ×1 IMPLANT
DRIVER NDL MEGA SUTCUT DVNCXI (INSTRUMENTS) ×1 IMPLANT
DRIVER NDLE MEGA SUTCUT DVNCXI (INSTRUMENTS) ×2 IMPLANT
DRSG OPSITE POSTOP 4X10 (GAUZE/BANDAGES/DRESSINGS) IMPLANT
DRSG OPSITE POSTOP 4X6 (GAUZE/BANDAGES/DRESSINGS) IMPLANT
DRSG OPSITE POSTOP 4X8 (GAUZE/BANDAGES/DRESSINGS) IMPLANT
ELECT PENCIL ROCKER SW 15FT (MISCELLANEOUS) IMPLANT
ELECT REM PT RETURN 15FT ADLT (MISCELLANEOUS) ×2 IMPLANT
FORCEPS BPLR FENES DVNC XI (FORCEP) ×2 IMPLANT
FORCEPS PROGRASP DVNC XI (FORCEP) ×2 IMPLANT
GAUZE 4X4 16PLY ~~LOC~~+RFID DBL (SPONGE) ×2 IMPLANT
GLOVE BIO SURGEON STRL SZ 6.5 (GLOVE) ×2 IMPLANT
GLOVE BIOGEL PI IND STRL 6.5 (GLOVE) ×4 IMPLANT
GLOVE BIOGEL PI MICRO STRL 6 (GLOVE) ×8 IMPLANT
GOWN STRL REUS W/ TWL LRG LVL3 (GOWN DISPOSABLE) ×8 IMPLANT
GRASPER SUT TROCAR 14GX15 (MISCELLANEOUS) IMPLANT
HEMOSTAT ARISTA ABSORB 3G PWDR (HEMOSTASIS) IMPLANT
HOLDER FOLEY CATH W/STRAP (MISCELLANEOUS) IMPLANT
IRRIGATION SUCT STRKRFLW 2 WTP (MISCELLANEOUS) ×2 IMPLANT
KIT BASIN OR (CUSTOM PROCEDURE TRAY) ×1 IMPLANT
KIT PROCEDURE DVNC SI (MISCELLANEOUS) IMPLANT
KIT TURNOVER KIT A (KITS) IMPLANT
LIGASURE IMPACT 36 18CM CVD LR (INSTRUMENTS) IMPLANT
LOOP VESSEL MAXI BLUE (MISCELLANEOUS) IMPLANT
MANIPULATOR ADVINCU DEL 3.0 PL (MISCELLANEOUS) IMPLANT
MANIPULATOR ADVINCU DEL 3.5 PL (MISCELLANEOUS) IMPLANT
MANIPULATOR UTERINE 4.5 ZUMI (MISCELLANEOUS) IMPLANT
NDL HYPO 21X1.5 SAFETY (NEEDLE) ×2 IMPLANT
NDL INSUFFLATION 14GA 120MM (NEEDLE) IMPLANT
NDL SPNL 20GX3.5 QUINCKE YW (NEEDLE) IMPLANT
NEEDLE HYPO 21X1.5 SAFETY (NEEDLE) ×2 IMPLANT
NEEDLE INSUFFLATION 14GA 120MM (NEEDLE) IMPLANT
NEEDLE SPNL 20GX3.5 QUINCKE YW (NEEDLE) IMPLANT
NS IRRIG 1000ML POUR BTL (IV SOLUTION) ×2 IMPLANT
OBTURATOR OPTICALSTD 8 DVNC (TROCAR) ×2 IMPLANT
PACK GENERAL/GYN (CUSTOM PROCEDURE TRAY) ×2 IMPLANT
PACK ROBOT GYN CUSTOM WL (TRAY / TRAY PROCEDURE) ×2 IMPLANT
PAD ARMBOARD POSITIONER FOAM (MISCELLANEOUS) ×2 IMPLANT
PAD POSITIONING PINK XL (MISCELLANEOUS) ×2 IMPLANT
PORT ACCESS TROCAR AIRSEAL 12 (TROCAR) IMPLANT
RELOAD PROXIMATE 75MM BLUE (ENDOMECHANICALS) IMPLANT
RELOAD PROXIMATE TA60MM BLUE (ENDOMECHANICALS) IMPLANT
RELOAD STAPLE 60 BLU REG PROX (ENDOMECHANICALS) IMPLANT
RELOAD STAPLE 75 3.8 BLU REG (ENDOMECHANICALS) IMPLANT
RETRACTOR WND ALEXIS 18 MED (MISCELLANEOUS) IMPLANT
RETRACTOR WND ALEXIS 25 LRG (MISCELLANEOUS) IMPLANT
RETRACTOR WOUND ALXS 25CM LRG (MISCELLANEOUS) IMPLANT
SCISSORS MNPLR CVD DVNC XI (INSTRUMENTS) ×2 IMPLANT
SCRUB CHG 4% DYNA-HEX 4OZ (MISCELLANEOUS) ×4 IMPLANT
SEAL UNIV 5-12 XI (MISCELLANEOUS) ×8 IMPLANT
SET TRI-LUMEN FLTR TB AIRSEAL (TUBING) ×2 IMPLANT
SHEET LAVH (DRAPES) ×1 IMPLANT
SLEEVE SUCTION CATH 165 (SLEEVE) ×1 IMPLANT
SOL PREP POV-IOD 4OZ 10% (MISCELLANEOUS) ×1 IMPLANT
SPIKE FLUID TRANSFER (MISCELLANEOUS) ×2 IMPLANT
SPONGE T-LAP 18X18 ~~LOC~~+RFID (SPONGE) IMPLANT
STAPLER GUN LINEAR PROX 60 (STAPLE) IMPLANT
STAPLER PROXIMATE 75MM BLUE (STAPLE) IMPLANT
STAPLER SKIN PROX WIDE 3.9 (STAPLE) IMPLANT
SURGIFLO W/THROMBIN 8M KIT (HEMOSTASIS) IMPLANT
SUT MNCRL AB 4-0 PS2 18 (SUTURE) ×4 IMPLANT
SUT PDS AB 1 TP1 54 (SUTURE) ×2 IMPLANT
SUT SILK 3 0 SH CR/8 (SUTURE) IMPLANT
SUT VIC AB 0 CT1 27XBRD ANTBC (SUTURE) IMPLANT
SUT VIC AB 0 CT1 36 (SUTURE) ×4 IMPLANT
SUT VIC AB 2-0 CT1 36 (SUTURE) ×2 IMPLANT
SUT VIC AB 2-0 CT1 TAPERPNT 27 (SUTURE) ×2 IMPLANT
SUT VIC AB 2-0 CT2 27 (SUTURE) IMPLANT
SUT VIC AB 2-0 SH 18 (SUTURE) IMPLANT
SUT VIC AB 2-0 SH 27X BRD (SUTURE) IMPLANT
SUT VIC AB 3-0 CTX 36 (SUTURE) IMPLANT
SUT VIC AB 3-0 SH 18 (SUTURE) IMPLANT
SUT VIC AB 3-0 SH 27X BRD (SUTURE) ×1 IMPLANT
SUT VIC AB 4-0 PS2 18 (SUTURE) ×4 IMPLANT
SUT VICRYL 0 27 CT2 27 ABS (SUTURE) ×1 IMPLANT
SYR 10ML LL (SYRINGE) IMPLANT
SYR 30ML LL (SYRINGE) ×2 IMPLANT
SYSTEM BAG RETRIEVAL 10MM (BASKET) IMPLANT
SYSTEM WOUND ALEXIS 18CM MED (MISCELLANEOUS) IMPLANT
TOWEL OR 17X26 10 PK STRL BLUE (TOWEL DISPOSABLE) ×1 IMPLANT
TRAP SPECIMEN MUCUS 40CC (MISCELLANEOUS) IMPLANT
TRAY FOLEY MTR SLVR 16FR STAT (SET/KITS/TRAYS/PACK) ×2 IMPLANT
TROCAR PORT AIRSEAL 5X120 (TROCAR) IMPLANT
UNDERPAD 30X36 HEAVY ABSORB (UNDERPADS AND DIAPERS) ×4 IMPLANT
WATER STERILE IRR 1000ML POUR (IV SOLUTION) ×1 IMPLANT
YANKAUER SUCT BULB TIP 10FT TU (MISCELLANEOUS) IMPLANT

## 2024-01-05 NOTE — Op Note (Signed)
 GYNECOLOGIC ONCOLOGY OPERATIVE NOTE  Date of Service: 01/05/2024  Preoperative Diagnosis: Left ovarian cyst  Postoperative Diagnosis: Same  Procedures: Robotic assisted bilateral salpingo-oophorectomy  Surgeon: Derrel Flies, MD  Assistants: Abdul Hodgkin, MD and (an MD assistant was necessary for tissue manipulation, management of robotic instrumentation, retraction and positioning due to the complexity of the case and hospital policies)  Anesthesia: General  Estimated Blood Loss: 25 mL    Fluids: 1000 ml, crystalloid  Urine Output: 250 ml, clear yellow  Findings: On entry to abdomen, normal upper abdominal survey with smooth diaphragm, liver, stomach and normal appearing omentum and bowel. Small adhesion of the omentum to the upper midline anterior abdominal wall. Otherwise no areas of concern for adhesions amongst the bowel. Physiologic adhesions of the appendix/cecum to the right lower quadrant and physiologic adhesions of the rectosigmoid colon to the left pelvic sidewall. Evidence of diverticulosis throughout colon. Approximately 7-8cm simple cystic mass of the left ovary. 2cm simple cyst of the right ovary. Normal small uterus. Intra-op gross evaluation of left cyst by pathology benign in appearance, frozen deferred.   Specimens:  ID Type Source Tests Collected by Time Destination  1 : Left Tube and Ovary Tissue PATH Gyn tumor resection SURGICAL PATHOLOGY Derrel Flies, MD 01/05/2024 [phone]   2 : Right Tube and Ovary Tissue PATH Gyn tumor resection SURGICAL PATHOLOGY Derrel Flies, MD 01/05/2024 [phone]   A : Pelvic Washing Body Fluid PATH Cytology Pelvic Washing CYTOLOGY - NON PAP Derrel Flies, MD 01/05/2024 0831     Complications:  None  Indications for Procedure: Victoria [name] is a 63 y.o. woman with a 7.3cm simple left ovarian cyst.  Prior to the procedure, all risks, benefits, and alternatives were discussed and informed surgical consent was signed.  Procedure:  Patient was taken to the operating room where general anesthesia was achieved.  She was positioned in dorsal lithotomy and prepped and draped.  A foley catheter was inserted into the bladder. A hulka manipulator was secured in the cervix.    A 12 mm incision was made in the left upper quadrant near Palmer's point.  The abdomen was entered with a 5 mm OptiView trocar under direct visualization.  The abdomen was insufflated, the patient placed in steep Trendelenburg, and additional trocars were placed as follows: an 8mm robotic trocar inferior to the umbilicus, one 8 mm robotic trocar in the right abdomen, and one 8 mm robotic trocar in the left abdomen.  The left upper quadrant trocar was removed and replaced with a 12 mm airseal trocar.  All trocars were placed under direct visualization.  The bowels were moved into the upper abdomen.  The DaVinci robotic surgical system was brought to the patient's bedside and docked.  Pelvic washings were obtained. The left retroperitoneum entered.  The left ureter was identified.  The left infundibulopelvic ligament was isolated, cauterized, and transected.  The broad ligament was incised to the uterine cornu.  The utero-ovarian ligament and the proximal fallopian tube were isolated, cauterized, and transected.  The same procedure was performed on the contralateral side.  Both specimens were placed into separate Endo-Catch bags and removed through the 10 mm trocar.  The pelvis was irrigated and all operative sites were found to be hemostatic.  All instruments were removed and the robot was taken from the patient's bedside. The fascia at the 12 mm incision was closed with 0 Vicryl with the assistance of a PMI device. The abdomen was desufflated and all ports were removed. The  skin at all incisions was closed with 4-0 Vicryl to reapproximate the subcutaneous tissue and 4-0 monocryl in a subcuticular fashion followed by surgical glue.  Patient tolerated the procedure well.  Sponge, lap, and instrument counts were correct.  No perioperative antibiotic prophylaxis was indicated for this procedure.  She was extubated and taken to the PACU in stable condition.  Derrel Flies, MD Gynecologic Oncology

## 2024-01-05 NOTE — H&P (Signed)
 Brief Pre-operative History & Physical  Patient name: Victoria [name] CSN: [id number] MRN: [id number] Admit Date: 01/05/2024 Date of Surgery: 01/05/2024 Performing Service: Gynecology   Code Status: Not on file    Assessment & Plan    Victoria [name] is a 63 y.o. female with LEFT OVARIAN CYST, who presents for: Procedure(s) (LRB): SALPINGO-OOPHORECTOMY, BILATERAL, ROBOT-ASSISTED (Bilateral) HYSTERECTOMY, TOTAL, ROBOT-ASSISTED (N/A).   Consent obtained in office is accurate. Risks, benefits, and alternatives to surgery were reviewed, and all questions were answered.  Proceed to the OR as planned.     History of Present Illness:  Victoria [name] is a 63 y.o. female with LEFT OVARIAN CYST. She was recently seen in clinic, where a detailed HPI can be found. She was noted to benefit from: Procedure(s) (LRB): SALPINGO-OOPHORECTOMY, BILATERAL, ROBOT-ASSISTED (Bilateral) HYSTERECTOMY, TOTAL, ROBOT-ASSISTED (N/A).   Medical History Past Medical History:  Diagnosis Date   Anxiety    Arthritis    Asthma    Diverticulosis    GERD (gastroesophageal reflux disease)    Headache    Hypertension    IBS (irritable bowel syndrome)    Osteoporosis    Palpitations    PSVT (paroxysmal supraventricular tachycardia) (HCC)    Scoliosis    Vertigo    Surgical History Past Surgical History:  Procedure Laterality Date   abdominal hernia     ? umbilical   COLONOSCOPY  01/29/2021   SHOULDER ARTHROSCOPY Right    TONSILLECTOMY     VEIN SURGERY Bilateral 1994   Allergies Sulfamethoxazole-trimethoprim  Medications   Current Facility-Administered Medications  Medication Dose Route Frequency Provider Last Rate Last Admin   dexamethasone  (DECADRON ) injection 4 mg  4 mg Intravenous On Call to OR Cross, Melissa D, NP       lactated ringers infusion   Intravenous Continuous Conard Decent, MD 10 mL/hr at 01/05/24 0652 New Bag at 01/05/24 2952   scopolamine (TRANSDERM-SCOP) 1 MG/3DAYS 1.5 mg  1  patch Transdermal On Call to OR Cross, Melissa D, NP   1.5 mg at 01/05/24 8413    Vital Signs BP (!) 143/88   Pulse 80   Temp 98.4 F (36.9 C) (Oral)   Resp 16   Ht 5\' 3"  (1.6 m)   Wt 131 lb (59.4 kg)   SpO2 100%   BMI 23.21 kg/m  Facility age limit for growth %iles is 20 years. Facility age limit for growth %iles is 20 years..   Physical Exam General: Well developed, appears stated age, in no acute distress  Mental status: Alert and oriented x3 Cardiovascular: Normal Pulmonary: Symmetric chest rise, unlabored breathing Relevant System for Surgery: Surgical site examination deferred to the OR   Labs and Studies: Lab Results  Component Value Date   WBC 6.4 12/31/2023   HGB 14.9 12/31/2023   HCT 46.5 (H) 12/31/2023   PLT 248 12/31/2023    No results found for: "INR", "APTT" \

## 2024-01-05 NOTE — Discharge Instructions (Addendum)
 AFTER SURGERY INSTRUCTIONS   Return to work: 4-6 weeks if applicable   Activity: 1. Be up and out of the bed during the day.  Take a nap if needed.  You may walk up steps but be careful and use the hand rail.  Stair climbing will tire you more than you think, you may need to stop part way and rest.    2. No lifting or straining for 6 weeks over 10 pounds. No pushing, pulling, straining for 6 weeks.   3. No driving for 9-81 days when the following criteria have been met: Do not drive if you are taking narcotic pain medicine and make sure that your reaction time has returned.    4. You can shower as soon as the next day after surgery. Shower daily.  Use your regular soap and water (not directly on the incision) and pat your incision(s) dry afterwards; don't rub.  No tub baths or submerging your body in water until cleared by your surgeon. If you have the soap that was given to you by pre-surgical testing that was used before surgery, you do not need to use it afterwards because this can irritate your incisions.    5. No sexual activity and nothing in the vagina for 6 weeks, 12 weeks if you have a hysterectomy (removal of the uterus and cervix).   6. You may experience a small amount of clear drainage from your incisions, which is normal.  If the drainage persists, increases, or changes color please call the office.   7. Do not use creams, lotions, or ointments such as neosporin on your incisions after surgery until advised by your surgeon because they can cause removal of the dermabond glue on your incisions.     8. You may experience vaginal spotting after surgery or when the stitches at the top of the vagina begin to dissolve (if you have a hysterectomy).  The spotting is normal but if you experience heavy bleeding, call our office.   9. Take Tylenol or ibuprofen first for pain if you are able to take these medications and only use narcotic pain medication for severe pain not relieved by the  Tylenol or Ibuprofen.  Monitor your Tylenol intake to a max of 4,000 mg in a 24 hour period. You can alternate these medications after surgery.   Diet: 1. Low sodium Heart Healthy Diet is recommended but you are cleared to resume your normal (before surgery) diet after your procedure.   2. It is safe to use a laxative, such as Miralax or Colace, if you have difficulty moving your bowels before surgery. You have been prescribed Sennakot-S to take at bedtime every evening after surgery to keep bowel movements regular and to prevent constipation.     Wound Care: 1. Keep clean and dry.  Shower daily.   Reasons to call the Doctor: Fever - Oral temperature greater than 100.4 degrees Fahrenheit Foul-smelling vaginal discharge Difficulty urinating Nausea and vomiting Increased pain at the site of the incision that is unrelieved with pain medicine. Difficulty breathing with or without chest pain New calf pain especially if only on one side Sudden, continuing increased vaginal bleeding with or without clots.   Contacts: For questions or concerns you should contact:   Dr. [name] at [phone]   Warner Mccreedy, NP at [phone]   After Hours: call [phone] and have the GYN Oncologist paged/contacted (after 5 pm or on the weekends). You will speak with an after hours RN and  let he or she know you have had surgery.   Messages sent via mychart are for non-urgent matters and are not responded to after hours so for urgent needs, please call the after hours number.

## 2024-01-05 NOTE — Transfer of Care (Signed)
 Immediate Anesthesia Transfer of Care Note  Patient: Victoria [name]  Procedure(s) Performed: SALPINGO-OOPHORECTOMY, BILATERAL, ROBOT-ASSISTED (Bilateral)  Patient Location: PACU  Anesthesia Type:General  Level of Consciousness: drowsy  Airway & Oxygen Therapy: Patient Spontanous Breathing and Patient connected to face mask oxygen  Post-op Assessment: Report given to RN and Post -op Vital signs reviewed and stable  Post vital signs: Reviewed and stable  Last Vitals:  Vitals Value Taken Time  BP 123/76 01/05/24 0937  Temp    Pulse 80 01/05/24 0940  Resp 12 01/05/24 0940  SpO2 100 % 01/05/24 0940  Vitals shown include unfiled device data.  Last Pain:  Vitals:   01/05/24 0636  TempSrc:   PainSc: 0-No pain         Complications: No notable events documented.

## 2024-01-05 NOTE — Anesthesia Procedure Notes (Signed)
 Procedure Name: Intubation Date/Time: 01/05/2024 7:44 AM  Performed by: Vella Gey, CRNAPre-anesthesia Checklist: Patient identified, Emergency Drugs available, Suction available and Patient being monitored Patient Re-evaluated:Patient Re-evaluated prior to induction Oxygen Delivery Method: Circle system utilized Preoxygenation: Pre-oxygenation with 100% oxygen Induction Type: IV induction Ventilation: Mask ventilation without difficulty Laryngoscope Size: Miller and 2 Grade View: Grade I Tube type: Oral Tube size: 7.0 mm Number of attempts: 1 Airway Equipment and Method: Stylet and Oral airway Placement Confirmation: ETT inserted through vocal cords under direct vision, positive ETCO2 and breath sounds checked- equal and bilateral Secured at: 21 cm Tube secured with: Tape Dental Injury: Teeth and Oropharynx as per pre-operative assessment

## 2024-01-05 NOTE — Anesthesia Postprocedure Evaluation (Signed)
 Anesthesia Post Note  Patient: Victoria [name]  Procedure(s) Performed: SALPINGO-OOPHORECTOMY, BILATERAL, ROBOT-ASSISTED (Bilateral)     Patient location during evaluation: PACU Anesthesia Type: General Level of consciousness: awake and alert Pain management: pain level controlled Vital Signs Assessment: post-procedure vital signs reviewed and stable Respiratory status: spontaneous breathing, nonlabored ventilation, respiratory function stable and patient connected to nasal cannula oxygen Cardiovascular status: blood pressure returned to baseline and stable Postop Assessment: no apparent nausea or vomiting Anesthetic complications: no  No notable events documented.  Last Vitals:  Vitals:   01/05/24 1004 01/05/24 1033  BP:  (!) 141/81  Pulse: 81 62  Resp: 19 18  Temp:  36.5 C  SpO2: 91% 100%    Last Pain:  Vitals:   01/05/24 1033  TempSrc: Oral  PainSc: 4                  Rosalita Combe

## 2024-01-06 ENCOUNTER — Telehealth: Payer: Self-pay | Admitting: *Deleted

## 2024-01-06 ENCOUNTER — Encounter (HOSPITAL_COMMUNITY): Payer: Self-pay | Admitting: Psychiatry

## 2024-01-06 NOTE — Telephone Encounter (Signed)
 Attempted to reach patient for post op call. Left voicemail requesting call back.

## 2024-01-06 NOTE — Telephone Encounter (Signed)
 Spoke with Ms. Neibauer spouse Sammie Crigler and her daughter Orelia Binet this afternoon. Daughter states she is eating, drinking and urinating well accept she is having a little urine leakage after emptying. Advised family this could be from swelling with having the catheter in during surgery and to monitor this and if it worsens to call the office.  She has not had a BM yet but is passing gas. She is taking senokot as prescribed and encouraged her to drink plenty of water. She denies fever or chills. Incisions are dry and intact. She rates her pain 5/10. Her pain is controlled with tylenol  and ibuprofen.     Instructed to call office with any fever, chills, purulent drainage, uncontrolled pain or any other questions or concerns. Patient's family verbalizes understanding.   Pt and family aware of post op appointments as well as the office number [phone] and after hours number [phone] to call if she has any questions or concerns

## 2024-01-07 LAB — SURGICAL PATHOLOGY

## 2024-01-07 LAB — CYTOLOGY - NON PAP

## 2024-01-11 ENCOUNTER — Encounter: Payer: Self-pay | Admitting: Psychiatry

## 2024-01-12 ENCOUNTER — Telehealth: Payer: Self-pay

## 2024-01-12 NOTE — Telephone Encounter (Signed)
 S/P  Robotic assisted bilateral salpingo-oophorectomy on 4/29 with Dr.Newton  Pt called with an update since her surgery. She states she has been gradually moving around the house and walking outside.No heavy lifting. She states at the end of the day she fills nagging spasm type pain in her stomach. 5-7/10 on pain scale. She does pass a lot of gas.Still taking Senakot to help with BM's.She alternates Tylenol  with Advil as needed.Uses heat/ice. Also reports fatigue often in the evenings.  No fever/chills, eating/drinking fine, incisions dry/intact.   Pt wanted advice on what provider thinks could be causing the pain in her stomach, and fatigue.   Advised to rest as much as possible, maybe walk every other day instead of every day. Monitor diet, staying away from gassy foods. Continue with heat.  Victoria Grieves NP notified for other advice

## 2024-01-14 ENCOUNTER — Encounter: Admitting: Internal Medicine

## 2024-01-15 ENCOUNTER — Telehealth: Payer: Self-pay

## 2024-01-15 NOTE — Telephone Encounter (Signed)
 Victoria [name] is aware of suggestions and advice from [facility]. She will monitor over the weekend and keep her post op appointment for Monday 5/12

## 2024-01-15 NOTE — Telephone Encounter (Addendum)
 Victoria [name] called to give an update from call earlier in the week. She states she has been walking at least 4 miles a day,(broken increments throughout the day). She has noticed tenderness/spasms in her belly, behind her bellybutton.It can be a stabbing pain at times.She is going to send a picture, through email, of her bellybutton reporting there is a lot of bruising. No swelling    At first it was at the end of the day, but now the feeling is intermittent throughout the day. The pain can be 7-8/10 on pain scale.Taking Tylenol  at least 2 times a day.  Everything else is fine. Eating/drinking, no fever/chills, incisions are dry/intact. Normal BM's (still taking Senakot). Reports passing a lot of gas.   Message sent to Vira Grieves NP fro review and advice. (Picture came through email, sent to [facility]). Pt has a post op appointment on Monday 5/12 with Dr.Newton.

## 2024-01-18 ENCOUNTER — Encounter: Payer: Self-pay | Admitting: Psychiatry

## 2024-01-18 ENCOUNTER — Inpatient Hospital Stay: Attending: Psychiatry | Admitting: Psychiatry

## 2024-01-18 VITALS — BP 127/66 | HR 77 | Temp 97.7°F | Resp 17 | Ht 63.0 in | Wt 132.6 lb

## 2024-01-18 DIAGNOSIS — Z90722 Acquired absence of ovaries, bilateral: Secondary | ICD-10-CM

## 2024-01-18 DIAGNOSIS — D270 Benign neoplasm of right ovary: Secondary | ICD-10-CM

## 2024-01-18 DIAGNOSIS — Z9079 Acquired absence of other genital organ(s): Secondary | ICD-10-CM

## 2024-01-18 DIAGNOSIS — Z90710 Acquired absence of both cervix and uterus: Secondary | ICD-10-CM

## 2024-01-18 DIAGNOSIS — D271 Benign neoplasm of left ovary: Secondary | ICD-10-CM

## 2024-01-18 DIAGNOSIS — R1900 Intra-abdominal and pelvic swelling, mass and lump, unspecified site: Secondary | ICD-10-CM

## 2024-01-18 NOTE — Patient Instructions (Addendum)
 It was a pleasure to see you in clinic today. - Healing well from surgery - Restrictions lifted. Reintroduce normal activities gradually - Okay to return to routine care.  Thank you very much for allowing me to provide care for you today.  I appreciate your confidence in choosing our Gynecologic Oncology team at [facility].  If you have any questions about your visit today please call our office or send us  a MyChart message and we will get back to you as soon as possible.   [facility]: phone [phone] /  fax [phone]  -   [name], [place], [place] [address]

## 2024-01-18 NOTE — Progress Notes (Signed)
 Gynecologic Oncology Return Clinic Visit  Date of Service: 01/18/2024 Referring Provider: Thora Flint, MD   Assessment & Plan: Victoria [name] is a 63 y.o. woman with an adnexal cystic mass who is s/p RA-BSO on 01/05/24, benign final pathology.  Postop: - Pt recovering well from surgery and healing appropriately postoperatively - Intraoperative findings and pathology results reviewed. - Ongoing postoperative expectations reviewed. Restrictions lifted. - Given that uterus is in situ, pt advised that she should continue with pap smear screening per routine until age 53 if she continues with negative/low grade paps.  - Okay to return to routine care.  RTC prn.  Derrel Flies, MD Gynecologic Oncology     ----------------------- Reason for Visit: Postop   Interval History: Pt reports that she is recovering well from surgery. She is using occasional tylenol  for pain. She is eating and drinking well. She is voiding without issue and having regular bowel movements. Still taking senna.    Past Medical/Surgical History: Past Medical History:  Diagnosis Date   Anxiety    Arthritis    Asthma    Diverticulosis    GERD (gastroesophageal reflux disease)    Headache    Hypertension    IBS (irritable bowel syndrome)    Osteoporosis    Palpitations    PSVT (paroxysmal supraventricular tachycardia) (HCC)    Scoliosis    Vertigo     Past Surgical History:  Procedure Laterality Date   abdominal hernia     ? umbilical   COLONOSCOPY  01/29/2021   ROBOTIC ASSISTED BILATERAL SALPINGO OOPHERECTOMY Bilateral 01/05/2024   Procedure: SALPINGO-OOPHORECTOMY, BILATERAL, ROBOT-ASSISTED;  Surgeon: Derrel Flies, MD;  Location: [facility];  Service: Gynecology;  Laterality: Bilateral;  POSSIBLE STAGING   SHOULDER ARTHROSCOPY Right    TONSILLECTOMY     VEIN SURGERY Bilateral 1994    Family History  Problem Relation Age of Onset   Urticaria Mother    Allergic rhinitis Mother     Hypertension Mother    GER disease Mother    Hiatal hernia Mother    Gallbladder disease Mother    Heart attack Mother    Heart disease Mother    Allergic rhinitis Father    Urticaria Father    Heart attack Father    Stroke Father    Scoliosis Father    Heart disease Father    Cancer Maternal Grandmother        type unknown   Heart attack Maternal Grandfather    Rectal cancer Paternal Grandmother    Heart attack Paternal Grandfather    Asthma Daughter    Allergies Daughter    Asthma Son    Allergies Son    Breast cancer Neg Hx    Ovarian cancer Neg Hx    Uterine cancer Neg Hx    Colon cancer Neg Hx    Esophageal cancer Neg Hx    Stomach cancer Neg Hx     Social History   Socioeconomic History   Marital status: Married    Spouse name: Not on file   Number of children: 2   Years of education: Not on file   Highest education level: Not on file  Occupational History   Occupation: retired  Tobacco Use   Smoking status: Never    Passive exposure: Past   Smokeless tobacco: Never  Vaping Use   Vaping status: Never Used  Substance and Sexual Activity   Alcohol use: Yes    Comment: rare   Drug use: No  Sexual activity: Yes    Partners: Male  Other Topics Concern   Not on file  Social History Narrative   Not on file   Social Drivers of Health   Financial Resource Strain: Not on file  Food Insecurity: No Food Insecurity (11/27/2023)   Hunger Vital Sign    Worried About Running Out of Food in the Last Year: Never true    Ran Out of Food in the Last Year: Never true  Transportation Needs: No Transportation Needs (11/27/2023)   PRAPARE - Administrator, Civil Service (Medical): No    Lack of Transportation (Non-Medical): No  Physical Activity: Not on file  Stress: Not on file  Social Connections: Not on file    Current Medications:  Current Outpatient Medications:    acetaminophen  (TYLENOL ) 650 MG CR tablet, Take 650 mg by mouth every 8 (eight)  hours as needed for pain., Disp: , Rfl:    ALPRAZolam  (XANAX ) 0.5 MG tablet, Take 0.25 mg by mouth at bedtime as needed., Disp: , Rfl:    Calcium Carb-Cholecalciferol (CALCIUM + D3 PO), Take 1 capsule by mouth daily with lunch., Disp: , Rfl:    Calcium Citrate-Vitamin D  (CALCIUM CITRATE + D PO), Take 1 tablet by mouth at bedtime., Disp: , Rfl:    calcium elemental as carbonate (TUMS ULTRA 1000) 400 MG chewable tablet, Chew 1,000 mg by mouth daily as needed for heartburn., Disp: , Rfl:    Cranberry-Vitamin C-Probiotic (AZO CRANBERRY) 250-30 MG TABS, Take 2 tablets by mouth at bedtime., Disp: , Rfl:    cyclobenzaprine  (FLEXERIL ) 10 MG tablet, Take 1 tablet (10 mg total) by mouth 3 (three) times daily as needed for muscle spasms. (Patient taking differently: Take 5-10 mg by mouth 3 (three) times daily as needed for muscle spasms.), Disp: 270 tablet, Rfl: 1   DEXILANT  60 MG capsule, TAKE 1 CAPSULE BY MOUTH EVERY DAY, Disp: 90 capsule, Rfl: 0   dicyclomine  (BENTYL ) 20 MG tablet, Take 1 tablet (20 mg total) by mouth 3 (three) times daily before meals. (Patient taking differently: Take 20 mg by mouth 3 (three) times daily as needed for spasms.), Disp: 90 tablet, Rfl: 11   diltiazem  (CARDIZEM ) 30 MG tablet, Take 30 mg by mouth 2 (two) times daily., Disp: , Rfl:    diphenhydrAMINE (BENADRYL) 50 MG tablet, Take 50 mg by mouth at bedtime as needed for sleep., Disp: , Rfl:    fluticasone  (FLONASE ) 50 MCG/ACT nasal spray, Place 2 sprays into both nostrils daily. (Patient taking differently: Place 1 spray into both nostrils daily.), Disp: 16 g, Rfl: 3   Ginger, Zingiber officinalis, (GINGER PO), Take 1 tablet by mouth daily as needed (upset stomach)., Disp: , Rfl:    Lactase (LACTAID FAST ACT) 9000 units CHEW, Chew 2 tablets by mouth daily as needed (consuming dair)., Disp: , Rfl:    Magnesium 200 MG TABS, Take 200 mg by mouth at bedtime., Disp: , Rfl:    metroNIDAZOLE  (METROGEL ) 1 % gel, APPLY EXTERNALLY TO FACE  EVERY NIGHT AT qhs prn. Needs appt for more refills. (Patient taking differently: Apply 1 Application topically 2 (two) times daily.), Disp: 55 g, Rfl: 0   Multiple Vitamin (MULTIVITAMIN) tablet, Take 1 tablet by mouth daily., Disp: , Rfl:    OVER THE COUNTER MEDICATION, Take 1 tablet by mouth daily. Ultra Flora Balance Probiotic, Disp: , Rfl:    polyethylene glycol (MIRALAX / GLYCOLAX) packet, Take 5.1 g by mouth daily., Disp: , Rfl:  pramoxine-hydrocortisone  (PROCTOCREAM-HC) 1-1 % rectal cream, Place 1 Application rectally 2 (two) times daily., Disp: 30 g, Rfl: 1   PREVIDENT 5000 SENSITIVE 1.1-5 % PSTE, Place 1 Application onto teeth at bedtime., Disp: , Rfl: 5   Psyllium (METAMUCIL PO), Take 1 Dose by mouth at bedtime. 1 dose = 1 tablespoon, Disp: , Rfl:    senna-docusate (SENOKOT-S) 8.6-50 MG tablet, Take 2 tablets by mouth at bedtime. For AFTER surgery, do not take if having diarrhea, Disp: 30 tablet, Rfl: 0   simethicone (MYLICON) 125 MG chewable tablet, Chew 125-375 mg by mouth 3 (three) times daily as needed for flatulence., Disp: , Rfl:    traMADol  (ULTRAM ) 50 MG tablet, Take 1 tablet (50 mg total) by mouth every 6 (six) hours as needed for severe pain (pain score 7-10). For AFTER surgery only, do not take and drive, Disp: 10 tablet, Rfl: 0  Review of Symptoms: Complete 10-system review is negative except as above in Interval History.  Physical Exam: BP 127/66 (BP Location: Left Arm, Patient Position: Sitting)   Pulse 77   Temp 97.7 F (36.5 C) (Oral)   Resp 17   Ht 5\' 3"  (1.6 m)   Wt 132 lb 9.6 oz (60.1 kg)   SpO2 100%   BMI 23.49 kg/m  General: Alert, oriented, no acute distress. HEENT: Normocephalic, atraumatic. Neck symmetric without masses. Sclera anicteric.  Chest: Normal work of breathing. Abdomen: Soft, nontender.  Normoactive bowel sounds.  No masses appreciated.  Well-healing incisions with glue and some old bruising. Extremities: Grossly normal range of motion.   Warm, well perfused.  No edema bilaterally. Skin: No rashes or lesions noted.  Laboratory & Radiologic Studies: Surgical pathology (01/05/24):  A. FALLOPIAN TUBE, OVARY, LEFT, SALPINGO-OOPHORECTOMY:  Benign ovarian serous cystadenoma.  Negative for borderline change and malignancy.  Benign fallopian tube with benign paratubal cyst.   B. FALLOPIAN TUBE, OVARY, RIGHT, SALPINGO-OOPHORECTOMY:  Benign ovarian serous cystadenoma.  Negative for borderline change or malignancy.  Benign fallopian tube with benign paratubal cyst.   Cytology (01/05/24): FINAL MICROSCOPIC DIAGNOSIS:  - No malignant cells identified  - Reactive mesothelial cells present

## 2024-01-19 ENCOUNTER — Telehealth: Payer: Self-pay

## 2024-01-19 ENCOUNTER — Other Ambulatory Visit: Payer: Self-pay

## 2024-01-19 MED ORDER — RIFAXIMIN 550 MG PO TABS: 550.0000 mg | ORAL_TABLET | Freq: Three times a day (TID) | ORAL | 0 refills | Status: DC

## 2024-01-19 NOTE — Telephone Encounter (Signed)
 Please send rifaximin 550 mg 3 times daily x 14 days See what this cost the patient and if not we can look for samples JMP

## 2024-01-19 NOTE — Telephone Encounter (Signed)
 Script sent in for xifaxan, will need PA

## 2024-01-20 ENCOUNTER — Telehealth: Payer: Self-pay

## 2024-01-20 ENCOUNTER — Other Ambulatory Visit (HOSPITAL_COMMUNITY): Payer: Self-pay

## 2024-01-20 NOTE — Telephone Encounter (Signed)
 Pharmacy Patient Advocate Encounter  Received notification from [facility] that Prior Authorization for Xifaxan 550MG  tablets has been APPROVED from 01-20-2024 to 01-19-2025   PA #/Case ID/Reference #: [id number]

## 2024-01-20 NOTE — Telephone Encounter (Signed)
 Pharmacy Patient Advocate Encounter   Received notification from Pt Calls Messages that prior authorization for Xifaxan 550MG  tablets is required/requested.   Insurance verification completed.   The patient is insured through [facility] .   Per test claim: PA required; PA submitted to above mentioned insurance via CoverMyMeds Key/confirmation #/EOC [facility] Status is pending

## 2024-01-20 NOTE — Telephone Encounter (Signed)
.  PA request has been Submitted. New Encounter has been or will be created for follow up. For additional info see Pharmacy Prior Auth telephone encounter from 01-20-2024.

## 2024-02-01 ENCOUNTER — Other Ambulatory Visit: Payer: Self-pay | Admitting: Internal Medicine

## 2024-02-05 ENCOUNTER — Ambulatory Visit (HOSPITAL_COMMUNITY)
Admission: RE | Admit: 2024-02-05 | Discharge: 2024-02-05 | Disposition: A | Discharge status: Home / Self Care | Source: Ambulatory Visit | Attending: Gynecologic Oncology | Admitting: Gynecologic Oncology

## 2024-02-05 ENCOUNTER — Inpatient Hospital Stay (HOSPITAL_BASED_OUTPATIENT_CLINIC_OR_DEPARTMENT_OTHER): Admitting: Gynecologic Oncology

## 2024-02-05 ENCOUNTER — Telehealth: Payer: Self-pay | Admitting: *Deleted

## 2024-02-05 ENCOUNTER — Telehealth: Payer: Self-pay | Admitting: Gynecologic Oncology

## 2024-02-05 ENCOUNTER — Ambulatory Visit (HOSPITAL_COMMUNITY)

## 2024-02-05 VITALS — BP 138/72 | HR 89 | Temp 97.6°F | Resp 16 | Ht 63.0 in | Wt 133.4 lb

## 2024-02-05 DIAGNOSIS — K5730 Diverticulosis of large intestine without perforation or abscess without bleeding: Secondary | ICD-10-CM | POA: Diagnosis not present

## 2024-02-05 DIAGNOSIS — R1012 Left upper quadrant pain: Secondary | ICD-10-CM | POA: Diagnosis not present

## 2024-02-05 DIAGNOSIS — D270 Benign neoplasm of right ovary: Secondary | ICD-10-CM

## 2024-02-05 DIAGNOSIS — R1084 Generalized abdominal pain: Secondary | ICD-10-CM | POA: Diagnosis not present

## 2024-02-05 DIAGNOSIS — R1900 Intra-abdominal and pelvic swelling, mass and lump, unspecified site: Secondary | ICD-10-CM

## 2024-02-05 DIAGNOSIS — D271 Benign neoplasm of left ovary: Secondary | ICD-10-CM

## 2024-02-05 DIAGNOSIS — Z90722 Acquired absence of ovaries, bilateral: Secondary | ICD-10-CM

## 2024-02-05 DIAGNOSIS — G8918 Other acute postprocedural pain: Secondary | ICD-10-CM

## 2024-02-05 MED ORDER — CYCLOBENZAPRINE HCL 10 MG PO TABS: 10.0000 mg | ORAL_TABLET | Freq: Two times a day (BID) | ORAL | 0 refills | Status: DC | PRN

## 2024-02-05 MED ORDER — HYDROCODONE-ACETAMINOPHEN 5-325 MG PO TABS: 1.0000 | ORAL_TABLET | Freq: Four times a day (QID) | ORAL | 0 refills | Status: DC | PRN

## 2024-02-05 MED ORDER — IBUPROFEN 800 MG PO TABS: 800.0000 mg | ORAL_TABLET | Freq: Three times a day (TID) | ORAL | 0 refills | Status: DC | PRN

## 2024-02-05 MED ORDER — LIDOCAINE 5 % EX PTCH
1.0000 | MEDICATED_PATCH | CUTANEOUS | 0 refills | Status: DC
Start: 2024-02-05 — End: 2024-07-26

## 2024-02-05 NOTE — Patient Instructions (Signed)
 Plan to have the CT scan this evening. Nothing to eat or drink until you have this done.  We have sent in prescriptions for topical lidocaine  patches you can place in the left upper quadrant, flexeril  for muscle spasms as needed, hydrocodone /APAP for pain as needed (this has tylenol  so monitor your intake of tylenol ), and prescription strength ibuprofen to use every 8 hours as needed for pain and take with food.  If you start having worsening symptoms, we recommend further eval in the ER.

## 2024-02-05 NOTE — Progress Notes (Unsigned)
 GYN Oncology Symptom Management  HPI: Victoria [name] is a 63 year old female initially seen in consultation on 11/30/2023 for an ovarian cyst. She is s/p robotic assisted BSO on 01/05/24 with Dr. Daisey Dryer. Final pathology with bilateral benign ovarian serous cystadenomas with no borderline or malignant changes.   Interval: On Tuesday of this week, she climbed steep stairs at her daughter's house and started having prickle-like discomfort in her abdomen. The pain in the left upper quadrant has worsened over the past several days. Her pain this am was a 15 out of 10 on the pain scale. Nothing has seemed to relieve the pain. By applying pressure over the area, this decreases the discomfort slightly. No nausea or emesis. Overall tolerating diet. Bladder functioning without difficulty. Having bowel movements and uses miralax daily. Has to slightly strain when first having BM. No lower extremity issues. No vaginal bleeding or discharge. No fever or chills. Has hx of umbilical hernia repair as a child, unsure if mesh was placed.   Exam: Alert, oriented, appears to be uncomfortable related to pain, grimacing intermittently and unable to sit due to pain Lungs clear. Heart regular in rate and rhythm. Abdomen is soft with active bowel sounds. Mild edema noted around the left upper quadrant incision. Resolving ecchymosis noted around the incision at the umbilicus. No palpable mass or bulge with palpation of the LUQ incision and with coughing. Tender to palpation. Incisions are healing well without erythema or drainage. No lower extremity edema  Assessment/Plan: 63 year old female s/p robotic assisted BSO on 01/05/24 presenting to the office with severe left upper quadrant pain.  -Binder placed on patient with some relief reported. -Current situation discussed with Dr. Orvil Bland with plan for a CT scan to further evaluate.  -Prescriptions sent for symptom management and discussed with patient. -Reportable signs and  symptoms reviewed. She is advised to contact the office for any needs.

## 2024-02-05 NOTE — Telephone Encounter (Signed)
 Called the patient with her CT scan results.  No evidence of hernia, seroma, or hematoma.  No acute intra-abdominal process.  Patient very relieved.  Discussed continued postoperative expectations and care.  Wiley Hanger MD Gynecologic Oncology

## 2024-02-05 NOTE — Telephone Encounter (Signed)
 Spoke with Ms. Risdon who called the office stating she is having a dull to sharp pain in her abdomen starting at the upper left incision below her breast that radiates to the incision just above her umbilicus.   Pt denies fever, chills, pain on urination, frequency, and no vaginal bleeding. She reports having regular bowel movements, but has to slightly strain. She is not taking senokot but is taking daily miralax. Pt denies nausea and or vomiting.   Patient states that she has been following all activity restrictions but reports using the stairs at her home without difficulty and she had been doing very well. But on Tuesday this week she reports going up a steep flight of stairs at her daughter's house and then felt "pin prickle" pains in her abdomen. Pt reports it now feels like there is a "knife" in her abdomen.   Pt has tried using heat, ice and is taking advil and tylenol  without relief. Advised patient her message would be relayed to providers and the office will call back with recommendations.

## 2024-02-05 NOTE — Telephone Encounter (Signed)
 Spoke with Ms. Kissler and relayed advice from Vira Grieves, NP that a Rx can be sent in for a muscle relaxer or tramadol . Pt reports trying flexeril  that she has had a home for her back and it hasn't helped. She is also using ice on the area that has helped a little and she reports heat has made it feel worse. Pt was offered an appointment today with NP. Pt agreed and scheduled this afternoon at 3 pm.

## 2024-02-08 ENCOUNTER — Telehealth: Payer: Self-pay

## 2024-02-08 NOTE — Telephone Encounter (Signed)
 Office received a PA from [employer] on Lodocaine patch prescribed by Vira Grieves NP.  Requested information provided and sent to insurance plan

## 2024-02-09 NOTE — Telephone Encounter (Signed)
 PA for Lidocaine  patch denied by insurance. Per pharmacist out of pocket price, with on file discount card, is $36.47. Pt is aware and will pick up from pharmacy this week.   Victoria [name] wanted me to pass on the message to Victoria Grieves NP, pt reports she is feeling much better, she only uses the binder when she feels like she has done to much activity and feels the "pulling sensation". She is very thankful for our office for going above and beyond for her on a very stressful day.

## 2024-02-11 DIAGNOSIS — G8918 Other acute postprocedural pain: Secondary | ICD-10-CM | POA: Insufficient documentation

## 2024-02-15 ENCOUNTER — Ambulatory Visit: Admitting: Internal Medicine

## 2024-02-16 ENCOUNTER — Ambulatory Visit: Admitting: Nurse Practitioner

## 2024-02-17 ENCOUNTER — Encounter: Payer: Self-pay | Admitting: Physician Assistant

## 2024-02-17 ENCOUNTER — Ambulatory Visit (INDEPENDENT_AMBULATORY_CARE_PROVIDER_SITE_OTHER): Admitting: Physician Assistant

## 2024-02-17 VITALS — BP 120/77 | HR 98 | Temp 98.6°F | Ht 63.0 in | Wt 133.0 lb

## 2024-02-17 DIAGNOSIS — L719 Rosacea, unspecified: Secondary | ICD-10-CM

## 2024-02-17 DIAGNOSIS — K219 Gastro-esophageal reflux disease without esophagitis: Secondary | ICD-10-CM | POA: Diagnosis not present

## 2024-02-17 DIAGNOSIS — M51369 Other intervertebral disc degeneration, lumbar region without mention of lumbar back pain or lower extremity pain: Secondary | ICD-10-CM | POA: Insufficient documentation

## 2024-02-17 DIAGNOSIS — Z7689 Persons encountering health services in other specified circumstances: Secondary | ICD-10-CM

## 2024-02-17 DIAGNOSIS — K638219 Small intestinal bacterial overgrowth, unspecified: Secondary | ICD-10-CM

## 2024-02-17 DIAGNOSIS — I471 Supraventricular tachycardia, unspecified: Secondary | ICD-10-CM | POA: Insufficient documentation

## 2024-02-17 DIAGNOSIS — F411 Generalized anxiety disorder: Secondary | ICD-10-CM

## 2024-02-17 DIAGNOSIS — J45909 Unspecified asthma, uncomplicated: Secondary | ICD-10-CM

## 2024-02-17 DIAGNOSIS — M5136 Other intervertebral disc degeneration, lumbar region with discogenic back pain only: Secondary | ICD-10-CM

## 2024-02-17 MED ORDER — ALPRAZOLAM 0.5 MG PO TABS: 0.2500 mg | ORAL_TABLET | Freq: Every evening | ORAL | 0 refills | Status: DC | PRN

## 2024-02-17 MED ORDER — CYCLOBENZAPRINE HCL 10 MG PO TABS: 10.0000 mg | ORAL_TABLET | Freq: Two times a day (BID) | ORAL | 3 refills | Status: DC | PRN

## 2024-02-17 MED ORDER — METRONIDAZOLE 1 % EX GEL: 1.0000 | Freq: Every day | CUTANEOUS | 3 refills | Status: DC

## 2024-02-17 MED ORDER — LEVALBUTEROL TARTRATE 45 MCG/ACT IN AERO: 1.0000 | INHALATION_SPRAY | RESPIRATORY_TRACT | 12 refills | Status: DC | PRN

## 2024-02-17 MED ORDER — RIFAXIMIN 550 MG PO TABS
550.0000 mg | ORAL_TABLET | Freq: Three times a day (TID) | ORAL | 3 refills | Status: DC
Start: 2024-02-17 — End: 2024-07-26

## 2024-02-17 NOTE — Addendum Note (Signed)
 Addended by: Marco Severs on: 02/17/2024 04:05 PM   Modules accepted: Level of Service

## 2024-02-17 NOTE — Assessment & Plan Note (Signed)
 Stable with metro gel. Medication refill today. No changes in management.

## 2024-02-17 NOTE — Progress Notes (Signed)
 New Patient Office Visit  Subjective    Patient ID: Victoria [name], female    DOB: 24-Dec-1960  Age: 63 y.o. MRN: [id number]  CC:  Chief Complaint  Patient presents with   Establish Care    Surgery 6 weeks ago gyn  Refills 90 days if applicable    HPI Victoria [name] presents to establish care  Patient presents today with past medical history significant for asthma, GERD, SIBO, hypertension, lumbar degenerative changes and stenosis, vertigo. IBS, PSVT, rosacea, allergies, and recent ovarian cyst removal. She follows regularly with OBGYN for annual wellness and sees GI, allergy , and cardiology. She reports compliance with medication and requests refills today. She does not have further complaints other than establishing care.   Recent hospital records, lab results, and procedure notes reviewed today.   Outpatient Encounter Medications as of 02/17/2024  Medication Sig   acetaminophen  (TYLENOL ) 650 MG CR tablet Take 650 mg by mouth every 8 (eight) hours as needed for pain.   Calcium Carb-Cholecalciferol (CALCIUM + D3 PO) Take 1 capsule by mouth daily with lunch.   Calcium Citrate-Vitamin D  (CALCIUM CITRATE + D PO) Take 1 tablet by mouth at bedtime.   calcium elemental as carbonate (TUMS ULTRA 1000) 400 MG chewable tablet Chew 1,000 mg by mouth daily as needed for heartburn.   Cranberry-Vitamin C-Probiotic (AZO CRANBERRY) 250-30 MG TABS Take 2 tablets by mouth at bedtime.   DEXILANT  60 MG capsule TAKE 1 CAPSULE BY MOUTH EVERY DAY   dicyclomine  (BENTYL ) 20 MG tablet Take 1 tablet (20 mg total) by mouth 3 (three) times daily before meals. (Patient taking differently: Take 20 mg by mouth 3 (three) times daily as needed for spasms.)   diltiazem  (CARDIZEM ) 30 MG tablet Take 30 mg by mouth 2 (two) times daily.   diphenhydrAMINE (BENADRYL) 50 MG tablet Take 50 mg by mouth at bedtime as needed for sleep.   fluticasone  (FLONASE ) 50 MCG/ACT nasal spray Place 2 sprays into both nostrils daily. (Patient  taking differently: Place 1 spray into both nostrils daily.)   Ginger, Zingiber officinalis, (GINGER PO) Take 1 tablet by mouth daily as needed (upset stomach).   ibuprofen  (ADVIL ) 800 MG tablet Take 1 tablet (800 mg total) by mouth every 8 (eight) hours as needed for moderate pain (pain score 4-6). Take with food   Lactase (LACTAID FAST ACT) 9000 units CHEW Chew 2 tablets by mouth daily as needed (consuming dair).   levalbuterol  (XOPENEX  HFA) 45 MCG/ACT inhaler Inhale 1-2 puffs into the lungs every 4 (four) hours as needed for wheezing.   lidocaine  (LIDODERM ) 5 % Place 1 patch onto the skin daily. Apply to left upper abdomen. Remove & Discard patch within 12 hours or as directed by MD   Magnesium 200 MG TABS Take 200 mg by mouth at bedtime.   Multiple Vitamin (MULTIVITAMIN) tablet Take 1 tablet by mouth daily.   OVER THE COUNTER MEDICATION Take 1 tablet by mouth daily. Ultra Flora Balance Probiotic   polyethylene glycol (MIRALAX / GLYCOLAX) packet Take 5.1 g by mouth daily.   pramoxine-hydrocortisone  (PROCTOCREAM-HC) 1-1 % rectal cream Place 1 Application rectally 2 (two) times daily.   PREVIDENT 5000 SENSITIVE 1.1-5 % PSTE Place 1 Application onto teeth at bedtime.   Psyllium (METAMUCIL PO) Take 1 Dose by mouth at bedtime. 1 dose = 1 tablespoon   senna-docusate (SENOKOT-S) 8.6-50 MG tablet Take 2 tablets by mouth at bedtime. For AFTER surgery, do not take if having diarrhea   simethicone (  MYLICON) 125 MG chewable tablet Chew 125-375 mg by mouth 3 (three) times daily as needed for flatulence.   [DISCONTINUED] ALPRAZolam  (XANAX ) 0.5 MG tablet Take 0.25 mg by mouth at bedtime as needed.   [DISCONTINUED] cyclobenzaprine  (FLEXERIL ) 10 MG tablet Take 1 tablet (10 mg total) by mouth 2 (two) times daily as needed for muscle spasms. Do not take and drive, do not take with hydrocodone    [DISCONTINUED] HYDROcodone -acetaminophen  (NORCO/VICODIN) 5-325 MG tablet Take 1 tablet by mouth every 6 (six) hours as  needed for moderate pain (pain score 4-6). Do not take and drive   [DISCONTINUED] metroNIDAZOLE  (METROGEL ) 1 % gel APPLY EXTERNALLY TO FACE EVERY NIGHT AT qhs prn. Needs appt for more refills. (Patient taking differently: Apply 1 Application topically 2 (two) times daily.)   [DISCONTINUED] rifaximin  (XIFAXAN ) 550 MG TABS tablet Take 1 tablet (550 mg total) by mouth 3 (three) times daily.   ALPRAZolam  (XANAX ) 0.5 MG tablet Take 0.5 tablets (0.25 mg total) by mouth at bedtime as needed.   cyclobenzaprine  (FLEXERIL ) 10 MG tablet Take 1 tablet (10 mg total) by mouth 2 (two) times daily as needed for muscle spasms. Do not take and drive, do not take with hydrocodone    metroNIDAZOLE  (METROGEL ) 1 % gel Apply 1 Application topically daily.   rifaximin  (XIFAXAN ) 550 MG TABS tablet Take 1 tablet (550 mg total) by mouth 3 (three) times daily.   No facility-administered encounter medications on file as of 02/17/2024.    Past Medical History:  Diagnosis Date   Anxiety    Arthritis    Asthma    Diverticulosis    GERD (gastroesophageal reflux disease)    Headache    Hypertension    IBS (irritable bowel syndrome)    Osteoporosis    Palpitations    PSVT (paroxysmal supraventricular tachycardia) (HCC)    Scoliosis    Vertigo     Past Surgical History:  Procedure Laterality Date   abdominal hernia     ? umbilical   COLONOSCOPY  01/29/2021   ROBOTIC ASSISTED BILATERAL SALPINGO OOPHERECTOMY Bilateral 01/05/2024   Procedure: SALPINGO-OOPHORECTOMY, BILATERAL, ROBOT-ASSISTED;  Surgeon: Derrel Flies, MD;  Location: [facility];  Service: Gynecology;  Laterality: Bilateral;  POSSIBLE STAGING   SHOULDER ARTHROSCOPY Right    TONSILLECTOMY     VEIN SURGERY Bilateral 1994    Family History  Problem Relation Age of Onset   Urticaria Mother    Allergic rhinitis Mother    Hypertension Mother    GER disease Mother    Hiatal hernia Mother    Gallbladder disease Mother    Heart attack Mother    Heart  disease Mother    Allergic rhinitis Father    Urticaria Father    Heart attack Father    Stroke Father    Scoliosis Father    Heart disease Father    Cancer Maternal Grandmother        type unknown   Heart attack Maternal Grandfather    Rectal cancer Paternal Grandmother    Heart attack Paternal Grandfather    Asthma Daughter    Allergies Daughter    Asthma Son    Allergies Son    Breast cancer Neg Hx    Ovarian cancer Neg Hx    Uterine cancer Neg Hx    Colon cancer Neg Hx    Esophageal cancer Neg Hx    Stomach cancer Neg Hx     Social History   Socioeconomic History   Marital status: Married  Spouse name: Not on file   Number of children: 2   Years of education: Not on file   Highest education level: Associate degree: academic program  Occupational History   Occupation: retired  Tobacco Use   Smoking status: Never    Passive exposure: Past   Smokeless tobacco: Never  Vaping Use   Vaping status: Never Used  Substance and Sexual Activity   Alcohol use: Yes    Comment: rare   Drug use: No   Sexual activity: Yes    Partners: Male  Other Topics Concern   Not on file  Social History Narrative   Not on file   Social Drivers of Health   Financial Resource Strain: Low Risk  (02/13/2024)   Overall Financial Resource Strain (CARDIA)    Difficulty of Paying Living Expenses: Not hard at all  Food Insecurity: No Food Insecurity (02/13/2024)   Hunger Vital Sign    Worried About Running Out of Food in the Last Year: Never true    Ran Out of Food in the Last Year: Never true  Transportation Needs: No Transportation Needs (02/13/2024)   PRAPARE - Administrator, Civil Service (Medical): No    Lack of Transportation (Non-Medical): No  Physical Activity: Sufficiently Active (02/13/2024)   Exercise Vital Sign    Days of Exercise per Week: 7 days    Minutes of Exercise per Session: 60 min  Stress: Patient Declined (02/13/2024)   [employer] of Occupational  Health - Occupational Stress Questionnaire    Feeling of Stress : Patient declined  Social Connections: Unknown (02/13/2024)   Social Connection and Isolation Panel [NHANES]    Frequency of Communication with Friends and Family: More than three times a week    Frequency of Social Gatherings with Friends and Family: Patient declined    Attends Religious Services: Patient declined    Database administrator or Organizations: Patient declined    Attends Banker Meetings: Not on file    Marital Status: Married  Intimate Partner Violence: Not At Risk (11/27/2023)   Humiliation, Afraid, Rape, and Kick questionnaire    Fear of Current or Ex-Partner: No    Emotionally Abused: No    Physically Abused: No    Sexually Abused: No    Review of Systems  Constitutional:  Negative for fever, malaise/fatigue and weight loss.  Respiratory:  Negative for cough and shortness of breath.   Cardiovascular:  Negative for chest pain and palpitations.  Gastrointestinal:  Positive for abdominal pain and heartburn. Negative for blood in stool and melena.  Musculoskeletal:  Positive for back pain and joint pain. Negative for neck pain.  Neurological:  Negative for dizziness, sensory change, focal weakness and headaches.  Endo/Heme/Allergies:  Positive for environmental allergies.  Psychiatric/Behavioral:  Negative for depression. The patient is not nervous/anxious.         Objective    BP 120/77   Pulse 98   Temp 98.6 F (37 C)   Ht 5' 3 (1.6 m)   Wt 133 lb (60.3 kg)   SpO2 99%   BMI 23.56 kg/m   Physical Exam Constitutional:      Appearance: Normal appearance. She is normal weight.  HENT:     Head: Normocephalic.     Mouth/Throat:     Mouth: Mucous membranes are moist.     Pharynx: Oropharynx is clear.  Eyes:     Extraocular Movements: Extraocular movements intact.     Conjunctiva/sclera: Conjunctivae normal.  Cardiovascular:     Rate and Rhythm: Normal rate and regular rhythm.      Heart sounds: Normal heart sounds. No murmur heard. Pulmonary:     Effort: Pulmonary effort is normal.     Breath sounds: Normal breath sounds. No wheezing, rhonchi or rales.  Musculoskeletal:     Right lower leg: No edema.     Left lower leg: No edema.  Skin:    General: Skin is warm and dry.  Neurological:     General: No focal deficit present.     Mental Status: She is alert and oriented to person, place, and time.  Psychiatric:        Mood and Affect: Mood normal.        Behavior: Behavior normal.       Assessment & Plan:  Encounter to establish care  Small intestinal bacterial overgrowth (SIBO) Assessment & Plan: Patient has recently seen and been evaluated by GI for abdominal pain and bloating. At the time was given rifaximin  samples for SIBO. Refills today as she has run out of her 1st 30 days. Patient advised to follow up with GI for continued concerns related to SIBO symptoms or if medication is not covered.   Orders: -     rifAXIMin ; Take 1 tablet (550 mg total) by mouth 3 (three) times daily.  Dispense: 270 tablet; Refill: 3  Acne rosacea Assessment & Plan: Stable with metro gel. Medication refill today. No changes in management.   Orders: -     metroNIDAZOLE ; Apply 1 Application topically daily.  Dispense: 55 g; Refill: 3  Gastroesophageal reflux disease without esophagitis Assessment & Plan: Patient presents today with history of GERD without esophagitis. Stable on Dexilant , no changes in management today.    Generalized anxiety disorder Assessment & Plan: Patient presents today with anxiety and associated panic attacks with vertigo. She reports infrequent use on xanax  for symptoms. Medication refilled today. Patient advised minimal use, and is aware of sedative and addictive nature of medication.   Orders: -     ALPRAZolam ; Take 0.5 tablets (0.25 mg total) by mouth at bedtime as needed.  Dispense: 30 tablet; Refill: 0  Degeneration of intervertebral  disc of lumbar region with discogenic back pain Assessment & Plan: Patient presents today with long history of back pain secondary to degenerative disc disease and stenosis. Moderate improvement with NSAIDs. Flexeril  refilled today for spasms related to back pain.   Orders: -     Cyclobenzaprine  HCl; Take 1 tablet (10 mg total) by mouth 2 (two) times daily as needed for muscle spasms. Do not take and drive, do not take with hydrocodone   Dispense: 30 tablet; Refill: 3  Uncomplicated asthma, unspecified asthma severity, unspecified whether persistent Assessment & Plan: Symptoms currently stable. Patient with infrequent use of rescue inhaler however requesting refill as previous inhaler is out of date. Lungs clear to auscultation bilaterally.   Orders: -     Levalbuterol  Tartrate; Inhale 1-2 puffs into the lungs every 4 (four) hours as needed for wheezing.  Dispense: 1 each; Refill: 12    Return in about 1 year (around 02/16/2025).   Victoria Mince Servando Kyllonen, PA-C

## 2024-02-17 NOTE — Assessment & Plan Note (Signed)
 Symptoms currently stable. Patient with infrequent use of rescue inhaler however requesting refill as previous inhaler is out of date. Lungs clear to auscultation bilaterally.

## 2024-02-17 NOTE — Progress Notes (Signed)
   Subjective:    Patient ID: Victoria [name], female    DOB: Feb 06, 1961, 63 y.o.   MRN: [id number]  HPI    Review of Systems     Objective:   Physical Exam        Assessment & Plan:

## 2024-02-17 NOTE — Assessment & Plan Note (Signed)
 Patient has recently seen and been evaluated by GI for abdominal pain and bloating. At the time was given rifaximin  samples for SIBO. Refills today as she has run out of her 1st 30 days. Patient advised to follow up with GI for continued concerns related to SIBO symptoms or if medication is not covered.

## 2024-02-17 NOTE — Assessment & Plan Note (Signed)
 Patient presents today with anxiety and associated panic attacks with vertigo. She reports infrequent use on xanax  for symptoms. Medication refilled today. Patient advised minimal use, and is aware of sedative and addictive nature of medication.

## 2024-02-17 NOTE — Assessment & Plan Note (Signed)
 Patient presents today with history of GERD without esophagitis. Stable on Dexilant , no changes in management today.

## 2024-02-17 NOTE — Assessment & Plan Note (Signed)
 Patient presents today with long history of back pain secondary to degenerative disc disease and stenosis. Moderate improvement with NSAIDs. Flexeril  refilled today for spasms related to back pain.

## 2024-02-18 ENCOUNTER — Other Ambulatory Visit (HOSPITAL_COMMUNITY): Payer: Self-pay

## 2024-02-18 ENCOUNTER — Telehealth: Payer: Self-pay | Admitting: *Deleted

## 2024-02-18 ENCOUNTER — Telehealth: Payer: Self-pay | Admitting: Pharmacy Technician

## 2024-02-18 NOTE — Telephone Encounter (Signed)
 Copied from CRM [id number]. Topic: MyChart - Other >> Feb 18, 2024  1:52 PM Marissa P wrote: Reason for CRM: John m calling from [employer], medication  levalbuterol  (XOPENEX  HFA) 45 MCG/ACT inhaler was approved and will be faxing the approval letter to the office.

## 2024-02-18 NOTE — Telephone Encounter (Signed)
 Pharmacy Patient Advocate Encounter  Received notification from [facility] that Prior Authorization for Levalbuterol  Tartrate 45MCG/ACT aerosol has been APPROVED from 02/18/2024 to 02/17/2025. Ran test claim, Copay is $5.00. This test claim was processed through [facility]- copay amounts may vary at other pharmacies due to pharmacy/plan contracts, or as the patient moves through the different stages of their insurance plan.   PA #/Case ID/Reference #: [id number]

## 2024-02-18 NOTE — Telephone Encounter (Signed)
 Pharmacy Patient Advocate Encounter   Received notification from Onbase that prior authorization for Levalbuterol  Tartrate 45MCG/ACT aerosol is required/requested.   Insurance verification completed.   The patient is insured through [facility] .   Per test claim: PA required; PA submitted to above mentioned insurance via CoverMyMeds Key/confirmation #/EOC [id number] Status is pending

## 2024-03-23 ENCOUNTER — Ambulatory Visit: Admitting: Family Medicine

## 2024-04-26 ENCOUNTER — Other Ambulatory Visit (HOSPITAL_COMMUNITY): Payer: Self-pay

## 2024-05-05 ENCOUNTER — Other Ambulatory Visit: Payer: Self-pay

## 2024-05-05 ENCOUNTER — Encounter: Payer: Self-pay | Admitting: Internal Medicine

## 2024-05-05 MED ORDER — DEXILANT 60 MG PO CPDR: DELAYED_RELEASE_CAPSULE | ORAL | 0 refills | Status: DC

## 2024-05-10 ENCOUNTER — Telehealth: Payer: Self-pay

## 2024-05-10 ENCOUNTER — Other Ambulatory Visit (HOSPITAL_COMMUNITY): Payer: Self-pay

## 2024-05-10 NOTE — Telephone Encounter (Signed)
 Insurance is asking if the patient has tried the dexlansoprazole  before. If she has, I will need a medical reason why she cannot use it in order for the brand to get approved. If she is okay with the generic, then I can continue with the pa submission. Please advise.

## 2024-05-10 NOTE — Telephone Encounter (Signed)
 Pharmacy Patient Advocate Encounter   Received notification from Patient Advice Request messages that prior authorization for Dexilant  60MG  dr capsules is required/requested.   Insurance verification completed.   The patient is insured through [facility] .   Prior Authorization form/request asks a question that requires your assistance. Please see the question below and advise accordingly. The PA will not be submitted until the necessary information is received.   Messaged office in separate encounter

## 2024-05-11 NOTE — Telephone Encounter (Signed)
 Pharmacy Patient Advocate Encounter   Per test claim: PA required; PA submitted to above mentioned insurance via Latent Key/confirmation #/EOC [id number] Status is pending

## 2024-05-12 ENCOUNTER — Other Ambulatory Visit (HOSPITAL_COMMUNITY): Payer: Self-pay

## 2024-05-12 ENCOUNTER — Telehealth: Payer: Self-pay

## 2024-05-12 NOTE — Telephone Encounter (Signed)
 PA request has been Submitted. New Encounter has been or will be created for follow up. For additional info see Pharmacy Prior Auth telephone encounter from 05-11-2024.

## 2024-05-12 NOTE — Telephone Encounter (Signed)
 Pharmacy Patient Advocate Encounter   Received notification from Pt Calls Messages that prior authorization for Dexlansoprazole  60MG  dr capsules is required/requested.   Insurance verification completed.   The patient is insured through [facility] .   Per test claim: PA required; PA submitted to above mentioned insurance via Latent Key/confirmation #/EOC [id number] Status is pending

## 2024-05-12 NOTE — Telephone Encounter (Signed)
 Victoria [name] with [employer] called stating that patient has been denied for Dexilant  and is requesting a call back to discuss alternative medications for patient. Call back number [phone] opt [phone]. Please advise.

## 2024-05-12 NOTE — Telephone Encounter (Signed)
 Pharmacy Patient Advocate Encounter  Received notification from [facility] that Prior Authorization for Dexilant  60MG  dr capsules  has been DENIED.  Full denial letter will be uploaded to the media tab. See denial reason below.  The request for coverage of brand Dexilant  is denied.   This medication is not on the formulary and is covered for members when:  The member has tried an A-rated generic equivalent and had a life-threatening side effect requiring medical intervention that did not occur with the brand equivalent.  The member has tried pantoprazole  and did not work, or was not tolerated The member must try and fail (did not work), or be unable to take ALL formulary alternatives (due to interactions, side effects, etc.)  In this case, none of the above criteria are met, and other formulary alternatives are available for the member to take.  Alternative medications include (please refer to member's formulary): generic pantoprazole , generic rabeprazole, generic dexlansoprazole , etc.  Please note: generic dexlansoprazole  requires a separate prior authorization request.  PA #/Case ID/Reference #: [id number]

## 2024-05-13 NOTE — Telephone Encounter (Signed)
Dr Pyrtle, please advise.... 

## 2024-05-13 NOTE — Telephone Encounter (Signed)
 Pharmacy Patient Advocate Encounter  Received notification from [facility] that Prior Authorization for Dexlansoprazole  60MG  dr capsules has been DENIED.  Full denial letter will be uploaded to the media tab. See denial reason below.  This medication is covered when two over-the-counter (OTC) generic proton pump inhibitors (such as omeprazole , lansoprazole, etc) have been tried and did not work, or when the member cannot take all other OTC generic proton pump inhibitor medications.   The member must have also tried pantoprazole and did not work, or was not tolerated.   In this case, only one of the alternative medications have been tried, Omeprazole  Also, the member has not tried pantoprazole.   PA #/Case ID/Reference #: [id number]

## 2024-05-13 NOTE — Telephone Encounter (Signed)
 Please let patient know that Victoria [name] Exelon is not being approved because she has not tried pantoprazole Thus we can try pantoprazole 40 mg twice daily AC she can let us  know if this is effective for Victoria [name] GERD and upper GI dyspeptic symptoms

## 2024-05-16 ENCOUNTER — Other Ambulatory Visit: Payer: Self-pay | Admitting: Physician Assistant

## 2024-05-16 ENCOUNTER — Other Ambulatory Visit: Payer: Self-pay | Admitting: Internal Medicine

## 2024-05-16 DIAGNOSIS — F411 Generalized anxiety disorder: Secondary | ICD-10-CM

## 2024-05-16 MED ORDER — PANTOPRAZOLE SODIUM 40 MG PO TBEC: 40.0000 mg | DELAYED_RELEASE_TABLET | Freq: Two times a day (BID) | ORAL | 0 refills | Status: DC

## 2024-05-16 NOTE — Telephone Encounter (Signed)
 Patient advised that insurance has denied dexlansoprazole  until she has tried pantoprazole . Rx for pantoprazole  twice daily sent to pharmacy and patient advised.

## 2024-05-30 DIAGNOSIS — M84375A Stress fracture, left foot, initial encounter for fracture: Secondary | ICD-10-CM | POA: Diagnosis not present

## 2024-05-30 DIAGNOSIS — M722 Plantar fascial fibromatosis: Secondary | ICD-10-CM | POA: Diagnosis not present

## 2024-05-30 DIAGNOSIS — M6289 Other specified disorders of muscle: Secondary | ICD-10-CM | POA: Diagnosis not present

## 2024-05-31 ENCOUNTER — Other Ambulatory Visit: Payer: Self-pay | Admitting: Physician Assistant

## 2024-05-31 ENCOUNTER — Other Ambulatory Visit: Payer: Self-pay | Admitting: Gynecologic Oncology

## 2024-05-31 DIAGNOSIS — R1012 Left upper quadrant pain: Secondary | ICD-10-CM

## 2024-05-31 DIAGNOSIS — M5136 Other intervertebral disc degeneration, lumbar region with discogenic back pain only: Secondary | ICD-10-CM

## 2024-06-06 ENCOUNTER — Encounter (HOSPITAL_COMMUNITY)

## 2024-06-06 ENCOUNTER — Encounter (HOSPITAL_COMMUNITY): Payer: Self-pay

## 2024-06-10 ENCOUNTER — Other Ambulatory Visit: Payer: Self-pay | Admitting: Physician Assistant

## 2024-06-10 DIAGNOSIS — R1012 Left upper quadrant pain: Secondary | ICD-10-CM

## 2024-06-16 ENCOUNTER — Telehealth: Payer: Self-pay

## 2024-06-16 NOTE — Telephone Encounter (Signed)
 Patient walked in and dropped off patient assistance forms from [place] [employer] for Dexilant . Forms have been placed on Dr. Pamula desk to be signed.

## 2024-06-20 NOTE — Telephone Encounter (Signed)
 Forms for Victoria [name] help at [employer] patient assistance have been faxed and received fax confirmation.

## 2024-06-21 NOTE — Telephone Encounter (Signed)
 Received fax from [employer] patient assistance informing us  that the patient has approved until 06/20/25 and they will ship the patient her medication. Called patient and informed her of the approval.

## 2024-06-23 ENCOUNTER — Other Ambulatory Visit: Payer: Self-pay | Admitting: Internal Medicine

## 2024-06-30 DIAGNOSIS — M722 Plantar fascial fibromatosis: Secondary | ICD-10-CM | POA: Diagnosis not present

## 2024-06-30 DIAGNOSIS — M84375A Stress fracture, left foot, initial encounter for fracture: Secondary | ICD-10-CM | POA: Diagnosis not present

## 2024-06-30 DIAGNOSIS — M6289 Other specified disorders of muscle: Secondary | ICD-10-CM | POA: Diagnosis not present

## 2024-07-01 ENCOUNTER — Other Ambulatory Visit: Payer: Self-pay | Admitting: Physician Assistant

## 2024-07-01 DIAGNOSIS — L719 Rosacea, unspecified: Secondary | ICD-10-CM

## 2024-07-05 ENCOUNTER — Ambulatory Visit: Admitting: Internal Medicine

## 2024-07-05 ENCOUNTER — Encounter: Payer: Self-pay | Admitting: Internal Medicine

## 2024-07-07 ENCOUNTER — Telehealth: Payer: Self-pay | Admitting: Internal Medicine

## 2024-07-07 DIAGNOSIS — M25572 Pain in left ankle and joints of left foot: Secondary | ICD-10-CM | POA: Diagnosis not present

## 2024-07-07 NOTE — Telephone Encounter (Signed)
 Pt called and states she had an episode on Sunday where she had all of this bloating/pressure, she ate gas-x trying to help it go away. She ate dinner and then for the next 3 hours she vomited until she had dry heaves. She took one of her husbands zofran  and things seemed to settle down. Since that time the nausea comes and goes. Pt scheduled to see Dr. Albertus 11/18 at 2:30pm. Pt aware of appt. Offered pt a sooner appt with midlevel but she really wants to wait to see Dr. Albertus.

## 2024-07-07 NOTE — Telephone Encounter (Signed)
 Inbound call from patient stating she sent a MyChart message but hasn't heard back. Patient would like to be advised on what to do since she's been experiencing some bloating/gas and ends up throwing up and this is something new for her. Patient did not want to schedule with PA since she wanted to see Dr.Pyrtle and be advised by him or be told by him which PA to see in office.  Patient requesting a call back  Please advise  Thank you

## 2024-07-08 DIAGNOSIS — M722 Plantar fascial fibromatosis: Secondary | ICD-10-CM | POA: Diagnosis not present

## 2024-07-11 DIAGNOSIS — M6281 Muscle weakness (generalized): Secondary | ICD-10-CM | POA: Diagnosis not present

## 2024-07-11 DIAGNOSIS — M79672 Pain in left foot: Secondary | ICD-10-CM | POA: Diagnosis not present

## 2024-07-11 DIAGNOSIS — M25675 Stiffness of left foot, not elsewhere classified: Secondary | ICD-10-CM | POA: Diagnosis not present

## 2024-07-18 NOTE — Telephone Encounter (Signed)
 Pt has an appt o 11/18 with Dr. Junella. She reports her symptoms are happening more frequently and she wondered if she could be seen sooner with Dr. Albertus. Let her know there were no sooner appts. States she continues to have severe bloating, trapped gas, nausea and vomiting. She wonders if her sibo is coming back. She has a script of xifaxan  at home and wants to know if she should take it but does not want to mess up any testing Dr. Albertus may order. Please advise if pt should start xifaxan  that she has on hand.

## 2024-07-18 NOTE — Telephone Encounter (Signed)
 Patient's states her symptoms are occurring more frequently. Requesting to discuss previous recommendations further. Please advise, thank you

## 2024-07-20 DIAGNOSIS — M6281 Muscle weakness (generalized): Secondary | ICD-10-CM | POA: Diagnosis not present

## 2024-07-20 DIAGNOSIS — M79672 Pain in left foot: Secondary | ICD-10-CM | POA: Diagnosis not present

## 2024-07-20 DIAGNOSIS — M25675 Stiffness of left foot, not elsewhere classified: Secondary | ICD-10-CM | POA: Diagnosis not present

## 2024-07-20 NOTE — Telephone Encounter (Signed)
 I responded directly to the patient by MyChart message

## 2024-07-21 ENCOUNTER — Encounter: Payer: Self-pay | Admitting: Cardiology

## 2024-07-21 ENCOUNTER — Ambulatory Visit: Attending: Cardiology | Admitting: Cardiology

## 2024-07-21 VITALS — BP 114/78 | HR 85 | Ht 63.0 in | Wt 134.4 lb

## 2024-07-21 DIAGNOSIS — R002 Palpitations: Secondary | ICD-10-CM

## 2024-07-21 DIAGNOSIS — Z79899 Other long term (current) drug therapy: Secondary | ICD-10-CM | POA: Diagnosis not present

## 2024-07-21 DIAGNOSIS — I471 Supraventricular tachycardia, unspecified: Secondary | ICD-10-CM

## 2024-07-21 DIAGNOSIS — I700 Atherosclerosis of aorta: Secondary | ICD-10-CM

## 2024-07-21 MED ORDER — ROSUVASTATIN CALCIUM 5 MG PO TABS: 5.0000 mg | ORAL_TABLET | Freq: Every day | ORAL | 3 refills | Status: AC

## 2024-07-21 MED ORDER — DILTIAZEM HCL 30 MG PO TABS: 30.0000 mg | ORAL_TABLET | Freq: Two times a day (BID) | ORAL | 2 refills | Status: DC

## 2024-07-21 NOTE — Telephone Encounter (Signed)
 Noted

## 2024-07-21 NOTE — Patient Instructions (Signed)
 Medication Instructions:   START Crestor 5 mg daily  Labwork: Fasting Lipid, LFT's in 6 months  Testing/Procedures: None today  Follow-Up: 1 year   Any Other Special Instructions Will Be Listed Below (If Applicable).  If you need a refill on your cardiac medications before your next appointment, please call your pharmacy.

## 2024-07-21 NOTE — Progress Notes (Signed)
    Cardiology Office Note  Date: 07/21/2024   ID: Lynasia, Meloche [birth year], MRN [id number]  History of Present Illness: Victoria [name] is a 63 y.o. female last seen in September 2024.  She is here for a follow-up visit.  Reports intermittent and fairly sporadic sense of fast heartbeat.  She has checked ECGs with her Apple watch and I reviewed these today, all consistent with sinus rhythm.  She has had no associated syncope.  Unrelated to the symptoms she continues to have episodes of vertigo when she turns her head abruptly particularly when in bed most consistent with vestibular etiology.  We discussed her lipid panel obtained by OB/GYN earlier in the year.  She has a family history of heart disease in both parents.  LDL 110 and HDL 64 with LDL/HDL ratio of 1.7 in favorable range.  Estimated 10-year CVD risk is low.  On the other hand, she did have a CT of the abdomen and pelvis done in May which mentioned scattered aortic atherosclerosis.  Based on this, statin therapy would be recommended.  I reviewed her medications, she continues on Cardizem  30 mg twice daily.  I reviewed her ECG today which shows normal sinus rhythm.  Physical Exam: VS:  BP 114/78   Pulse 85   Ht 5' 3 (1.6 m)   Wt 134 lb 6.4 oz (61 kg)   SpO2 99%   BMI 23.81 kg/m , BMI Body mass index is 23.81 kg/m.  Wt Readings from Last 3 Encounters:  07/21/24 134 lb 6.4 oz (61 kg)  02/17/24 133 lb (60.3 kg)  02/05/24 133 lb 6.4 oz (60.5 kg)    General: Patient appears comfortable at rest. HEENT: Conjunctiva and lids normal. Neck: Supple, no elevated JVP or carotid bruits. Lungs: Clear to auscultation, nonlabored breathing at rest. Cardiac: Regular rate and rhythm, no S3 or significant systolic murmur, no pericardial rub. Extremities: No pitting edema.  ECG:  An ECG dated 06/03/2023 was personally reviewed today and demonstrated:  Sinus tachycardia with nonspecific ST changes.  Labwork: January 2025: Cholesterol  194, glycerides 115, HDL 64, LDL 110 12/31/2023: ALT 21; AST 19; BUN 14; Creatinine, Ser 0.75; Hemoglobin 14.9; Platelets 248; Potassium 3.6; Sodium 139   Other Studies Reviewed Today:  No interval cardiac testing for review today.  Assessment and Plan:  1.  History of palpitations with previously documented PVCs corresponding to symptoms and also a more remote history of PSVT.  No escalating symptoms.  I reviewed her ECGs obtained by Apple watch all consistent with sinus rhythm.  Her ECG is normal today as well.  Plan to continue observation on Cardizem  30 mg twice daily which she has tolerated.   2.  Intermittent dizziness and vertigo, possibly benign positional vertigo.  Plans to discuss further with PT.  3.  Incidentally noted aortic atherosclerosis by abdominal and pelvic CT in May.  LDL 110 and HDL 64.  She does have family history of heart disease in both parents.  Would recommend Crestor 5 mg daily.  Check FLP and LFTs in 6 months.  Would aim for LDL under 70 if possible.  Otherwise continue regular diet and exercise.  Disposition:  Follow up 1 year.  Signed, Jayson JUDITHANN Sierras, M.D., F.A.C.C. [facility]

## 2024-07-22 ENCOUNTER — Encounter: Payer: Self-pay | Admitting: Cardiology

## 2024-07-22 ENCOUNTER — Telehealth: Payer: Self-pay

## 2024-07-22 ENCOUNTER — Other Ambulatory Visit: Payer: Self-pay

## 2024-07-22 DIAGNOSIS — M79672 Pain in left foot: Secondary | ICD-10-CM | POA: Diagnosis not present

## 2024-07-22 DIAGNOSIS — M25675 Stiffness of left foot, not elsewhere classified: Secondary | ICD-10-CM | POA: Diagnosis not present

## 2024-07-22 DIAGNOSIS — M25561 Pain in right knee: Secondary | ICD-10-CM | POA: Diagnosis not present

## 2024-07-22 DIAGNOSIS — M1711 Unilateral primary osteoarthritis, right knee: Secondary | ICD-10-CM | POA: Diagnosis not present

## 2024-07-22 DIAGNOSIS — Z79899 Other long term (current) drug therapy: Secondary | ICD-10-CM

## 2024-07-22 DIAGNOSIS — M6281 Muscle weakness (generalized): Secondary | ICD-10-CM | POA: Diagnosis not present

## 2024-07-22 NOTE — Telephone Encounter (Signed)
 Patient requested lipid panel now and again in 6 months-ok per Dr.McDowell

## 2024-07-25 DIAGNOSIS — M79672 Pain in left foot: Secondary | ICD-10-CM | POA: Diagnosis not present

## 2024-07-25 DIAGNOSIS — M25675 Stiffness of left foot, not elsewhere classified: Secondary | ICD-10-CM | POA: Diagnosis not present

## 2024-07-25 DIAGNOSIS — M6281 Muscle weakness (generalized): Secondary | ICD-10-CM | POA: Diagnosis not present

## 2024-07-26 ENCOUNTER — Ambulatory Visit: Admitting: Internal Medicine

## 2024-07-26 ENCOUNTER — Encounter: Payer: Self-pay | Admitting: Internal Medicine

## 2024-07-26 ENCOUNTER — Ambulatory Visit: Payer: Self-pay | Admitting: Cardiology

## 2024-07-26 VITALS — BP 116/70 | HR 87 | Ht 63.0 in | Wt 134.0 lb

## 2024-07-26 DIAGNOSIS — K638219 Small intestinal bacterial overgrowth, unspecified: Secondary | ICD-10-CM | POA: Diagnosis not present

## 2024-07-26 DIAGNOSIS — R1084 Generalized abdominal pain: Secondary | ICD-10-CM

## 2024-07-26 DIAGNOSIS — R140 Abdominal distension (gaseous): Secondary | ICD-10-CM

## 2024-07-26 DIAGNOSIS — K219 Gastro-esophageal reflux disease without esophagitis: Secondary | ICD-10-CM | POA: Diagnosis not present

## 2024-07-26 DIAGNOSIS — K648 Other hemorrhoids: Secondary | ICD-10-CM

## 2024-07-26 LAB — LIPID PANEL
Chol/HDL Ratio: 3.1 ratio (ref 0.0–4.4)
Cholesterol, Total: 191 mg/dL (ref 100–199)
HDL: 62 mg/dL (ref >39)
LDL Chol Calc (NIH): 101 mg/dL — ABNORMAL HIGH (ref 0–99)
Triglycerides: 164 mg/dL — ABNORMAL HIGH (ref 0–149)
VLDL Cholesterol Cal: 28 mg/dL (ref 5–40)

## 2024-07-26 MED ORDER — ONDANSETRON 4 MG PO TBDP: 4.0000 mg | ORAL_TABLET | Freq: Two times a day (BID) | ORAL | 1 refills | Status: AC | PRN

## 2024-07-26 MED ORDER — RIFAXIMIN 550 MG PO TABS: 550.0000 mg | ORAL_TABLET | Freq: Three times a day (TID) | ORAL | 3 refills | Status: AC

## 2024-07-26 MED ORDER — DICYCLOMINE HCL 20 MG PO TABS: 20.0000 mg | ORAL_TABLET | Freq: Three times a day (TID) | ORAL | 11 refills | Status: AC

## 2024-07-26 NOTE — Patient Instructions (Signed)
 You have been given a low fod-map diet to follow.   We have sent the following medications to your pharmacy for you to pick up at your convenience: xifaxan , zofran  and dicyclomine .   Continue Dexilant  daily.   Stop the probiotic.   _______________________________________________________  If your blood pressure at your visit was 140/90 or greater, please contact your primary care physician to follow up on this.  _______________________________________________________  If you are age 61 or older, your body mass index should be between 23-30. Your Body mass index is 23.74 kg/m. If this is out of the aforementioned range listed, please consider follow up with your Primary Care Provider.  If you are age 61 or younger, your body mass index should be between 19-25. Your Body mass index is 23.74 kg/m. If this is out of the aformentioned range listed, please consider follow up with your Primary Care Provider.   ________________________________________________________  The [facility] providers would like to encourage you to use MYCHART to communicate with providers for non-urgent requests or questions.  Due to long hold times on the telephone, sending your provider a message by [facility] may be a faster and more efficient way to get a response.  Please allow 48 business hours for a response.  Please remember that this is for non-urgent requests.  _______________________________________________________  [facility] is using a team-based approach to care.  Your team is made up of your doctor and two to three APPS. Our APPS (Nurse Practitioners and Physician Assistants) work with your physician to ensure care continuity for you. They are fully qualified to address your health concerns and develop a treatment plan. They communicate directly with your gastroenterologist to care for you. Seeing the Advanced Practice Practitioners on your physician's team can help you by facilitating care more  promptly, often allowing for earlier appointments, access to diagnostic testing, procedures, and other specialty referrals.

## 2024-07-27 ENCOUNTER — Encounter: Payer: Self-pay | Admitting: Internal Medicine

## 2024-07-27 ENCOUNTER — Telehealth: Payer: Self-pay

## 2024-07-27 NOTE — Telephone Encounter (Signed)
 Pharmacy Patient Advocate Encounter   Received notification from CoverMyMeds that prior authorization for Xifaxan  550MG  tablets is required/requested.   Insurance verification completed.   The patient is insured through [facility].   Per test claim: PA required; PA submitted to above mentioned insurance via Latent Key/confirmation #/EOC [id number] Status is pending

## 2024-07-27 NOTE — Progress Notes (Signed)
 Subjective:    Patient ID: Victoria [name], female    DOB: 07/18/1961, 63 y.o.   MRN: [id number]  HPI Anntonette Madewell is a 63 year old female with a history of SIBO, GERD diverticulosis, probable ischemic colitis, hypertension who presents for follow-up.  She is here today with her husband.  Abdominal bloating and distension - Severe bloating and gas, particularly after lunch and worsening throughout the day, especially after dinner - Abdominal distension and tightness under the breasts, causing difficulty breathing - Passing gas provides relief; inability to pass gas worsens symptoms  Abdominal pain and nausea - Worsening abdominal pain and nausea associated with bloating - Two weeks ago, experienced a severe episode after dinner with violent vomiting for four hours - Started Xifaxan  four days ago due to worsening nausea, bloating, and gas  Altered bowel habits - Increased frequency of diarrhea, sometimes with blood - Burning sensation in the colon accompanied by chills - Dicyclomine  20 mg taken as needed for burning and chills  Dietary modifications and food triggers - Significant dietary changes, including elimination of dairy - Lactaid with dairy consumption was not helpful - Symptoms not consistently related to specific foods and occur even when avoiding known triggers  Impact on quality of life - More bad days than good - Significant distress, describing herself as 'miserable in life right now' - Seeking answers and relief from symptoms  Relevant diagnostic studies and surgical history - History of positive SIBO breath test - Colonoscopy showing internal hemorrhoids and diverticulosis - CT scan with no acute findings - MR enterography within normal limits but limited by motion - Salpingo-oophorectomy in April 2025   Review of Systems As per HPI, otherwise negative  Current Medications, Allergies, Past Medical History, Past Surgical History, Family History and Social  History were reviewed in [employer] record.    Objective:   Physical Exam BP 116/70   Pulse 87   Ht 5' 3 (1.6 m)   Wt 134 lb (60.8 kg)   SpO2 98%   BMI 23.74 kg/m  Gen: awake, alert, NAD HEENT: anicteric  Ext: no c/c/e Neuro: nonfocal   LABS Fecal calprotectin: Normal (10/30/2023) CA125: Normal CA19.9: Normal CEA: Normal Celiac panel: Negative Alpha gal panel: Negative  RADIOLOGY CT scan abdomen and pelvis: No acute findings (02/05/2024) MR enterography: Limited by breath and peristaltic motion, within normal limits, fluid-filled colon nearly to rectum, simple left ovarian cyst, right ovarian cyst (11/03/2023)  DIAGNOSTIC Colonoscopy: Internal hemorrhoids, diverticulosis in left colon, normal terminal ileum and colonic mucosa (12/15/2023)  SIBO breath test: Positive = methane normal, hydrogen elevated     Assessment & Plan:   Small intestinal bacterial overgrowth (SIBO) with associated abdominal bloating, pain, and nausea SIBO confirmed by breath test with high hydrogen production. Symptoms include severe bloating, abdominal pain, nausea, and vomiting, worsening postprandially. Rifaximin  initiated.  Favorable response earlier this year.  Probiotics discontinued. Low FODMAP diet recommended. - Continue rifaximin  550 mg mg TID for 14 days. - Discontinued probiotics. - Implement low FODMAP diet. - Ordered fecal elastase test. - Prescribed Zofran  4 mg every 12 hours PRN for nausea. - Provided low FODMAP diet handout.  Irritable bowel syndrome (IBS) IBS with abdominal bloating, pain, and altered bowel habits. Dicyclomine  effective for cramping and burning. Celebrex preferred for pain management over ibuprofen . - Continue dicyclomine  20 mg PRN. - Consider smaller, more frequent meals.  Internal hemorrhoids Occasional blood passage likely due to hemorrhoids. - Monitor for symptoms.  GERD Stable -Continue Dexilant   60 mg daily  45 minutes  total spent today including patient facing time, coordination of care, reviewing medical history/procedures/pertinent radiology studies, and documentation of the encounter.

## 2024-07-28 ENCOUNTER — Other Ambulatory Visit

## 2024-07-28 DIAGNOSIS — M6281 Muscle weakness (generalized): Secondary | ICD-10-CM | POA: Diagnosis not present

## 2024-07-28 DIAGNOSIS — R1084 Generalized abdominal pain: Secondary | ICD-10-CM

## 2024-07-28 DIAGNOSIS — M25675 Stiffness of left foot, not elsewhere classified: Secondary | ICD-10-CM | POA: Diagnosis not present

## 2024-07-28 DIAGNOSIS — K638219 Small intestinal bacterial overgrowth, unspecified: Secondary | ICD-10-CM | POA: Diagnosis not present

## 2024-07-28 DIAGNOSIS — R140 Abdominal distension (gaseous): Secondary | ICD-10-CM | POA: Diagnosis not present

## 2024-07-28 DIAGNOSIS — M79672 Pain in left foot: Secondary | ICD-10-CM | POA: Diagnosis not present

## 2024-07-29 ENCOUNTER — Telehealth: Payer: Self-pay

## 2024-07-29 NOTE — Telephone Encounter (Signed)
 See note from pt requesting different antibiotic as xifaxan  was denied.

## 2024-07-29 NOTE — Telephone Encounter (Signed)
 Victoria [name]

## 2024-07-29 NOTE — Telephone Encounter (Signed)
 Noted

## 2024-07-29 NOTE — Telephone Encounter (Signed)
 Pharmacy Patient Advocate Encounter  Received notification from [facility] that Prior Authorization for Xifaxan  550MG  tablets has been DENIED.  Full denial letter will be uploaded to the media tab. See denial reason below.  This medication may be approved for the treatment of these conditions when the member meets criteria specific to their condition:  Irritable bowel syndrome with severe diarrhea (many bowel movements with loose stools), with symptoms for at least 6 months Traveler's diarrhea (diarrhea that develops after traveling to an area) caused by noninvasive strains of Escherichia coli (a type of bacteria) Risk of recurrent overt hepatic encephalopathy (loss of brain function due to liver damage)  Also the allowed amount for irritable bowel syndrome with severe diarrhea (many bowel movements with loose stools) is 126 pills per 365 days. This medication is not covered for long term or daily use for this condition.  In this case, the member is not being treated for one of these conditions. Also, the dose requested is more than the highest dose recommended by the Food and Drug Administration (FDA), and adequate medical information to support the requested amount was not sent in for review (including detailed information on other medications and doses used to treat the member's condition, how long each of those medications was taken, etc)  PA #/Case ID/Reference #: [id number]

## 2024-07-29 NOTE — Telephone Encounter (Signed)
 Linda, pls check with Sonny and see if we have Xifaxan  samples to give patient. THX.

## 2024-07-30 ENCOUNTER — Other Ambulatory Visit: Payer: Self-pay | Admitting: Physician Assistant

## 2024-07-30 DIAGNOSIS — M5136 Other intervertebral disc degeneration, lumbar region with discogenic back pain only: Secondary | ICD-10-CM

## 2024-08-01 DIAGNOSIS — M6281 Muscle weakness (generalized): Secondary | ICD-10-CM | POA: Diagnosis not present

## 2024-08-01 DIAGNOSIS — M79672 Pain in left foot: Secondary | ICD-10-CM | POA: Diagnosis not present

## 2024-08-01 DIAGNOSIS — M25675 Stiffness of left foot, not elsewhere classified: Secondary | ICD-10-CM | POA: Diagnosis not present

## 2024-08-04 LAB — PANCREATIC ELASTASE, FECAL: Pancreatic Elastase-1, Stool: >800 ug/g (ref >200)

## 2024-08-08 ENCOUNTER — Ambulatory Visit: Payer: Self-pay | Admitting: Internal Medicine

## 2024-08-08 DIAGNOSIS — M722 Plantar fascial fibromatosis: Secondary | ICD-10-CM | POA: Diagnosis not present

## 2024-08-09 DIAGNOSIS — M6281 Muscle weakness (generalized): Secondary | ICD-10-CM | POA: Diagnosis not present

## 2024-08-09 DIAGNOSIS — M25675 Stiffness of left foot, not elsewhere classified: Secondary | ICD-10-CM | POA: Diagnosis not present

## 2024-08-09 DIAGNOSIS — M79672 Pain in left foot: Secondary | ICD-10-CM | POA: Diagnosis not present

## 2024-08-13 ENCOUNTER — Other Ambulatory Visit: Payer: Self-pay | Admitting: Internal Medicine

## 2024-08-16 DIAGNOSIS — M25675 Stiffness of left foot, not elsewhere classified: Secondary | ICD-10-CM | POA: Diagnosis not present

## 2024-08-16 DIAGNOSIS — M79672 Pain in left foot: Secondary | ICD-10-CM | POA: Diagnosis not present

## 2024-08-16 DIAGNOSIS — M6281 Muscle weakness (generalized): Secondary | ICD-10-CM | POA: Diagnosis not present

## 2024-08-17 ENCOUNTER — Encounter: Payer: Self-pay | Admitting: Internal Medicine

## 2024-08-18 ENCOUNTER — Other Ambulatory Visit: Payer: Self-pay | Admitting: Internal Medicine

## 2024-08-19 DIAGNOSIS — M79672 Pain in left foot: Secondary | ICD-10-CM | POA: Diagnosis not present

## 2024-08-19 DIAGNOSIS — M25675 Stiffness of left foot, not elsewhere classified: Secondary | ICD-10-CM | POA: Diagnosis not present

## 2024-08-19 DIAGNOSIS — M6281 Muscle weakness (generalized): Secondary | ICD-10-CM | POA: Diagnosis not present

## 2024-08-26 MED ORDER — METRONIDAZOLE 250 MG PO TABS: 250.0000 mg | ORAL_TABLET | Freq: Three times a day (TID) | ORAL | 0 refills | Status: AC

## 2024-09-02 ENCOUNTER — Other Ambulatory Visit: Payer: Self-pay | Admitting: Cardiology

## 2024-09-05 ENCOUNTER — Other Ambulatory Visit: Payer: Self-pay | Admitting: *Deleted

## 2024-09-05 ENCOUNTER — Encounter: Payer: Self-pay | Admitting: Cardiology

## 2024-09-05 ENCOUNTER — Telehealth: Payer: Self-pay | Admitting: Cardiology

## 2024-09-05 MED ORDER — DILTIAZEM HCL 30 MG PO TABS: 30.0000 mg | ORAL_TABLET | Freq: Three times a day (TID) | ORAL | 3 refills | Status: AC

## 2024-09-05 NOTE — Telephone Encounter (Signed)
 Replied via mychart.

## 2024-09-05 NOTE — Telephone Encounter (Signed)
 Cardizem  was recently filled per provider most recent dictation which notes it to be twice a day.  Please advise if ok to fill as he is requesting.

## 2024-09-05 NOTE — Telephone Encounter (Signed)
 Pt c/o medication issue:  1. Name of Medication: diltiazem  (CARDIZEM ) 30 MG tablet   2. How are you currently taking this medication (dosage and times per day)?   3. Are you having a reaction (difficulty breathing--STAT)? No   4. What is your medication issue? Pt calling about her diltiazem  dosage. The Rx is currently written for 2x daily, but should be 3x daily. She has messaged with the nurse but called as she felt they were having a misunderstanding. Please advise.

## 2024-09-10 ENCOUNTER — Other Ambulatory Visit: Payer: Self-pay | Admitting: Physician Assistant

## 2024-09-10 DIAGNOSIS — J45909 Unspecified asthma, uncomplicated: Secondary | ICD-10-CM

## 2024-10-11 ENCOUNTER — Encounter: Payer: Self-pay | Admitting: Internal Medicine
